# Patient Record
Sex: Female | Born: 1935
Health system: Southern US, Community
[De-identification: ages and names within clinical notes are randomized; demographics above are authoritative.]

## PROBLEM LIST (undated history)

## (undated) DIAGNOSIS — I441 Atrioventricular block, second degree: Secondary | ICD-10-CM

## (undated) DIAGNOSIS — I2510 Atherosclerotic heart disease of native coronary artery without angina pectoris: Secondary | ICD-10-CM

## (undated) DIAGNOSIS — I4891 Unspecified atrial fibrillation: Secondary | ICD-10-CM

## (undated) DIAGNOSIS — S72009A Fracture of unspecified part of neck of unspecified femur, initial encounter for closed fracture: Secondary | ICD-10-CM

## (undated) DIAGNOSIS — M1990 Unspecified osteoarthritis, unspecified site: Secondary | ICD-10-CM

## (undated) DIAGNOSIS — I10 Essential (primary) hypertension: Secondary | ICD-10-CM

## (undated) DIAGNOSIS — K529 Noninfective gastroenteritis and colitis, unspecified: Secondary | ICD-10-CM

## (undated) DIAGNOSIS — E119 Type 2 diabetes mellitus without complications: Secondary | ICD-10-CM

## (undated) DIAGNOSIS — K439 Ventral hernia without obstruction or gangrene: Secondary | ICD-10-CM

## (undated) DIAGNOSIS — K5730 Diverticulosis of large intestine without perforation or abscess without bleeding: Secondary | ICD-10-CM

## (undated) DIAGNOSIS — E785 Hyperlipidemia, unspecified: Secondary | ICD-10-CM

## (undated) DIAGNOSIS — E039 Hypothyroidism, unspecified: Secondary | ICD-10-CM

## (undated) DIAGNOSIS — C189 Malignant neoplasm of colon, unspecified: Secondary | ICD-10-CM

## (undated) HISTORY — DX: Hypothyroidism, unspecified: E03.9

## (undated) HISTORY — DX: Atrioventricular block, second degree: I44.1

## (undated) HISTORY — DX: Ventral hernia without obstruction or gangrene: K43.9

## (undated) HISTORY — DX: Atherosclerotic heart disease of native coronary artery without angina pectoris: I25.10

## (undated) HISTORY — DX: Malignant neoplasm of colon, unspecified: C18.9

## (undated) HISTORY — DX: Noninfective gastroenteritis and colitis, unspecified: K52.9

## (undated) HISTORY — DX: Type 2 diabetes mellitus without complications: E11.9

## (undated) HISTORY — DX: Unspecified atrial fibrillation: I48.91

## (undated) HISTORY — DX: Hyperlipidemia, unspecified: E78.5

## (undated) HISTORY — DX: Essential (primary) hypertension: I10

## (undated) HISTORY — PX: APPENDECTOMY: SHX54

## (undated) HISTORY — DX: Diverticulosis of large intestine without perforation or abscess without bleeding: K57.30

## (undated) HISTORY — DX: Unspecified osteoarthritis, unspecified site: M19.90

## (undated) HISTORY — PX: ROTATOR CUFF REPAIR: SHX139

---

## 1997-08-22 ENCOUNTER — Ambulatory Visit (HOSPITAL_COMMUNITY): Admission: RE | Admit: 1997-08-22 | Discharge: 1997-08-23 | Payer: Self-pay | Admitting: Cardiology

## 1997-08-22 HISTORY — PX: CARDIAC CATHETERIZATION: SHX172

## 1999-12-25 ENCOUNTER — Encounter: Payer: Self-pay | Admitting: Internal Medicine

## 1999-12-25 ENCOUNTER — Emergency Department (HOSPITAL_COMMUNITY): Admission: EM | Admit: 1999-12-25 | Discharge: 1999-12-25 | Payer: Self-pay | Admitting: Emergency Medicine

## 2000-01-10 ENCOUNTER — Encounter: Payer: Self-pay | Admitting: Orthopedic Surgery

## 2000-01-10 ENCOUNTER — Encounter: Admission: RE | Admit: 2000-01-10 | Discharge: 2000-01-10 | Payer: Self-pay | Admitting: Orthopedic Surgery

## 2000-02-13 ENCOUNTER — Encounter: Payer: Self-pay | Admitting: Orthopedic Surgery

## 2000-02-20 ENCOUNTER — Inpatient Hospital Stay (HOSPITAL_COMMUNITY): Admission: RE | Admit: 2000-02-20 | Discharge: 2000-02-22 | Payer: Self-pay | Admitting: Orthopedic Surgery

## 2002-06-23 ENCOUNTER — Ambulatory Visit (HOSPITAL_COMMUNITY): Admission: RE | Admit: 2002-06-23 | Discharge: 2002-06-23 | Payer: Self-pay | Admitting: Endocrinology

## 2005-09-10 ENCOUNTER — Encounter: Payer: Self-pay | Admitting: Vascular Surgery

## 2005-09-10 ENCOUNTER — Ambulatory Visit (HOSPITAL_COMMUNITY): Admission: RE | Admit: 2005-09-10 | Discharge: 2005-09-10 | Payer: Self-pay | Admitting: Podiatry

## 2005-11-28 ENCOUNTER — Ambulatory Visit: Payer: Self-pay | Admitting: Gastroenterology

## 2005-12-04 ENCOUNTER — Ambulatory Visit: Payer: Self-pay | Admitting: Gastroenterology

## 2005-12-22 ENCOUNTER — Ambulatory Visit: Payer: Self-pay | Admitting: Gastroenterology

## 2005-12-29 ENCOUNTER — Ambulatory Visit: Payer: Self-pay | Admitting: Gastroenterology

## 2006-01-08 ENCOUNTER — Ambulatory Visit: Payer: Self-pay | Admitting: Gastroenterology

## 2006-01-15 ENCOUNTER — Encounter (INDEPENDENT_AMBULATORY_CARE_PROVIDER_SITE_OTHER): Payer: Self-pay | Admitting: Gastroenterology

## 2006-01-15 ENCOUNTER — Ambulatory Visit: Payer: Self-pay | Admitting: Gastroenterology

## 2006-02-09 HISTORY — PX: HEMICOLECTOMY: SHX854

## 2006-02-10 ENCOUNTER — Encounter (INDEPENDENT_AMBULATORY_CARE_PROVIDER_SITE_OTHER): Payer: Self-pay | Admitting: Specialist

## 2006-02-10 ENCOUNTER — Inpatient Hospital Stay (HOSPITAL_COMMUNITY): Admission: RE | Admit: 2006-02-10 | Discharge: 2006-02-15 | Payer: Self-pay | Admitting: General Surgery

## 2007-01-08 ENCOUNTER — Ambulatory Visit: Payer: Self-pay | Admitting: Gastroenterology

## 2007-02-17 ENCOUNTER — Ambulatory Visit: Payer: Self-pay | Admitting: Gastroenterology

## 2007-02-17 DIAGNOSIS — K5730 Diverticulosis of large intestine without perforation or abscess without bleeding: Secondary | ICD-10-CM | POA: Insufficient documentation

## 2007-05-04 ENCOUNTER — Inpatient Hospital Stay (HOSPITAL_COMMUNITY): Admission: RE | Admit: 2007-05-04 | Discharge: 2007-05-10 | Payer: Self-pay | Admitting: Orthopedic Surgery

## 2007-07-30 ENCOUNTER — Encounter: Admission: RE | Admit: 2007-07-30 | Discharge: 2007-07-30 | Payer: Self-pay | Admitting: General Surgery

## 2007-08-10 DIAGNOSIS — K648 Other hemorrhoids: Secondary | ICD-10-CM | POA: Insufficient documentation

## 2007-08-10 DIAGNOSIS — E785 Hyperlipidemia, unspecified: Secondary | ICD-10-CM

## 2007-08-10 DIAGNOSIS — M1990 Unspecified osteoarthritis, unspecified site: Secondary | ICD-10-CM

## 2007-08-10 DIAGNOSIS — I10 Essential (primary) hypertension: Secondary | ICD-10-CM | POA: Insufficient documentation

## 2007-08-13 ENCOUNTER — Encounter: Admission: RE | Admit: 2007-08-13 | Discharge: 2007-08-13 | Payer: Self-pay | Admitting: Orthopedic Surgery

## 2007-09-14 ENCOUNTER — Inpatient Hospital Stay (HOSPITAL_COMMUNITY): Admission: AD | Admit: 2007-09-14 | Discharge: 2007-09-16 | Payer: Self-pay | Admitting: Orthopedic Surgery

## 2008-06-29 ENCOUNTER — Inpatient Hospital Stay (HOSPITAL_COMMUNITY): Admission: EM | Admit: 2008-06-29 | Discharge: 2008-07-07 | Payer: Self-pay | Admitting: Emergency Medicine

## 2008-06-29 ENCOUNTER — Encounter: Payer: Self-pay | Admitting: Cardiology

## 2008-06-29 ENCOUNTER — Ambulatory Visit: Payer: Self-pay | Admitting: Internal Medicine

## 2008-06-29 HISTORY — PX: TRANSTHORACIC ECHOCARDIOGRAM: SHX275

## 2008-07-03 HISTORY — PX: CORONARY ANGIOPLASTY: SHX604

## 2008-07-06 HISTORY — PX: PACEMAKER INSERTION: SHX728

## 2008-07-13 ENCOUNTER — Encounter: Payer: Self-pay | Admitting: Internal Medicine

## 2008-07-17 ENCOUNTER — Ambulatory Visit: Payer: Self-pay

## 2008-07-27 ENCOUNTER — Encounter (HOSPITAL_COMMUNITY): Admission: RE | Admit: 2008-07-27 | Discharge: 2008-10-25 | Payer: Self-pay | Admitting: Cardiology

## 2008-09-28 DIAGNOSIS — R55 Syncope and collapse: Secondary | ICD-10-CM

## 2008-09-28 DIAGNOSIS — I2510 Atherosclerotic heart disease of native coronary artery without angina pectoris: Secondary | ICD-10-CM | POA: Insufficient documentation

## 2008-09-28 DIAGNOSIS — K439 Ventral hernia without obstruction or gangrene: Secondary | ICD-10-CM | POA: Insufficient documentation

## 2008-09-28 DIAGNOSIS — Z9861 Coronary angioplasty status: Secondary | ICD-10-CM

## 2008-09-29 ENCOUNTER — Ambulatory Visit: Payer: Self-pay | Admitting: Internal Medicine

## 2008-11-20 ENCOUNTER — Telehealth: Payer: Self-pay | Admitting: Gastroenterology

## 2008-11-23 ENCOUNTER — Ambulatory Visit: Payer: Self-pay | Admitting: Gastroenterology

## 2008-11-23 DIAGNOSIS — R197 Diarrhea, unspecified: Secondary | ICD-10-CM

## 2008-11-23 LAB — CONVERTED CEMR LAB
ALT: 17 units/L (ref 0–35)
AST: 22 units/L (ref 0–37)
Basophils Absolute: 0 10*3/uL (ref 0.0–0.1)
Bilirubin, Direct: 0.1 mg/dL (ref 0.0–0.3)
Chloride: 103 meq/L (ref 96–112)
Eosinophils Absolute: 0.1 10*3/uL (ref 0.0–0.7)
Iron: 51 ug/dL (ref 42–145)
Lymphocytes Relative: 22.3 % (ref 12.0–46.0)
MCHC: 34.7 g/dL (ref 30.0–36.0)
Neutrophils Relative %: 66.6 % (ref 43.0–77.0)
Platelets: 244 10*3/uL (ref 150.0–400.0)
Potassium: 4 meq/L (ref 3.5–5.1)
RBC: 3.44 M/uL — ABNORMAL LOW (ref 3.87–5.11)
RDW: 14.5 % (ref 11.5–14.6)
Sodium: 138 meq/L (ref 135–145)
Total Bilirubin: 0.8 mg/dL (ref 0.3–1.2)
Transferrin: 286.7 mg/dL (ref 212.0–360.0)
Vitamin B-12: 319 pg/mL (ref 211–911)

## 2008-11-24 ENCOUNTER — Encounter: Payer: Self-pay | Admitting: Gastroenterology

## 2008-11-28 ENCOUNTER — Telehealth: Payer: Self-pay | Admitting: Gastroenterology

## 2008-11-30 ENCOUNTER — Telehealth: Payer: Self-pay | Admitting: Gastroenterology

## 2008-11-30 ENCOUNTER — Ambulatory Visit: Payer: Self-pay | Admitting: Internal Medicine

## 2008-12-01 ENCOUNTER — Encounter: Payer: Self-pay | Admitting: Gastroenterology

## 2008-12-01 ENCOUNTER — Ambulatory Visit: Payer: Self-pay | Admitting: Gastroenterology

## 2008-12-01 ENCOUNTER — Ambulatory Visit (HOSPITAL_COMMUNITY): Admission: RE | Admit: 2008-12-01 | Discharge: 2008-12-01 | Payer: Self-pay | Admitting: Gastroenterology

## 2008-12-05 ENCOUNTER — Telehealth: Payer: Self-pay | Admitting: Gastroenterology

## 2008-12-06 ENCOUNTER — Ambulatory Visit: Payer: Self-pay | Admitting: Gastroenterology

## 2008-12-06 ENCOUNTER — Telehealth: Payer: Self-pay | Admitting: Gastroenterology

## 2008-12-06 DIAGNOSIS — K5289 Other specified noninfective gastroenteritis and colitis: Secondary | ICD-10-CM

## 2008-12-07 ENCOUNTER — Telehealth: Payer: Self-pay | Admitting: Gastroenterology

## 2009-03-09 ENCOUNTER — Inpatient Hospital Stay (HOSPITAL_COMMUNITY): Admission: AD | Admit: 2009-03-09 | Discharge: 2009-03-15 | Payer: Self-pay | Admitting: General Surgery

## 2009-04-18 ENCOUNTER — Emergency Department (HOSPITAL_COMMUNITY): Admission: EM | Admit: 2009-04-18 | Discharge: 2009-04-18 | Payer: Self-pay | Admitting: Emergency Medicine

## 2009-04-25 ENCOUNTER — Emergency Department (HOSPITAL_COMMUNITY): Admission: EM | Admit: 2009-04-25 | Discharge: 2009-04-25 | Payer: Self-pay | Admitting: Emergency Medicine

## 2009-11-30 ENCOUNTER — Encounter (INDEPENDENT_AMBULATORY_CARE_PROVIDER_SITE_OTHER): Payer: Self-pay | Admitting: *Deleted

## 2010-01-17 ENCOUNTER — Ambulatory Visit: Payer: Self-pay | Admitting: Cardiology

## 2010-02-14 ENCOUNTER — Ambulatory Visit: Payer: Self-pay | Admitting: Internal Medicine

## 2010-06-13 NOTE — Assessment & Plan Note (Signed)
Summary: MDT PACER CHECK  Medications Added BENICAR 40 MG TABS (OLMESARTAN MEDOXOMIL) Take 1 tablet by mouth once daily      Allergies Added:   Current Medications (verified): 1)  Actos 15 Mg Tabs (Pioglitazone Hcl) .Marland Kitchen.. 1 By Mouth Once Daily 2)  Aspirin 81 Mg Tabs (Aspirin) .Marland Kitchen.. 1 Tablet By Mouth Once Daily 3)  Fish Oil   Oil (Fish Oil) .Marland Kitchen.. 1 By Mouth Once Daily 4)  Plavix 75 Mg Tabs (Clopidogrel Bisulfate) .... Take One Tablet By Mouth Daily 5)  Furosemide 40 Mg Tabs (Furosemide) .... Take 1 and 1/2  Tablet By Mouth Daily. 6)  Synthroid 112 Mcg Tabs (Levothyroxine Sodium) .Marland Kitchen.. 1 Tablet By Mouth Once Daily 7)  Nitroglycerin 0.4 Mg Subl (Nitroglycerin) .... One Tablet Under Tongue Every 5 Minutes As Needed For Chest Pain---May Repeat Times Three 8)  Simvastatin 40 Mg Tabs (Simvastatin) .... Take One Tablet By Mouth Daily At Bedtime 9)  Xifaxan 550mg   Tabs (Rifaximin) .... Take One By Mouth Two Times A Day 10)  Align   Caps (Misc Intestinal Flora Regulat) .... Take One Capsule By Mouth Daily 11)  Entocort Ec 3 Mg Xr24h-Cap (Budesonide) .... Take 3 Tabs Once A Day 12)  Benicar 40 Mg Tabs (Olmesartan Medoxomil) .... Take 1 Tablet By Mouth Once Daily  Allergies (verified): 1)  ! Motrin    PPM Specifications Following MD:  Hillis Range, MD     PPM Vendor:  Medtronic     PPM Model Number:  [id number]     PPM Serial Number:  [id number] PPM DOI:  07/06/2008     PPM Implanting MD:  Hillis Range, MD  Lead 1    Location: RA     DOI: 07/06/2008     Model #: [id number]     Serial #: [id number]     Status: active Lead 2    Location: RV     DOI: 07/06/2008     Model #: [id number]     Serial #: [id number]     Status: active  Magnet Response Rate:  BOL 85 ERI  65  Indications:  Mobitz I   PPM Follow Up Battery Voltage:  2.79 V     Battery Est. Longevity:  9 yrs       PPM Device Measurements Atrium  Amplitude: 5.60 mV, Impedance: 435 ohms, Threshold: 1.00 V at 0.40 msec Right Ventricle  Amplitude: 22.40  mV, Impedance: 576 ohms, Threshold: 1.00 V at 0.40 msec  Episodes MS Episodes:  8     Percent Mode Switch:  0.1%     Coumadin:  No Ventricular High Rate:  0     Atrial Pacing:  25.3%     Ventricular Pacing:  99.9%  Parameters Mode:  DDD     Lower Rate Limit:  60     Upper Rate Limit:  130 Paced AV Delay:  200     Sensed AV Delay:  180 Rate Response Parameters:  good heart rate historgram distribution Next Cardiology Appt Due:  08/12/2010 Tech Comments:  8 MODE SWITCHES--LONGEST WAS 5 HRS 49 MINUTES. -COUMADIN.  NORMAL DEVICE FUNCTION.  NO CHANGES MADE. ROV IN 6 MTHS W/JA. Vella Kohler  February 14, 2010 1:38 PM  Appended Document: MDT PACER CHECK given advanced age and colitis, I am reluctant to start coumadin for afib. We will discuss upon return.

## 2010-06-13 NOTE — Letter (Signed)
Summary: Device-Delinquent Check  [facility], Main Office  [address]   [place], [place] [address]   Phone: [phone]  Fax: [phone]     November 30, 2009 MRN: [id number]   [facility] [address] [place], [place]  [address]   Dear Ms. KETNER,  According to our records, you have not had your implanted device checked with Dr. Johney Frame in the recommended period of time.  We are unable to determine appropriate device function without checking your device on a regular basis.  Please call our office to schedule an appointment as soon as possible.  If you are having your device checked by another physician, please call us so that we may update our records.  Thank you,   [facility]

## 2010-06-13 NOTE — Cardiovascular Report (Signed)
Summary: Office Visit   Office Visit   Imported By: Roderic Ovens 02/21/2010 15:48:22  _____________________________________________________________________  External Attachment:    Type:   Image     Comment:   External Document

## 2010-07-31 ENCOUNTER — Encounter: Payer: Self-pay | Admitting: Internal Medicine

## 2010-08-12 ENCOUNTER — Ambulatory Visit (INDEPENDENT_AMBULATORY_CARE_PROVIDER_SITE_OTHER): Payer: Medicare Other | Admitting: Internal Medicine

## 2010-08-12 ENCOUNTER — Encounter: Payer: Self-pay | Admitting: Internal Medicine

## 2010-08-12 DIAGNOSIS — I2510 Atherosclerotic heart disease of native coronary artery without angina pectoris: Secondary | ICD-10-CM

## 2010-08-12 DIAGNOSIS — R55 Syncope and collapse: Secondary | ICD-10-CM

## 2010-08-12 DIAGNOSIS — I441 Atrioventricular block, second degree: Secondary | ICD-10-CM

## 2010-08-12 MED ORDER — NITROGLYCERIN 0.6 MG SL SUBL: 0.6000 mg | SUBLINGUAL_TABLET | SUBLINGUAL | Status: DC | PRN

## 2010-08-12 NOTE — Assessment & Plan Note (Signed)
Normal pacemaker function See [name] report No changes today  She has had afib documented by pacemaker, longest episode 8 hours. We will consider coumadin if afib burden persists

## 2010-08-12 NOTE — Assessment & Plan Note (Signed)
Stable Salt restriction No changes

## 2010-08-12 NOTE — Patient Instructions (Signed)
Your physician wants you to follow-up in: 6 months in the device clinic and 12 months with Dr Allred You will receive a reminder letter in the mail two months in advance. If you don't receive a letter, please call our office to schedule the follow-up appointment.  

## 2010-08-12 NOTE — Progress Notes (Signed)
The patient presents today for routine electrophysiology followup.  Since last being seen in our clinic, she reports doing very well.  She remains quite active.  Today, she denies symptoms of palpitations, chest pain, shortness of breath, orthopnea, PND, lower extremity edema, dizziness, presyncope, syncope, or neurologic sequela.  The patient feels that she is tolerating medications without difficulties and is otherwise without complaint today.   Past Medical History  Diagnosis Date  . Colitis   . Colon cancer   . Diarrhea   . Hypothyroidism   . Ventral hernia   . Mobitz (type) II atrioventricular block     2nd degree  . CAD (coronary artery disease)   . Syncopal episodes   . Diverticulosis of colon   . HLD (hyperlipidemia)   . DJD (degenerative joint disease)   . DM (diabetes mellitus)   . HTN (hypertension)    Past Surgical History  Procedure Date  . Appendectomy   . Hemicolectomy 10/07    right  . Acromionectomy with repair   . Pacemaker insertion     Current outpatient prescriptions:aspirin 81 MG tablet, Take 81 mg by mouth daily.  , Disp: , Rfl: ;  Calcium Carbonate (CALTRATE 600) 1500 MG TABS, Take 1 tablet by mouth daily.  , Disp: , Rfl: ;  fish oil-omega-3 fatty acids 1000 MG capsule, Take 2 g by mouth daily.  , Disp: , Rfl: ;  furosemide (LASIX) 40 MG tablet, Take 40 mg by mouth daily. , Disp: , Rfl:  levothyroxine (SYNTHROID, LEVOTHROID) 112 MCG tablet, Take 112 mcg by mouth daily.  , Disp: , Rfl: ;  olmesartan (BENICAR) 40 MG tablet, Take 40 mg by mouth daily.  , Disp: , Rfl: ;  pioglitazone (ACTOS) 15 MG tablet, Take 15 mg by mouth daily.  , Disp: , Rfl: ;  Probiotic Product (ALIGN PO), Take 1 capsule by mouth daily.  , Disp: , Rfl: ;  pyridoxine (B-6) 100 MG tablet, Take 100 mg by mouth daily.  , Disp: , Rfl:  simvastatin (ZOCOR) 40 MG tablet, Take 40 mg by mouth at bedtime.  , Disp: , Rfl: ;  DISCONTD: nitroGLYCERIN (NITROSTAT) 0.4 MG SL tablet, Place 0.4 mg under the  tongue every 5 (five) minutes as needed.  , Disp: , Rfl: ;  nitroGLYCERIN (NITROSTAT) 0.6 MG SL tablet, Place 1 tablet (0.6 mg total) under the tongue every 5 (five) minutes as needed for chest pain., Disp: 90 tablet, Rfl: 12 DISCONTD: budesonide (ENTOCORT EC) 3 MG 24 hr capsule, Take 9 mg by mouth every morning.  , Disp: , Rfl: ;  DISCONTD: clopidogrel (PLAVIX) 75 MG tablet, Take 75 mg by mouth daily.  , Disp: , Rfl: ;  DISCONTD: NON FORMULARY, Take 550 mg by mouth 2 (two) times daily. XIFAXAN , Disp: , Rfl:   Allergies  Allergen Reactions  . Hytrin (Terazosin Hcl)   . Ibuprofen   . Norvasc (Amlodipine Besylate)   . Plavix (Clopidogrel Bisulfate)     History   Social History  . Marital Status: Widowed    Spouse Name: N/A    Number of Children: N/A  . Years of Education: N/A   Occupational History  . Not on file.   Social History Main Topics  . Smoking status: Never Smoker   . Smokeless tobacco: Not on file  . Alcohol Use: No  . Drug Use: No  . Sexually Active: Not on file   Other Topics Concern  . Not on file  Social History Narrative          She is a widow.  She lives by herself.  She did have 2        children who both died at young age.  Both of the children were invalids        from birth because of birth injuries but lived to the ages of 24 and 74        respectivel    Family History  Problem Relation Age of Onset  . Heart attack Father    Physical Exam: Filed Vitals:   08/12/10 1052  BP: 142/58  Pulse: 72  Height: 5\' 2"  (1.575 m)  Weight: 164 lb (74.39 kg)    GEN- The patient is thin elderly female, alert and oriented x 3 today.   Head- normocephalic, atraumatic Eyes-  Sclera clear, conjunctiva pink Ears- hearing intact Oropharynx- clear Neck- supple, no JVP Lymph- no cervical lymphadenopathy Lungs- Clear to ausculation bilaterally, normal work of breathing Chest- pacemaker pocket is well healed Heart- Regular rate and rhythm, no murmurs, rubs or  gallops, PMI not laterally displaced GI- soft, NT, ND, + BS Extremities- no clubbing, cyanosis, or edema MS- no significant deformity or atrophy Skin- no rash or lesion Psych- euthymic mood, full affect Neuro- strength and sensation are intact

## 2010-08-12 NOTE — Assessment & Plan Note (Signed)
Stable Continue ASA No changes today Nitroglycerine given for prn use

## 2010-08-14 LAB — COMPREHENSIVE METABOLIC PANEL
AST: 20 U/L (ref 0–37)
Albumin: 2.8 g/dL — ABNORMAL LOW (ref 3.5–5.2)
Alkaline Phosphatase: 74 U/L (ref 39–117)
Chloride: 101 mEq/L (ref 96–112)
Creatinine, Ser: 0.71 mg/dL (ref 0.4–1.2)
GFR calc Af Amer: >60 mL/min (ref >60)
Potassium: 3.5 mEq/L (ref 3.5–5.1)
Total Bilirubin: 1.3 mg/dL — ABNORMAL HIGH (ref 0.3–1.2)

## 2010-08-14 LAB — GLUCOSE, CAPILLARY
Glucose-Capillary: 101 mg/dL — ABNORMAL HIGH (ref 70–99)
Glucose-Capillary: 104 mg/dL — ABNORMAL HIGH (ref 70–99)
Glucose-Capillary: 124 mg/dL — ABNORMAL HIGH (ref 70–99)
Glucose-Capillary: 139 mg/dL — ABNORMAL HIGH (ref 70–99)

## 2010-08-14 LAB — CLOSTRIDIUM DIFFICILE EIA
C difficile Toxins A+B, EIA: NEGATIVE
C difficile Toxins A+B, EIA: NEGATIVE

## 2010-08-14 LAB — CBC
Platelets: 220 10*3/uL (ref 150–400)
WBC: 3.5 10*3/uL — ABNORMAL LOW (ref 4.0–10.5)

## 2010-08-15 LAB — GLUCOSE, CAPILLARY
Glucose-Capillary: 107 mg/dL — ABNORMAL HIGH (ref 70–99)
Glucose-Capillary: 113 mg/dL — ABNORMAL HIGH (ref 70–99)
Glucose-Capillary: 119 mg/dL — ABNORMAL HIGH (ref 70–99)
Glucose-Capillary: 121 mg/dL — ABNORMAL HIGH (ref 70–99)
Glucose-Capillary: 128 mg/dL — ABNORMAL HIGH (ref 70–99)
Glucose-Capillary: 131 mg/dL — ABNORMAL HIGH (ref 70–99)
Glucose-Capillary: 144 mg/dL — ABNORMAL HIGH (ref 70–99)
Glucose-Capillary: 150 mg/dL — ABNORMAL HIGH (ref 70–99)
Glucose-Capillary: 151 mg/dL — ABNORMAL HIGH (ref 70–99)
Glucose-Capillary: 86 mg/dL (ref 70–99)
Glucose-Capillary: 99 mg/dL (ref 70–99)

## 2010-08-15 LAB — COMPREHENSIVE METABOLIC PANEL
ALT: 18 U/L (ref 0–35)
Alkaline Phosphatase: 84 U/L (ref 39–117)
BUN: 15 mg/dL (ref 6–23)
Chloride: 103 mEq/L (ref 96–112)
Glucose, Bld: 165 mg/dL — ABNORMAL HIGH (ref 70–99)
Potassium: 4.3 mEq/L (ref 3.5–5.1)
Total Bilirubin: 0.9 mg/dL (ref 0.3–1.2)

## 2010-08-15 LAB — URINALYSIS, ROUTINE W REFLEX MICROSCOPIC
Bilirubin Urine: NEGATIVE
Glucose, UA: NEGATIVE mg/dL
Hgb urine dipstick: NEGATIVE
Ketones, ur: NEGATIVE mg/dL
Nitrite: NEGATIVE
Protein, ur: NEGATIVE mg/dL
Specific Gravity, Urine: 1.008 (ref 1.005–1.030)
Urobilinogen, UA: 0.2 mg/dL (ref 0.0–1.0)
pH: 6 (ref 5.0–8.0)

## 2010-08-15 LAB — CBC
HCT: 33.9 % — ABNORMAL LOW (ref 36.0–46.0)
Hemoglobin: 11.3 g/dL — ABNORMAL LOW (ref 12.0–15.0)
MCHC: 33.4 g/dL (ref 30.0–36.0)
MCV: 90.6 fL (ref 78.0–100.0)
Platelets: 253 K/uL (ref 150–400)
RBC: 3.74 MIL/uL — ABNORMAL LOW (ref 3.87–5.11)
RDW: 13.7 % (ref 11.5–15.5)
WBC: 7.8 K/uL (ref 4.0–10.5)

## 2010-08-15 LAB — DIFFERENTIAL
Basophils Absolute: 0 K/uL (ref 0.0–0.1)
Basophils Relative: 1 % (ref 0–1)
Eosinophils Absolute: 0.2 K/uL (ref 0.0–0.7)
Eosinophils Relative: 3 % (ref 0–5)
Lymphocytes Relative: 20 % (ref 12–46)
Lymphs Abs: 1.6 K/uL (ref 0.7–4.0)
Monocytes Absolute: 0.4 K/uL (ref 0.1–1.0)
Monocytes Relative: 5 % (ref 3–12)
Neutro Abs: 5.6 K/uL (ref 1.7–7.7)
Neutrophils Relative %: 72 % (ref 43–77)

## 2010-08-18 LAB — GLUCOSE, CAPILLARY: Glucose-Capillary: 120 mg/dL — ABNORMAL HIGH (ref 70–99)

## 2010-08-22 LAB — GLUCOSE, CAPILLARY
Glucose-Capillary: 197 mg/dL — ABNORMAL HIGH (ref 70–99)
Glucose-Capillary: 98 mg/dL (ref 70–99)

## 2010-08-27 LAB — CBC
HCT: 26.1 % — ABNORMAL LOW (ref 36.0–46.0)
HCT: 27 % — ABNORMAL LOW (ref 36.0–46.0)
HCT: 29 % — ABNORMAL LOW (ref 36.0–46.0)
HCT: 30.7 % — ABNORMAL LOW (ref 36.0–46.0)
HCT: 31.3 % — ABNORMAL LOW (ref 36.0–46.0)
HCT: 33.6 % — ABNORMAL LOW (ref 36.0–46.0)
Hemoglobin: 10 g/dL — ABNORMAL LOW (ref 12.0–15.0)
Hemoglobin: 10.7 g/dL — ABNORMAL LOW (ref 12.0–15.0)
Hemoglobin: 11 g/dL — ABNORMAL LOW (ref 12.0–15.0)
Hemoglobin: 11.6 g/dL — ABNORMAL LOW (ref 12.0–15.0)
Hemoglobin: 9 g/dL — ABNORMAL LOW (ref 12.0–15.0)
Hemoglobin: 9.4 g/dL — ABNORMAL LOW (ref 12.0–15.0)
MCHC: 34.5 g/dL (ref 30.0–36.0)
MCHC: 34.7 g/dL (ref 30.0–36.0)
MCV: 89.8 fL (ref 78.0–100.0)
MCV: 90.2 fL (ref 78.0–100.0)
Platelets: 176 10*3/uL (ref 150–400)
Platelets: 193 10*3/uL (ref 150–400)
Platelets: 200 10*3/uL (ref 150–400)
Platelets: 212 10*3/uL (ref 150–400)
Platelets: 214 10*3/uL (ref 150–400)
RBC: 2.87 MIL/uL — ABNORMAL LOW (ref 3.87–5.11)
RBC: 2.98 MIL/uL — ABNORMAL LOW (ref 3.87–5.11)
RBC: 3.2 MIL/uL — ABNORMAL LOW (ref 3.87–5.11)
RBC: 3.28 MIL/uL — ABNORMAL LOW (ref 3.87–5.11)
RBC: 3.49 MIL/uL — ABNORMAL LOW (ref 3.87–5.11)
RBC: 3.73 MIL/uL — ABNORMAL LOW (ref 3.87–5.11)
RDW: 13.3 % (ref 11.5–15.5)
RDW: 13.5 % (ref 11.5–15.5)
RDW: 13.7 % (ref 11.5–15.5)
RDW: 13.8 % (ref 11.5–15.5)
RDW: 14 % (ref 11.5–15.5)
WBC: 5.4 10*3/uL (ref 4.0–10.5)
WBC: 5.6 10*3/uL (ref 4.0–10.5)
WBC: 5.7 10*3/uL (ref 4.0–10.5)
WBC: 5.9 10*3/uL (ref 4.0–10.5)
WBC: 6.2 10*3/uL (ref 4.0–10.5)
WBC: 6.3 10*3/uL (ref 4.0–10.5)
WBC: 6.4 10*3/uL (ref 4.0–10.5)
WBC: 6.6 10*3/uL (ref 4.0–10.5)
WBC: 8.6 10*3/uL (ref 4.0–10.5)

## 2010-08-27 LAB — FERRITIN: Ferritin: 19 ng/mL (ref 10–291)

## 2010-08-27 LAB — BASIC METABOLIC PANEL
BUN: 12 mg/dL (ref 6–23)
BUN: 15 mg/dL (ref 6–23)
BUN: 15 mg/dL (ref 6–23)
BUN: 21 mg/dL (ref 6–23)
BUN: 29 mg/dL — ABNORMAL HIGH (ref 6–23)
CO2: 24 mEq/L (ref 19–32)
Calcium: 8.6 mg/dL (ref 8.4–10.5)
Calcium: 8.9 mg/dL (ref 8.4–10.5)
Chloride: 104 mEq/L (ref 96–112)
Chloride: 108 mEq/L (ref 96–112)
Creatinine, Ser: 0.78 mg/dL (ref 0.4–1.2)
GFR calc Af Amer: >60 mL/min (ref >60)
GFR calc non Af Amer: 55 mL/min — ABNORMAL LOW (ref >60)
GFR calc non Af Amer: >60 mL/min (ref >60)
GFR calc non Af Amer: >60 mL/min (ref >60)
Glucose, Bld: 114 mg/dL — ABNORMAL HIGH (ref 70–99)
Glucose, Bld: 119 mg/dL — ABNORMAL HIGH (ref 70–99)
Potassium: 3.6 mEq/L (ref 3.5–5.1)
Potassium: 3.7 mEq/L (ref 3.5–5.1)
Potassium: 3.7 mEq/L (ref 3.5–5.1)
Potassium: 3.8 mEq/L (ref 3.5–5.1)
Potassium: 3.8 mEq/L (ref 3.5–5.1)
Sodium: 136 mEq/L (ref 135–145)
Sodium: 137 mEq/L (ref 135–145)

## 2010-08-27 LAB — COMPREHENSIVE METABOLIC PANEL
AST: 20 U/L (ref 0–37)
Albumin: 3.3 g/dL — ABNORMAL LOW (ref 3.5–5.2)
Alkaline Phosphatase: 80 U/L (ref 39–117)
BUN: 20 mg/dL (ref 6–23)
CO2: 27 mEq/L (ref 19–32)
Chloride: 104 mEq/L (ref 96–112)
GFR calc Af Amer: >60 mL/min (ref >60)
GFR calc non Af Amer: >60 mL/min (ref >60)
Potassium: 3.5 mEq/L (ref 3.5–5.1)
Total Bilirubin: 0.6 mg/dL (ref 0.3–1.2)

## 2010-08-27 LAB — PROTIME-INR
INR: 1.1 (ref 0.00–1.49)
Prothrombin Time: 13.7 seconds (ref 11.6–15.2)
Prothrombin Time: 14.9 seconds (ref 11.6–15.2)

## 2010-08-27 LAB — GLUCOSE, CAPILLARY
Glucose-Capillary: 103 mg/dL — ABNORMAL HIGH (ref 70–99)
Glucose-Capillary: 125 mg/dL — ABNORMAL HIGH (ref 70–99)
Glucose-Capillary: 128 mg/dL — ABNORMAL HIGH (ref 70–99)
Glucose-Capillary: 158 mg/dL — ABNORMAL HIGH (ref 70–99)
Glucose-Capillary: 159 mg/dL — ABNORMAL HIGH (ref 70–99)
Glucose-Capillary: 162 mg/dL — ABNORMAL HIGH (ref 70–99)
Glucose-Capillary: 205 mg/dL — ABNORMAL HIGH (ref 70–99)
Glucose-Capillary: 89 mg/dL (ref 70–99)
Glucose-Capillary: 97 mg/dL (ref 70–99)

## 2010-08-27 LAB — URINE CULTURE
Colony Count: 50000
Colony Count: >=100000

## 2010-08-27 LAB — LIPID PANEL
Cholesterol: 156 mg/dL (ref 0–200)
LDL Cholesterol: 94 mg/dL (ref 0–99)

## 2010-08-27 LAB — CK TOTAL AND CKMB (NOT AT ARMC)
CK, MB: 2.2 ng/mL (ref 0.3–4.0)
Relative Index: INVALID (ref 0.0–2.5)
Total CK: 80 U/L (ref 7–177)

## 2010-08-27 LAB — POCT CARDIAC MARKERS
CKMB, poc: 1.1 ng/mL (ref 1.0–8.0)
Myoglobin, poc: 191 ng/mL (ref 12–200)
Troponin i, poc: <0.05 ng/mL (ref 0.00–0.09)

## 2010-08-27 LAB — URINALYSIS, ROUTINE W REFLEX MICROSCOPIC
Bilirubin Urine: NEGATIVE
Glucose, UA: NEGATIVE mg/dL
Ketones, ur: NEGATIVE mg/dL
Nitrite: NEGATIVE
Specific Gravity, Urine: 1.012 (ref 1.005–1.030)
pH: 6.5 (ref 5.0–8.0)

## 2010-08-27 LAB — DIFFERENTIAL
Basophils Absolute: 0 10*3/uL (ref 0.0–0.1)
Eosinophils Relative: 1 % (ref 0–5)
Lymphocytes Relative: 13 % (ref 12–46)
Lymphs Abs: 1.1 10*3/uL (ref 0.7–4.0)
Monocytes Absolute: 0.4 10*3/uL (ref 0.1–1.0)
Neutro Abs: 7 10*3/uL (ref 1.7–7.7)

## 2010-08-27 LAB — TSH: TSH: 2.556 u[IU]/mL (ref 0.350–4.500)

## 2010-08-27 LAB — FOLATE: Folate: >20 ng/mL

## 2010-08-27 LAB — HEPARIN LEVEL (UNFRACTIONATED)
Heparin Unfractionated: 0.31 IU/mL (ref 0.30–0.70)
Heparin Unfractionated: 0.34 IU/mL (ref 0.30–0.70)
Heparin Unfractionated: 0.48 IU/mL (ref 0.30–0.70)

## 2010-08-27 LAB — POCT I-STAT, CHEM 8
Creatinine, Ser: 0.9 mg/dL (ref 0.4–1.2)
Glucose, Bld: 192 mg/dL — ABNORMAL HIGH (ref 70–99)
HCT: 35 % — ABNORMAL LOW (ref 36.0–46.0)
Hemoglobin: 11.9 g/dL — ABNORMAL LOW (ref 12.0–15.0)
TCO2: 27 mmol/L (ref 0–100)

## 2010-08-27 LAB — APTT: aPTT: 39 seconds — ABNORMAL HIGH (ref 24–37)

## 2010-08-27 LAB — BRAIN NATRIURETIC PEPTIDE: Pro B Natriuretic peptide (BNP): 345 pg/mL — ABNORMAL HIGH (ref 0.0–100.0)

## 2010-08-27 LAB — CARDIAC PANEL(CRET KIN+CKTOT+MB+TROPI)
Relative Index: INVALID (ref 0.0–2.5)
Relative Index: INVALID (ref 0.0–2.5)
Total CK: 84 U/L (ref 7–177)

## 2010-08-27 LAB — HEMOGLOBIN A1C
Hgb A1c MFr Bld: 6.5 % — ABNORMAL HIGH (ref 4.6–6.1)
Mean Plasma Glucose: 140 mg/dL

## 2010-08-27 LAB — TROPONIN I: Troponin I: 0.08 ng/mL — ABNORMAL HIGH (ref 0.00–0.06)

## 2010-09-05 ENCOUNTER — Ambulatory Visit (INDEPENDENT_AMBULATORY_CARE_PROVIDER_SITE_OTHER): Payer: Medicare Other

## 2010-09-05 ENCOUNTER — Inpatient Hospital Stay (INDEPENDENT_AMBULATORY_CARE_PROVIDER_SITE_OTHER)
Admission: RE | Admit: 2010-09-05 | Discharge: 2010-09-05 | Disposition: A | Discharge status: Home / Self Care | Payer: Medicare Other | Source: Ambulatory Visit | Attending: Family Medicine | Admitting: Family Medicine

## 2010-09-05 DIAGNOSIS — M1990 Unspecified osteoarthritis, unspecified site: Secondary | ICD-10-CM

## 2010-09-24 NOTE — Consult Note (Signed)
NAME:  TRAVIS, PURK NO.:  [id number]   MEDICAL RECORD NO.:  [id number]          PATIENT TYPE:  INP   LOCATION:  1444                         FACILITY:  [facility]   PHYSICIAN:  Peter M. Swaziland, M.D.  DATE OF BIRTH:  Aug 31, 1916   DATE OF CONSULTATION:  DATE OF DISCHARGE:                                 CONSULTATION   HISTORY OF PRESENT ILLNESS:  Ms. Biggers is very pleasant 77 year old  white female who underwent recurrent rotator cuff surgery on Sep 14, 2007.  She tolerated this very well.  She had had prior surgery in the  same shoulder back in December 2008.  Postoperatively she has done very  well.  She denies any significant pain, nausea or vomiting, chest pain,  shortness of breath, palpitations, dizziness or any other cardiac  complaint.  On the monitor, however, she has been noted to have  intermittent second-degree AV block Mobitz type 1 with bradycardia.  Her  longest pause is 2 seconds.  She did have transient 2:1 AV block with  heart rate down to 30.  All of these arrhythmias were asymptomatic.  Ms.  Marquina has been on no AV nodal blocking agents.  She has had her thyroid  checked recently by Dr. Dagoberto Ligas and was therapeutic.   PAST MEDICAL HISTORY:  1. History of coronary artery disease with remote stenting of the      right coronary artery in 1999.  2. Hypertension.  3. Diabetes mellitus type 2.  4. Hyperlipidemia.  5. Hypothyroidism.  6. Recurrent rotator cuff tear.  7. Other surgeries include previous sinus surgery, polypectomy,      appendectomy, previous cataract surgery.   She is intolerant of Motrin.   CURRENT MEDICATIONS:  1. Insulin on a sliding scale.  2. Colace 100 mg b.i.d.  3. Senokot 1 tablet b.i.d.  4. Avapro 150 mg per day.  5. Zocor 40 mg per day.  6. Aspirin 81 mg per day.  7. Actos 15 mg per day.  8. Lasix 40 mg daily.  9. Synthroid 88 mcg per day.  10.Pain medications p.r.n.   SOCIAL HISTORY:  The patient lives alone.  She  is independent.  She is  widowed.  She denies tobacco or alcohol use.   FAMILY HISTORY:  Noncontributory.   PHYSICAL EXAM:  The patient is a very pleasant, elderly white female in  no distress.  Her blood pressure is 126/56, her pulse is 65 and regular, saturations  are 97% on room air.  She is afebrile.  HEENT:  Unremarkable.  She has no jugular venous distention, adenopathy  or bruits.  She has no thyromegaly.  LUNGS:  Clear to auscultation and percussion.  CARDIAC:  A regular rate and rhythm with a soft systolic ejection murmur  in the upper right sternal border.  There is no S3.  ABDOMEN:  Soft, nontender without masses or bruits.  LOWER EXTREMITIES:  Without edema.  Pedal pulses are 2+ and symmetric.  NEUROLOGIC:  Nonfocal.   LABORATORY DATA:  ECG shows normal sinus rhythm with first-degree AV  block and nonspecific  ST abnormality.  Sodium is 138, potassium 3.8,  chloride 101, CO2 29, BUN 20, creatinine 0.68, glucose of 141.  Hemoglobin was 12.3.   IMPRESSION:  1. Intermittent second-degree atrioventricular block Mobitz type 1      (Wenckebach).  The patient is asymptomatic.  2. Hypothyroidism.  3. Diabetes mellitus type 2.  4. Hypertension.  5. History of coronary disease with remote stenting of the right      coronary in 1999.   PLAN:  We will continue to observe on telemetry.  We will need to avoid  any AV nodal blocking agents, but she is currently on none of these.  There is no indication for a pacemaker at this time.  We will progress  activity as tolerated.           ______________________________  Peter M. Swaziland, M.D.     PMJ/MEDQ  D:  09/15/2007  T:  09/15/2007  Job:  [id number]   cc:   Marlowe Kays, M.D.  Fax: [phone]   Alfonse Alpers. Dagoberto Ligas, M.D.  Fax: [phone]

## 2010-09-24 NOTE — Cardiovascular Report (Signed)
[name], [name] NO.:  [id number]   MEDICAL RECORD NO.:  [id number]          PATIENT TYPE:  INP   LOCATION:  2505                         FACILITY:  [facility]   PHYSICIAN:  [name], M.D.  DATE OF BIRTH:  1916/11/16   DATE OF PROCEDURE:  06/30/2008  DATE OF DISCHARGE:                            CARDIAC CATHETERIZATION   INDICATIONS FOR PROCEDURE:  This is an 77 year old white female with  history of diabetes and hyperlipidemia and hypertension.  She has had  remote stenting of the mid right coronary.  She presented with non-Q-  wave myocardial infarction, AV block and presyncope.   PROCEDURES:  1. Left heart catheterization.  2. Coronary and left ventricular angiography.   EQUIPMENTS USED:  A 6-French 4-cm right and left Judkins catheter, 6-  French pigtail catheter, 6-French arterial sheath.   ACCESS:  Via the right femoral artery using the standard Seldinger  technique.   MEDICATIONS:  Local anesthesia, 1% Xylocaine, Versed 1 mg IV.   CONTRAST:  A 110 mL of Omnipaque.   HEMODYNAMIC DATA:  1. Aortic pressure was 153/54 with mean of 89 mmHg.  2. Left ventricular pressure is 150 with EDP of 12 mmHg.  3. There is no aortic valve gradient by pullback.   ANGIOGRAPHIC DATA:  The left coronary arises and distributes normally.  The left main coronary has 30% tapering stenosis at the ostium.  There  is also tapering 30% stenosis distally.   The left anterior descending artery is moderately calcified.  It has  diffuse 70% narrowing from the proximal to mid vessel spanning the first  diagonal branch.  The first diagonal branch is a bifurcating vessel.  It  has 70-80% ostial stenosis.  The smaller superior branch has a 90%  stenosis.  The more inferior branch which is moderate in size has 70%  stenosis.   The left circumflex coronary artery has a 50-60% stenosis in the  proximal vessel.   The right coronary is a dominant vessel.  It is diffusely  calcified.  In  the midvessel, there is a 95% stenosis with thrombus.  This is located  just prior to the previous stent.  The stent is widely patent.  In the  distal vessel, there is 40% narrowing before the posterior descending  artery.  The posterior descending artery has 80-90% proximal stenosis.   Left ventricular angiography was performed in the RAO view demonstrates  normal left ventricular size and function with ejection fraction of 60%.  No regional wall motion abnormalities were seen.  There is no  significant mitral insufficiency.  It is noted that the aorta is  diffusely calcified.   FINAL INTERPRETATION:  1. A 3-vessel obstructive coronary artery disease.  Culprit lesion is      in the mid right coronary.  2. Normal left ventricular function.   PLAN:  We will need to discuss possible treatment options with the  patient for revascularization versus medical therapy.           ______________________________  [name], M.D.     PMJ/MEDQ  D:  06/30/2008  T:  07/01/2008  Job:  [id number]   cc:   [name]. [name], M.D.

## 2010-09-24 NOTE — Discharge Summary (Signed)
[name], [name]                 ACCOUNT NO.:  [id number]   MEDICAL RECORD NO.:  [id number]          PATIENT TYPE:  INP   LOCATION:  3742                         FACILITY:  [facility]   PHYSICIAN:  Peter M. Swaziland, M.D.  DATE OF BIRTH:  Jan 17, 1917   DATE OF ADMISSION:  06/28/2008  DATE OF DISCHARGE:  07/07/2008                               DISCHARGE SUMMARY   HISTORY OF PRESENT ILLNESS:  Ms. Starliper is an 77 year old white female  who presented to the emergency room on day of admission complaining of  right-sided chest pain, feeling of weakness and presyncope.  She was in  church when these symptoms occurred.  When brought to emergency room,  rhythm strip showed evidence of second-degree AV block with Wenckebach  type block.  She had previously been asymptomatic with first-degree AV  block.  The patient is status post stenting of the right coronary artery  in 1999.  She has had no recurrent angina since then.  She had been  evaluated recently for possible ventral hernia surgery.   For details of her past medical history, social history, family history  and physical exam please see admission history and physical.   LABORATORY DATA:  Chest x-ray: Showed heart upper normal.  She had some  chronic lung changes but no active disease.  She did have a CT of her  chest with contrast in the emergency room.  This showed no evidence of  pulmonary emboli.  She had some mild septal thickening, consistent with  mild interstitial edema.  She had a small hiatal hernia with a patulous  gastroesophageal junction.  There was also evidence of gallstones and  osteopenia.  Additional laboratory data:  Hemoglobin was 11.9,  hematocrit 35.  BUN 29, creatinine 0.9, glucose 192.  Initial point of  care cardiac markers were negative x2.  CBC showed a white count of  8600, hemoglobin 11.6, hematocrit 33.6, platelets 244,000.  Urinalysis  was negative.  Subsequent urine culture during her hospital stay, first  culture showed 50,000 colonies per mL of Strep viridans species.  This  was felt to be a contaminant, so her urine culture was repeated.  This  did show greater than 10 to the fifth colonies of multiple bacterial  species, also suggesting contaminant.  Subsequent D-dimer was 0.67.  Subsequent CPK was 80 with 2.2 MB.  Troponin was elevated at 0.08 and  then 0.21 before declining to 0.05.  CPK-MBs were all negative.  Hemoglobin A1c was 6.5%.  T4 was 9.9.  TSH was 2.556.  BNP level was  345.  Total cholesterol is 160 with HDL 30, LDL 94, triglycerides 160.  Anemia panel showed normal iron stores with iron 74, TIBC of 346,  percent saturation was 21%.  B12 and folate levels were normal.  Ferritin was 19.   HOSPITAL COURSE:  The patient was admitted to telemetry monitor.  She  was placed on amlodipine for further blood pressure lowering.  She had  an echocardiogram performed which demonstrated overall normal systolic  function with ejection fraction of 55-60%.  There were no regional or  global left ventricular wall motion abnormalities.  She had mild LVH.  There was evidence of mild aortic stenosis with a mean gradient of 9  mmHg, a valve area of 1.6 cm2.  She had moderate mitral annular  calcification, moderate left atrial enlargement.  She continued to have  episodes of Mobitz type 1 second-degree AV block with profound  bradycardia with heart rates down in the upper 30s and low 40s.  She  denied any dizziness or presyncope with these symptoms.  Given her  positive troponin levels, we recommended proceeding with cardiac  catheterization.  This was performed on June 30, 2008.  This  demonstrated 30% disease in the ostial and distal left main.  The LAD  had diffuse disease from the proximal to mid vessel up to 70%, involving  the first diagonal vessel with 78% ostial diagonal disease.  There was a  small superior branch of the diagonal that was up to 90% stenosed.  The  left circumflex  coronary had 56% disease in the proximal vessel.  The  right coronary had a 95% midvessel stenosis with filling defect just  prior to her previous stent.  The PDA had a 90% stenosis proximally.  Left ventricular function was normal.  She was noted have diffuse aortic  calcification.   We subsequently had a discussion about treatment of her coronary disease  with the patient considering possible bypass surgery versus partial  revascularization with stenting and medical therapy.  It was felt that,  given her advanced age and diabetes, her risk of bypass surgery would be  fairly substantial.  We therefore decided to treat her disease with  stenting of the culprit lesion in her mid right coronary and treating  the rest of her disease medically.  The patient was subsequently loaded  on aspirin and Plavix.  On July 03, 2008, she did undergo stenting  of the mid right coronary artery, using a 3.0 x 12 mm multilength Vision  stent.  This was a bare metal stent.  It was postdilated up to 3.5 mm.  While this was a technically difficult procedure, an excellent  angiographic result was obtained.   During her procedure and subsequent throughout her hospitalization, she  continued to have significant bradycardia with her second degree AV  block.  We recommended pacemaker implant to optimize her medical therapy  for her angina.  The patient was seen initially by Dr. Lady Deutscher.  On  July 05, 2008 he underwent attempted pacemaker insertion in the left  subclavian.  However, he was unable to obtain adequate venous access  during this.  We subsequently consulted Dr. Hillis Range of EP service.  The patient was hydrated and she subsequently returned and underwent a  placement of dual-chamber pacemaker on July 06, 2008.  She had a  Medtronic Versa VEDRO1 model implanted, serial number [id number].  Her  lower rate limit was set at 60.  Follow-up chest x-ray showed leads in  good position with  no significant pneumothorax or edema.  A pressure  dressing was placed on the pacer site overnight and she had no  significant hematoma, despite being on aspirin and Plavix.  Her  subsequent pacemaker check the following morning was normal.  The  patient was started on beta-blocker therapy after pacemaker was  implanted and was doing very well.   Since she had no family locally and lives alone, she elected to be  transferred to a nursing facility until she is able to function  independently.  Therefore, arrangements were made for to be discharged  to [facility] for the next couple of weeks.  The  patient was discharged on July 07, 2008, in stable condition.   DISCHARGE DIAGNOSES:  1. Non-Q-wave myocardial infarction.  2. Coronary artery disease.  The patient has moderate three-vessel      disease and had successful intracoronary stenting of the mid right      coronary artery, which was the culprit lesion.  3. Second-degree AV block, Mobitz type 1, with symptomatic bradycardia      and presyncope.  4. Diabetes mellitus, type 2.  5. Dyslipidemia.  6. History of arthritis.  7. History of colon cancer, status post surgery in October 2007.  8. Ventral hernia.   DISCHARGE MEDICATIONS:  1. Coated aspirin 325 mg daily.  2. Plavix 75 mg daily.  3. Micardis 40 mg per day.  4. Simvastatin 20 mg daily.  5. Actos 15 mg daily.  6. Lasix 60 mg daily.  7. Synthroid 88 mcg per day.  8. Fish oil capsules two a day.  9. Nitroglycerin 0.4 mg sublingual p.r.n.  10.Metoprolol 25 mg one half tablet twice a day.   The patient is instructed to keep her pacemaker incision dry for the  next 7 days and then sponge bath until March 4.  She has follow-up with  Dr. Johney Frame at the [facility] on Monday, March 8, at 2 p.m.  She will see Dr. Johney Frame back for a one-time visit on Friday, May 28 at  9:45 a.m.  She will follow up Dr. Swaziland in 2 weeks.  She is to continue  on  low-sodium, carbohydrate modified diet.  She will need to maintain on  aspirin and Plavix and would recommend postponing any elective surgery,  such as her hernia repair, for at least 6 months.  Her discharge status  is improved.           ______________________________  Peter M. Swaziland, M.D.     PMJ/MEDQ  D:  07/07/2008  T:  07/07/2008  Job:  [id number]   cc:   Alfonse Alpers. Dagoberto Ligas, M.D.  Lorne Skeens. Hoxworth, M.D.  Hillis Range, MD

## 2010-09-24 NOTE — Op Note (Signed)
NAMEJAILIN, [name]                 ACCOUNT NO.:  [id number]   MEDICAL RECORD NO.:  [id number]          PATIENT TYPE:  INP   LOCATION:  3742                         FACILITY:  [facility]   PHYSICIAN:  Elmore Guise., M.D.DATE OF BIRTH:  July 07, 1916   DATE OF PROCEDURE:  07/05/2008  DATE OF DISCHARGE:                               OPERATIVE REPORT   INDICATIONS FOR PROCEDURE:  Bradycardia with second-degree  atrioventricular block.   HISTORY OF PRESENT ILLNESS:  [name] is a very pleasant 77 year old  white female with history of coronary artery disease who continues to  have off and on second-degree heart block.  She was referred for  permanent pacemaker implant.  She does need AV nodal agents because of  her coronary disease and at this time cannot tolerate it because of her  AV block.   DESCRIPTION OF PROCEDURE:  The patient was brought to the cardiac cath  lab.  After appropriate informed consent, she was prepped and draped in  a sterile fashion.  Approximately 20 mL of 1% lidocaine was used for  local anesthesia.  A 2-inch incision was made in the left deltopectoral  groove.  A subcutaneous pocket was then made.  Hemostasis was obtained.  A venogram was then performed.  Multiple unsuccessful attempts were made  in access on the left axillary vein.  Venogram showed good flow down the  axillary/subclavian vein.  Repeat attempts were made and were  unsuccessful.  A repeat venogram was performed showing some contrast  hang-up distally with collateral flow noted.  The site was then  irrigated with kanamycin solution and the procedure was abandoned.  The  wound was closed in 3 layers.  No hematoma was noted.  Steri-Strips were  applied to the wound.  The patient remained in stable and satisfactory  condition.  She was emotional throughout the procedure requiring Valium  and fentanyl because of her anxiety.  Blood pressures were 140-160/70-  90, heart rates in the 70s-80s, and O2  saturations of 98%.  She was  transferred from the cardiac cath lab in stable condition.   IMPRESSION:  1. Unsuccessful attempt at dual-chamber permanent pacemaker implant      from the left side.  2. Likely dehydration.  (A wedge was placed under her lower      extremities and we were still unsuccessful, despite better      preload).  3. Likely vessel spasm.   PLAN:  I discussed unsuccessful attempts with Dr. Swaziland.  I also  reviewed her venogram films with Dr. Johney Frame.  I do think she needs a  repeat procedure likely for tomorrow, however, I would recommend better  anesthesia with possible propofol or other stronger anesthetic agents to  allow her to rest and sleep during the procedure.  She will go back to  her room tonight to continue IV hydration with IV placement on her right  arm to allow a venogram to be performed.  The patient was updated  regarding plans and family was updated regarding the above procedure and  plans for tomorrow.  Dr. Johney Frame  will attempt pacemaker implant tomorrow  from the right side.      Elmore Guise., M.D.  Electronically Signed    TWK/MEDQ  D:  07/05/2008  T:  07/06/2008  Job:  [id number]

## 2010-09-24 NOTE — H&P (Signed)
NAMEDENETTA, FEI                 ACCOUNT NO.:  [id number]   MEDICAL RECORD NO.:  [id number]          PATIENT TYPE:  OBV   LOCATION:  2505                         FACILITY:  [facility]   PHYSICIAN:  Cassell Clement, M.D. DATE OF BIRTH:  Dec 06, 1916   DATE OF ADMISSION:  06/28/2008  DATE OF DISCHARGE:                              HISTORY & PHYSICAL   CHIEF COMPLAINT:  Presyncope and right-sided chest pain.   HISTORY:  This is an 77 year old Caucasian female admitted through the  emergency room with right-sided chest pain and a feeling of weakness and  dizziness and presyncope.  She was at church this morning when the  symptoms occurred.  She had participated in a group exercise class of  chair exercises which are not strenuous and felt okay.  After that,  however, she noted some mild right-sided chest discomfort and some  weakness.  One of the other church members asked her to help with  cleaning up the sanctuary before she went home and so she did try to do  some work but felt progressively worse and felt presyncopal.  The pastor  called 911.  She was brought to the emergency room where her  electrocardiogram showed no acute changes, but her rhythm strips showed  first-degree heart block with periods of second-degree Wenckebach heart  block.  She does have a past history of first-degree block in the past,  but no history of dizzy spells or symptomatic bradycardia.  She was  recently seen by Dr. Peter Swaziland in the office on June 02, 2008, for  preop clearance of anticipated ventral hernia repair which is scheduled  for July 26, 2008.  The patient does have a history of known coronary  artery disease and in 1999 underwent PTCA and stent of the mid right  coronary artery.  Since then, she has had no recurrence of her previous  exertional angina.  Because of her age, it was felt that the patient did  not need a nuclear stress testing prior to the anticipated ventral  hernia surgery.   The patient has a past history of hypertension,  diabetes, hypothyroidism, and hyperlipidemia as well as a past history  of known coronary artery disease.   HOME MEDICATIONS:  1. Actos 15 mg daily.  2. Zocor 20 mg 3 times a week.  3. Aspirin 81 mg 3 times a week.  4. Furosemide 60 mg daily.  5. Fish oil capsules daily.  6. Cod liver oil daily.  7. Micardis 40 mg daily.  8. Synthroid 88 mcg daily.   SOCIAL HISTORY:  She is a widow.  She lives by herself.  She did have 2  children who both died at young age.  Both of the children were invalids  from birth because of birth injuries but lived to the ages of 48 and 40  respectively.  The patient does not use alcohol or tobacco.   PAST MEDICAL HISTORY:  She has had multiple orthopedic procedures by Dr.  Simonne Come.  She had colon cancer surgery by Dr. Johna Sheriff in October 2007  and she did not  require subsequent chemotherapy or radiotherapy.   FAMILY HISTORY:  Her father died of a heart attack at age 65.  Her  mother was killed in an accident, sometime in her mid 49s.   REVIEW OF SYSTEMS:  GASTROINTESTINAL:  No history of hematochezia.  CONSTITUTIONAL:  No fever or chills or night sweats.  All other systems  negative in detail.   PHYSICAL EXAMINATION:  VITAL SIGNS:  Blood pressure is 194/70, pulse is  60, normal sinus rhythm with PVCs.  HEAD AND NECK:  The pupils were equal and reactive.  There is no  nystagmus.  The carotids have normal upstroke.  No bruits.  Jugular  venous pressure is normal.  LUNGS:  Clear to auscultation.  HEART:  Soft systolic ejection murmur at the base.  There is no S3 or S4  gallop.  There is no rub.  ABDOMEN:  Soft and nontender.  There is a ventral hernia which is  nontender.  There are active bowel sounds.  EXTREMITIES:  Trace edema and 1+ pedal pulses.  There is no cyanosis or  clubbing.   Her chest x-ray shows borderline cardiomegaly, but otherwise  unremarkable.  EKG shows no ischemic ST changes.   She has normal sinus  rhythm with a first-degree heart block and intermittent second-degree AV  block.  The hemoglobin is down to 11.6.  The point-of-care cardiac  enzymes are negative.  Her blood sugar is elevated at 192, potassium  3.9, sodium 138, BUN 29, and creatinine 0.9.  Her white count is normal  at 8600, hemoglobin 11.6, and platelets 244,000.  Urinalysis is benign.   IMPRESSION:  1. Presyncope.  2. Atypical right-sided chest pain with known coronary artery disease      and a remote percutaneous transluminal coronary angioplasty with      stent to the right coronary artery in 1999.  3. New secondary heart block.  4. Ventral hernia, scheduled to be repaired on July 26, 2008.  5. Hypothyroidism, on Synthroid.  6. Essential hypertension with systolic hypertension in the emergency      room up to 194 systolic.  7. Hyperlipidemia.  8. Diabetes mellitus.  9. Lives alone.   DISPOSITION:  We are admitting to telemetry to Dr. Peter Swaziland on  observation status.  We will evaluate her anemia.  We will get cardiac  enzymes.  We will observe for further arrhythmias on telemetry.  I will  add amlodipine for additional blood pressure lowering.  Further measures  as per Dr. Peter Swaziland.           ______________________________  Cassell Clement, M.D.     TB/MEDQ  D:  06/28/2008  T:  06/29/2008  Job:  [id number]   cc:   Peter M. Swaziland, M.D.  Alfonse Alpers. Dagoberto Ligas, M.D.  Lorne Skeens. Hoxworth, M.D.

## 2010-09-24 NOTE — Op Note (Signed)
[name], [name]                 ACCOUNT NO.:  [id number]   MEDICAL RECORD NO.:  [id number]          PATIENT TYPE:  AMB   LOCATION:  DAY                          FACILITY:  [facility]   PHYSICIAN:  Marlowe Kays, M.D.  DATE OF BIRTH:  [birth year]   DATE OF PROCEDURE:  05/03/2007  DATE OF DISCHARGE:                               OPERATIVE REPORT   PREOPERATIVE DIAGNOSES:  1. Torn rotator cuff.  2. Degenerative arthritis, acromioclavicular joint.   POSTOPERATIVE DIAGNOSES:  1. Torn rotator cuff.  2. Degenerative arthritis, acromioclavicular joint.   OPERATION PERFORMED:  Anterior acromionectomy with repair of torn  rotator cuff and open distal clavicle resection right shoulder.   SURGEON:  Marlowe Kays, M.D.   ASSISTANTDruscilla Brownie. [name].   ANESTHESIA:  General.   PATHOLOGY AND JUSTIFICATION FOR PROCEDURE:  Because of painful right  shoulder she had an MRI performed.  This demonstrated the above  pathology with a large rotator cuff tear with retraction.  Despite her  age of 85, she felt that her quality of life was so degraded and the  pain enough of a problem that she would like to go ahead and have this  repaired.  We had surgical clearance from her medical physician ahead of  time.   DESCRIPTION OF PROCEDURE:  Prophylactic antibiotics.  Satisfactory  general anesthesia preceded by an interscalene block.  Allen frame.  Time out performed.  Right shoulder girdle was prepped with DuraPrep,  draped with sterile field.  Ioban employed.  Incision was marked out  along the distal clavicle  curving downward over the proximal rotator  cuff.  With the cutting cautery I incised the fascia __________  over  the distal clavicle identifying the [facility] joint and carrying it distally  past the tip of the acromion splitting the deltoid for a centimeter or  two.  The subperiosteal dissection, I placed two Homan retractors  beneath the distal clavicle and after measuring off roughly a  centimeter  and a half of the distal clavicle, amputated the clavicle at this point.  I removed the cut portion of the distal clavicle with towel clip and  cutting cautery technique.  Bone wax was placed over the distal  clavicle.  I then undermined a very prominent subacromial bone and  protecting it  with the Cobb elevator did my initial anterior  acromionectomy with micro saw.  She had fairly significant impingement  problem and I removed additional bone with micro saw and Baer rongeur.  When I had adequate decompression, we were able to see the rotator cuff  tear which was basically complete from front to back with retraction but  with the arm slightly abducted I was able to bring the rotator cuff back  to its original position.  I then with small rongeur I debrided down the  area of the greater tuberosity and ended up using a total of three four  pronged super Mitek anchors to stabilize the rotator cuff.  Her bone was  very osteoporotic and all of these were individually tested for  stability.  Supplemented these  with individual suture of the #1 Ethibond  attached to the leads on the anchors.  This seemed to give a good stable  repair and I tested with her arm to her side.  I then irrigated the  wound well, then placed bone wax over the distal clavicle and Gelfoam in  the resection interval.  The wound was then closed in layers with  interrupted #1 Vicryl in the deltoid  muscle and fascia over the anterior acromion, 2-0 Vicryl superficially,  Steri-Strips on the skin.  Dry sterile dressing and shoulder immobilizer  were applied with a little bolster beneath the arm to take some stress  off the repair.  The patient tolerated the procedure well was taken to  recovery in satisfactory condition with no known complications.           ______________________________  Marlowe Kays, M.D.     JA/MEDQ  D:  05/03/2007  T:  05/04/2007  Job:  [id number]

## 2010-09-24 NOTE — Op Note (Signed)
NAME:  [name], [name]                 ACCOUNT NO.:  [id number]   MEDICAL RECORD NO.:  [id number]          PATIENT TYPE:  AMB   LOCATION:  DAY                          [facility]ACILITY:  [facility]   PHYSICIAN:  Marlowe Kays, M.D.  DATE OF BIRTH:  1916/06/05   DATE OF PROCEDURE:  09/14/2007  DATE OF DISCHARGE:                               OPERATIVE REPORT   PREOPERATIVE DIAGNOSIS:  Recurrent rotator cuff tear, right shoulder.   POSTOPERATIVE DIAGNOSIS:  Recurrent rotator cuff tear, right shoulder.   OPERATION:  Anterior acromionectomy and repair of recurrent rotator cuff  tear utilizing tissue mend graft, right shoulder.   SURGEON:  Marlowe Kays, M.D.   ASSISTANTDruscilla Brownie. Idolina Primer, P.A.C.   ANESTHESIA:  General.   INDICATIONS FOR PROCEDURE:  She had an extensor rotator cuff tear which  I repaired on May 03, 2007.  She did well until about 3 months  after surgery, when she started developing recurrent pain in the right  shoulder.  She is not sure if this was due to being active with physical  therapy.  Because of the persistent pain, I went ahead and had an  arthrogram performed on August 13, 2007, which did show an extensive full-  thickness rotator cuff tear.  This appeared to emanate from a single  source which was back underneath the acromion and was actually quite  small, with the previous repair which required suture anchors, extensive  reattachment, did seemed to be intact.  She is here today for the above-  mentioned surgery.  See operative description for additional details.   DESCRIPTION OF PROCEDURE:  Prophylactic antibiotics, satisfactory  general anesthesia, beach-chair position on the sliding frame, right  shoulder girdle was prepped with DuraPrep, draped in a sterile field.  IV employed.  Time-out performed.  I went to the previous surgical  incision and carefully split the fascia over the and residual anterior  acromion and placed a small Therapist, nutritional, followed  by a very small  Cobb elevator beneath the anterior acromion to try and minimize any  damage to the underlying rotator cuff.  Posteriorly she had what  appeared to be residual bony impingement and this was the main focus of  my anterior acromionectomy, although I did remove additional bone at the  prior site which gave [name] better exposure.  On immediate evaluation of  the rotator cuff, there was no definite rotator cuff tear seen.  Medial  to the insertion, it did seem to be quite thin and I started out by  oversewing the rotator cuff from front to back with multiple 2-0  synthetic sutures.  During this process she did develop a definite area  of leakage due to the needle in the anterior rotator cuff near where  there were adhesions present.  This rotator cuff was quite thin there.  Accordingly, I repaired this defect and then elected to use a tissue  mend graft which I placed down over the entire rotator cuff and sutured  it down around the perimeter with interrupted 2-0 Vicryl.  I then used  methylene blue into  the joint and the leakage was at the posterior  portion of where we had attached the graft.  Accordingly, I took some  more tissue mend and placed it over this posterior area of leakage.  This was not amenable to suture, so in order to try and stabilize the  graft I put Surgicel on top and allowed it to adhere to the surrounding  tissue and this seemed to stabilize the graft nicely.  There was  irrigated with sterile saline.  Soft tissue was infiltrated with 0.5%  Marcaine with adrenaline.  Wound closed in interrupted #1  Vicryl over the fascia of the anterior acromion, 2-0 Vicryl  superficially, and Steri-Strips on the skin.  Dry sterile dressing and  shoulder immobilizer were applied.  She tolerated the procedure well was  taken to the recovery in satisfactory condition with no known  complications.           ______________________________  Marlowe Kays, M.D.      JA/MEDQ  D:  09/14/2007  T:  09/14/2007  Job:  [id number]

## 2010-09-24 NOTE — Consult Note (Signed)
[name], [name]                 ACCOUNT NO.:  [id number]   MEDICAL RECORD NO.:  [id number]          PATIENT TYPE:  INP   LOCATION:  3742                         FACILITY:  [facility]   PHYSICIAN:  Hillis Range, MD       DATE OF BIRTH:  [birth year]   DATE OF CONSULTATION:  DATE OF DISCHARGE:                                 CONSULTATION   CONSULTING PHYSICIAN:  Rosine Abe, MD   REASON FOR CONSULTATION:  Second-degree AV block.   HISTORY OF PRESENT ILLNESS:  [name] is a pleasant 77 year old female  with a history of coronary artery disease, hypertension, diabetes,  hyperlipidemia, hypothyroidism, and multiple prior bilateral shoulder  surgeries who was admitted on June 28, 2008, with chest pain and  presyncope.  The patient notes that she had been at church helping out  with cleaning of the sanctuary when she developed acute presyncope.  She  notes significant lightheadedness with associated weakness followed by  right-sided chest discomfort.  Upon arrival to the emergency department,  she was noted to have second-degree AV block.  She was therefore  admitted for further evaluation.  Serial cardiac markers were performed,  which revealed a mild elevation in her troponin.  She therefore  underwent left heart catheterization by Dr. Peter Swaziland, which revealed  three-vessel obstructive coronary artery disease.  The mid right  coronary artery had a 95% stenosis with thrombus, which was felt to be a  culprit lesion.  She underwent successful stenting of this lesion on  July 03, 2008, but continued to have frequent episodes of second-  degree AV block with 2-1 AV conductions and prolonged RR intervals.  The  patient reports occasional dizziness and fatigue.  She denies any  further chest pain or shortness of breath.  She is evaluated by Dr.  Reyes Ivan and went today for a scheduled pacemaker to be implanted.  During  the initial venous access, she had vasospasm of the left  axillary vein  as documented by venogram and the procedure could therefore not be  performed.  The incision was closed and the patient was returned to her  room without early apparent complications.  Electrophysiology is now  consulted for assistance with pacemaker implantation.  Presently, the  patient is resting comfortably without complaint.   PAST MEDICAL HISTORY:  1. Coronary artery disease.  2. Hypertension.  3. Hyperlipidemia.  4. Hypothyroidism.  5. Diabetes.  6. Recurrent tear of the rotator cuff of the right shoulder, status      post surgeries x2, status post left shoulder surgery x1.  7. History of colon cancer, status post surgical resection.  8. Symptomatic second-degree AV block (as above).   ALLERGIES:  NORVASC, HYTRIN, and MOTRIN.   HOME MEDICATIONS:  1. Micardis 40 mg daily.  2. Lasix 40 mg daily.  3. Actos 15 mg daily.  4. Simvastatin 40 mg nightly Monday, Wednesday, and Friday.  5. Aspirin 81 mg daily.   SOCIAL HISTORY:  The patient lives in [place] alone.  She is widowed.  She has no living children, but has several nieces  and nephews who are  actively involved in her care.  She denies tobacco, alcohol, or drug  use.   FAMILY HISTORY:  Notable for coronary artery disease.   REVIEW OF SYSTEMS:  All systems are reviewed and negative except as  outlined in the HPI above.   PHYSICAL EXAMINATION:  VITAL SIGNS:  Blood pressure 146/57, heart rate  59, respirations 22, sats 97% on room air, and afebrile.  Telemetry  reveals predominantly sinus rhythm with first-degree AV block though  intermittent episodes of Mobitz I second-degree AV block with prolonged  RR intervals is noted.  GENERAL:  The patient is an elderly female in no acute distress.  She is  alert and oriented x3.  HEENT:  Normocephalic and atraumatic.  Sclerae clear.  Conjunctivae  pink.  Oropharynx clear.  NECK:  Supple.  No thyromegaly, JVD, or bruits.  LUNGS:  Clear to auscultation  bilaterally.  HEART:  Regular rate and rhythm, 3/6 systolic ejection murmur at the  left sternal border.  GI:  Soft, nontender, and nondistended.  Positive bowel sounds.  EXTREMITIES:  No clubbing or cyanosis, 1+ lower extremity edema is  noted.  NEUROLOGIC:  Strength and sensation are intact.  SKIN:  No ecchymosis or lacerations.  The patient has a bandage over the  left deltopectoral region, which appears to be without evidence of  active hematoma or bleeding.  MUSCULOSKELETAL:  The patient is status post bilateral shoulder surgery.  PSYCHIATRIC:  The patient is intermittently tearful.   EKG from July 04, 2008, reveals sinus rhythm at 50 beats per minute  with Mobitz I second-degree AV block and at times an effective RR  interval 20 beats per minute, a first-degree AV block is also noted  nonspecific ST/T-wave changes are observed.   LABORATORY DATA:  TSH 2.556.  White blood cell count 6.2, hematocrit 27,  platelets 176.  Creatinine 0.6.   IMPRESSION:  [name] is a pleasant 77 year old female with a history  of coronary artery disease who was admitted with symptomatic Mobitz I  second-degree atrioventricular block with prolonged RR intervals and  presyncope.  She is noted to have a non-ST elevation myocardial  infarction and has undergone stenting of the right coronary artery,  which was felt to be the culprit lesion.  She does, however, have three-  vessel coronary artery disease.  A transthoracic echocardiogram from  June 29, 2008, reveals an ejection fraction of 55-60%.  I believe  that the patient would benefit from a dual-chamber pacemaker implanted  due to symptomatic bradycardia with prolonged RR intervals with Mobitz I  second-degree atrioventricular block and presyncope.  She underwent an  attempted pacemaker implantation today, which was limited due to venous  spasm of the left axillary vein.  As she has had an incision placed over  the left deltopectoral  region as well as spasm of the left axillary  vein, I think that we should proceed with pacemaker implantation from  the right chest.  Risks, benefits, and alternatives to dual-chamber  pacemaker implantation were discussed in detail with the patient.  These  risks include but are not limited to infection, bleeding, vascular  damage, pneumothorax, pericardial effusion with perforation and  tamponade, lead  dislodgement, and renal failure.  The patient understands these risks  and wishes to proceed.  We will therefore plan for dual-chamber  pacemaker in the morning.  I will hold her beta-blocker, which I feel  essential for coronary artery disease, so her pacemaker has been  implanted due to concerns for progressive atrioventricular block.      Hillis Range, MD  Electronically Signed     JA/MEDQ  D:  07/05/2008  T:  07/06/2008  Job:  [id number]   cc:   Elmore Guise., M.D.

## 2010-09-24 NOTE — Cardiovascular Report (Signed)
[name], [name] NO.:  [id number]   MEDICAL RECORD NO.:  [name]          PATIENT TYPE:  INP   LOCATION:  2912                         FACILITY:  [facility]   PHYSICIAN:  Peter M. Swaziland, M.D.  DATE OF BIRTH:  10/28/1916   DATE OF PROCEDURE:  07/03/2008  DATE OF DISCHARGE:                            CARDIAC CATHETERIZATION   INDICATIONS FOR PROCEDURE:  An 77 year old white female who presented  with non-Q-wave myocardial infarction and presyncope.  She had had  remote stenting of the mid to distal right coronary artery 10 years ago.  Diagnostic coronary angiogram demonstrated three-vessel disease with  diffuse LAD diagonal disease, moderate to severe PDA disease and a  critical mid-right coronary stenosis of 95% just prior to the stented  segment.  After further review, we opted to stent the mid-right coronary  stenosis and treat the remainder of her disease medically.  She was  brought to the cardiac catheterization after having been loaded with  Plavix and aspirin.   ACCESS:  Via the right femoral artery using the standard Seldinger  technique.   EQUIPMENT USED:  6-French arterial sheath, 6-French 4 cm right Judkins  guide with side holes, a 0.014 high-torque floppy wire, a 0.014 high-  torque floppy extra support wire, a 1.5 x 12-mm Sprinter balloon, a 2.0  x 12-mm Voyager balloon, a 2.5 x 12-mm Voyager balloon, a 3.0 x 12-mm  Multi-Link Vision stent, a 3.5 x 12-mm Voyager [place] balloon, and a 3.5 x 8-  mm Voyager [place] balloon.   CONTRAST:  85 mL of Omnipaque.   HEMODYNAMIC DATA:  Blood pressure is 134/38.  The patient was noted to  have multiple episodes of 2:1 AV block with heart rates down to 30.  These were relatively transient and asymptomatic.  She did have a  moderate-to-severe back pain during the procedure and was given an  additional 5 mg of IV Versed and 0.75 mcg of IV fentanyl.   ADDITIONAL MEDICATIONS:  Angiomax 0.75 mg/kg bolus followed by  continuous infusion of 1.75 mg/kg/hour.  ACT was 328 seconds.   PROCEDURE NOTE:  After initial guide shots were obtained, we were able  to cross the lesion with moderate difficulty with a high-torque floppy  wire.  However, with this wire across we were unable to pass a balloon  across the lesion beginning with a 2.5 and then a 2.0 and finally a  1.5  mm balloon.  We then passed a 0.014 high-torque floppy extra support  wire across the lesion distally.  With the original wire still across,  we were able to buddy the 1.5 mm balloon across the lesion.  We  performed a balloon inflation up to 10 atmospheres which resulted  approximately 45 seconds with rupture of the balloon.  We then exchanged  out for a 2.0 x 12-mm Voyager RX balloon inflating this up to 12  atmospheres and once again at 35 seconds the balloon burst.  We removed  our buddy wire at this point and using the extra support wire, we were  able to place a 2.5-mm Voyager  balloon which we inflated up to 8  atmospheres before and it also burst.  We were able at this point to  stent the lesion using a 3.0 x 12-mm Multi-Link Vision stent.  This was  deployed at 12 atmospheres with the stent balloon.  There was still  significant wasting of the mid stent.  We postdilated the entire stent  using a 3.25 x 12-mm Voyager [place] balloon, able to inflate up to 16  atmospheres and then an additional inflation up to 18 atmospheres.  There was still mild narrowing at the mid stent site.  We next upgraded  to a 3.5 x 8-mm Voyager [place] balloon and performed balloon inflation up to  16 atmospheres.  This yielded an excellent angiographic result with 0%  residual stenosis and TIMI grade 3 flow.   FINAL INTERPRETATION:  Successful intracoronary stenting of the mid  right coronary artery.  Would recommend continued aspirin and Plavix for  6 months.           ______________________________  Peter M. [place]aziland, M.D.     PMJ/MEDQ  D:  07/03/2008  T:   07/04/2008  Job:  [id number]   cc:   Alfonse Alpers. Dagoberto Ligas, M.D.  Lorne Skeens. Hoxworth, M.D.

## 2010-09-24 NOTE — Op Note (Signed)
NAMEAQUILLA, [name]                 ACCOUNT NO.:  [id number]   MEDICAL RECORD NO.:  [id number]          PATIENT TYPE:  INP   LOCATION:  3742                         FACILITY:  [facility]   PHYSICIAN:  Hillis Range, MD       DATE OF BIRTH:  12/03/1916   DATE OF PROCEDURE:  DATE OF DISCHARGE:                               OPERATIVE REPORT   SURGEON:  Hillis Range, MD   PREPROCEDURE DIAGNOSES:  1. Symptomatic Mobitz I second-degree atrioventricular block.  2. Symptomatic bradycardia.   PROCEDURES:  1. Right upper extremity venography.  2. Dual-chamber pacemaker implantation.   DESCRIPTION OF PROCEDURE:  Informed written consent was obtained and the  patient was brought to the Electrophysiology lab in a fasting state.  Her right chest was prepped and draped in the usual sterile fashion by  the EP lab staff.  The skin overlying the right deltopectoral region was  infiltrated with lidocaine for local analgesia.  Intravenous sedation  was not given as the patient has had difficulty with agitation during  her recent left heart catheterization and her prior pacemaker attempt.  A 5-cm incision was made over the left deltopectoral region and a  subcutaneous right-sided pacemaker pocket was fashioned using a  combination of sharp and blunt dissection.  Electrocautery was used to  assure hemostasis.  A venogram of the right upper extremity was  performed which revealed a patent right axillary and subclavian veins.  A cephalic vein was not visualized.  The right axillary vein was  cannulated and through this vein, a Medtronic model [id number] (serial  number [id number]) right atrial lead and a Medtronic model [id number]  (serial number [id number]) right ventricular lead were advanced with  fluoroscopic visualization into the right atrial appendage and low right  ventricular septal positions respectively.  The leads were actively  fixed to the myocardial walls in these locations.  Initial lead  measurements revealed an atrial lead P-wave of 3.3 mV with an impedance  of 617 ohms and a threshold of 1 volt at 0.5 milliseconds.  The right  ventricular lead R-wave measured 26 mV with an impedance of 878 ohms and  a threshold of 0.5 volts at 0.5 milliseconds.  The leads were then  secured to the pectoralis fascia using #2 silk suture over the suture  sleeves.  The leads were then connected to a Medtronic Versa model  [id number] (serial number [id number]) dual-chamber pacemaker.  The pacemaker  was then returned to the right subcutaneous pacemaker pocket.  The  pocket was irrigated with copious gentamicin solution.  Electrocautery  was again used to assure hemostasis.  The pocket was then closed in two  layers with 2.0 Vicryl suture for the subcutaneous and subcuticular  layers.  Steri-Strips and a sterile dressing were then applied.  There  were no early apparent complications.   CONCLUSIONS:  1. Symptomatic second-degree atrioventricular block.  2. Successful dual-chamber pacemaker implantation as described above.  3. No early apparent complications.      Hillis Range, MD  Electronically Signed     JA/MEDQ  D:  07/06/2008  T:  07/07/2008  Job:  [id number]   cc:   Peter M. Swaziland, M.D.  Elmore Guise., M.D.

## 2010-09-24 NOTE — Assessment & Plan Note (Signed)
[facility]                         GASTROENTEROLOGY OFFICE NOTE   NAME:COOPERKimmi, Acocella                      MRN:          [id number]  DATE:01/08/2007                            DOB:          02/03/1917    HISTORY OF PRESENT ILLNESS:  Ms. Shands is an 77 year old white female  who was found to have colon carcinoma requiring right hemicolectomy in  October 2007.  This was done laparoscopically by Dr. Johna Sheriff.  She has  a long history of colon polyps going back some 20 years with multiple  polypectomies by Dr. Corinda Gubler.  It was recommended to the patient before  she had surgery that she have follow up colonoscopy in one years time.  She currently denies any GI problems whatsoever.   PAST MEDICAL HISTORY:  1. Hypertension.  2. Cardiovascular disease.  3. Diabetes mellitus.  4. Degenerative arthritis.   PRIMARY CARE PHYSICIAN:  Alfonse Alpers. Dagoberto Ligas, M.D.   CARDIOLOGIST:  Peter M. Swaziland, M.D.   REVIEW OF SYSTEMS:  Her recent surgery showed no evidence of lymph node  involvement, and her tumor was rated at T3N0 adenocarcinoma.   The patient denies any cardiovascular or pulmonary complaints at this  time.  She says her appetite is good, weight is stable, and she is  having 3-4 soft bowel movements a day without melena or hematochezia.   MEDICATIONS:  1. Lasix 40 mg daily.  2. Micardis 40 mg daily.  3. Actos 15 mg daily.  4. Synthroid 75 mcg daily for any thyroid dysfunction.  5. Simvastatin 40 mg currently once a week.  6. Aspirin 81 mg daily.  7. Fish oil and cod liver oil daily.   ALLERGIES:  She denies significant drug reactions except to NSAIDS.   PHYSICAL EXAMINATION:  GENERAL:  Elderly, but healthy appearing white  female in no distress.  Appears her stated age.  VITAL SIGNS:  She weighs 160 pounds, blood pressure 120/62, pulse 60 and  regular.  I could not appreciate stigmata of chronic liver disease.  CHEST:  Clear.  She appeared to be  in regular rhythm without murmurs,  gallops, rubs.  ABDOMEN:  I could not appreciate hepatosplenomegaly, abdominal masses or  tenderness.  Bowel sounds were normal.  Mental status was clear.   ASSESSMENT:  Mrs. Gary has had multiple colon polyps over the last 20  years and recently developed colon carcinoma, certainly suggesting  possible underlying genetic colorectal syndrome.   RECOMMENDATIONS:  We will proceed with colonoscopy and adjustment of the  patient's diabetic medications and will hold her aspirin for five days  before procedure.  At her age and her history, there is certainly a good  change that she has recurrent polyposis.  Should this exam be  unremarkable, we will take a look at her on a year by year basis and  decide whether or not colonoscopic evaluation is needed depending on her  clinical course and comorbid medical problems.     Vania Rea. Jarold Motto, MD, Caleen Essex, FAGA  Electronically Signed    DRP/MedQ  DD: 01/08/2007  DT: 01/09/2007  Job #: [id number]  cc:   Alfonse Alpers. Dagoberto Ligas, M.D.  Lorne Skeens. Hoxworth, M.D.  Peter M. Swaziland, M.D.

## 2010-09-24 NOTE — Discharge Summary (Signed)
NAMEEMMAJEAN, [name]                 ACCOUNT NO.:  [id number]   MEDICAL RECORD NO.:  [id number]          PATIENT TYPE:  INP   LOCATION:  1444                         FACILITY:  [facility]   PHYSICIAN:  Marlowe Kays, M.D.  DATE OF BIRTH:  1917/02/26   DATE OF ADMISSION:  09/14/2007  DATE OF DISCHARGE:  09/16/2007                               DISCHARGE SUMMARY   ADMITTING DIAGNOSES:  1. Recurrent tear of the rotator cuff of the right shoulder.  2. History of coronary disease, with remote stenting of right coronary      artery, 1999.  3. Hypertension.  4. Diabetes mellitus type 2.  5. Hyperlipidemia.  6. Hypothyroidism.  7. Previous surgical history, including sinus surgery, polypectomy,      appendectomy, and previous cataract surgery.   DISCHARGE DIAGNOSES:  1. Recurrent tear of the rotator cuff of the right shoulder.  2. History of coronary disease, with remote stenting of right coronary      artery, 1999.  3. Hypertension.  4. Diabetes mellitus type 2.  5. Hyperlipidemia.  6. Hypothyroidism.  7. Previous surgical history, including sinus surgery, polypectomy,      appendectomy, and previous cataract surgery.  8. Postoperative bradycardia, with intermittent second-degree      atrioventricular block, Mobitz type I, with bradycardia.   CONSULTS:  Peter M. Swaziland, M.D., cardiology.   OPERATION:  On Sep 14, 2007, the patient underwent anterior  acromionectomy, with repair of recurrent rotator cuff tear, with  TissueMend graft of the right shoulder.  [name], PA-C, assisted.   BRIEF HISTORY:  This 77 year old white female was seen by [name] for  continued progressive problems concerning pain in the right shoulder.  She underwent a repair of the rotator cuff with subacromial  decompression in the not too distant past, but during physical therapy  she began having pain and discomfort in the right shoulder.  We found  out by studies with an arthrogram that she had a leakage into  the  subacromial space from the joint itself, signifying that there was a  retear of the rotator cuff of.  After the risks and benefits of surgery  were described to the patient, we scheduled her for the above procedure.   COURSE IN THE HOSPITAL:  The patient tolerated the surgical procedure  quite well, had bradycardia postoperatively, both in the PACU and on the  floor.  Anesthesia recommended she be on telemetry.  She continued with  this bradycardia, with asystole pauses of 2 seconds.  I asked for and  received a consultation from Dr. Swaziland, who was her cardiologist for  this situation.  He saw the patient, diagnosed her with intermittent  second-degree AV block, Mobitz type I.  She was absolutely totally  asymptomatic with this situation.  Dr. Swaziland saw the patient today and  felt that the rhythm (Wenckebach) was improved and no sustained  bradycardia with seen.  I discussed this with him, and he said it was  okay for the patient to be transferred/discharged to skilled nursing,  preferably [facility].   The patient's desired is to go  to [facility], where she had her  rehabilitation last surgery.  She is independent.  She lives totally  alone, and with essentially immobilization of the right upper extremity  she would need to have care for own safety.   As far as her right shoulder was concerned, the wound remained clean and  dry.  Neurovascularly remained intact to the right upper extremity.  She  will have no physical therapy or occupational therapy to the right  shoulder.  She is very much aware of how to wear the sling and adjusting  the sling for her own comfort and protection.  As soon as a bed is  available at [facility], we will transfer her there for her rehabilitation.   LABORATORY VALUES IN THE HOSPITAL:  Hematologically showed a  preoperative hemoglobin of 12.3, hematocrit was 37.  Preoperative  chemistries were also within normal limits, with a glucose at 141.   Electrocardiogram showed sinus rhythm, with first-degree AV block (when  read by Dr. Swaziland, she had the second-degree block, Mobitz type).   CONDITION ON DISCHARGE:  Improved, stable.   PLAN:  The patient can be transferred to nursing home facility,  preferably [facility].   She is to have no movement of the right shoulder, either abduction,  extension, or external rotation.  Wear the sling for protection in  comfort.  She may move her elbow, wrist and hand to assist herself.   As of this dictation the patient did not have a postoperative bowel  movement.  We will work on this, and of course enema of choice and  laxative of choice will be indicated at the skilled nursing facility.   MEDICATIONS AT DISCHARGE:  1. Colace 100 mg b.i.d.  2. Senokot 1 tablet b.i.d.  3. Avapro 150 mg daily.  4. Zocor 40 mg daily.  5. Aspirin 81 mg daily.  6. Actos 50 mg daily.  7. Lasix 40 mg daily.  8. Synthroid 88 mcg daily.  9. Darvocet-N 100 or Vicodin 1-2 q.4-6 h. p.r.n. pain.  10.She will also have Micardis 40 mg q.a.m.Marland Kitchen   Dressing may be change to the right shoulder on as-needed basis, with  dry dressing, no ointments needed.  She will return to see Dr. Simonne Come  in his office 2 weeks after the date of surgery, and call for  appointment [phone].   We will reiterate that there be NO range of motion of the right shoulder  and no physical therapy.  If you have any questions concerning her  cardiac status, please call Dr. Peter Swaziland.  If you have any questions  concerning her general medical status, please call Dr. Corrin Parker.  If you have any questions concerning her orthopedic status, please call  Dr. Marlowe Kays, [phone].      Dooley L. [name].    ______________________________  Marlowe Kays, M.D.    DLU/MEDQ  D:  09/16/2007  T:  09/16/2007  Job:  [id number]

## 2010-09-24 NOTE — Discharge Summary (Signed)
[name], [name]                 ACCOUNT NO.:  [id number]   MEDICAL RECORD NO.:  [id number]          PATIENT TYPE:  INP   LOCATION:  1618                         FACILITY:  [facility]   PHYSICIAN:  Marlowe Kays, M.D.  DATE OF BIRTH:  10-21-1916   DATE OF ADMISSION:  05/03/2007  DATE OF DISCHARGE:                               DISCHARGE SUMMARY   DATE OF POSSIBLE DISCHARGE:  May 04, 2007   ADMITTING DIAGNOSES:  1. Tear rotator cuff of the right shoulder.  2. Hypertension.  3. Hypothyroidism.   DISCHARGE DIAGNOSES:  1. Tear rotator cuff of the right shoulder.  2. Hypertension.  3. Hypothyroidism.   OPERATION:  On May 03, 2007, the patient underwent anterior  acromionectomy and repair of rotator cuff of the right shoulder.   BRIEF HISTORY:  This 77 year old lady seen by [name] for continued  progressive problems concerning her shoulder.  She has had increasing  problems with moving the shoulder about, with using the arm above her  shoulder height.  Arthrogram showed that she had a tear rotator cuff as  well as impingement and after much discussion including the risks and  benefits of surgery decided to admit her for the above procedure.   COURSE IN THE HOSPITAL:  The patient tolerated the surgical procedure  quite well.  Neurovascular remained intact to the right upper extremity.  As previously known, this patient has no one at all to take care of her  at home.  She lives alone, maintains her own environment.  She, as well  as a niece, feels that she is unable to stay at home by herself during  her recuperation.  We therefore have asked discharge planning to assist  [name] in placement for rehabilitation.  This will be primarily until she is  more independent after several office visits.  She has no range of  motion of the right shoulder and we will not allow that postoperatively  so as not to tear the repair.  Once a facility is available and will  accept her due to her,  again, lack of care at home, we will transfer  her.  Laboratory values in the hospital hematologically showed a  hemoglobin 11.5, hematocrit 32.9.  Blood chemistries shows 178 on her  glucose.  Urinalysis negative for urinary tract infection.  Chest x-ray  from Dr. Jerelene Redden office showed COPD, osteopenia.  Electrocardiogram  also read by Dr. Dagoberto Ligas sinus rhythm.   CONDITION ON DISCHARGE:  Improved, stable.   PLAN:  The patient is discharged to a facility that can care for her  until she can maintain a safe, independent home life.  Any medical  problems need to be addressed to Dr. Dagoberto Ligas.   HOME MEDICATIONS:  1. Micardis 40 mg daily.  2. Synthroid 75 mcg daily.  3. Actos 50 mg daily.  4. Furosemide 40 mg daily.  5. Simvastatin 40 mg three times a week.  6. Aspirin 81 mg Monday, Wednesday, Friday.  7. Omega-3 fish oil 1000 mg daily.  8. Cod liver oil daily.   She is to return to see  [name] 2 weeks after the date of surgery.  Do dry  dressing to the right shoulder on an as-needed basis.  Again, there will  be no range of motion of the shoulder at all.  She may move her elbow in  the sling.  No internal or external rotation as well.  Any questions  orthopedically as far as shoulder is concerned are to be directed  towards Dr. Fayrene Fearing [place] whom she will see 2 weeks after the date of  surgery and he is at [phone].  Any other medical questions to Dr.  Corrin Parker.      Dooley L. [name].    ______________________________  Marlowe Kays, M.D.    DLU/MEDQ  D:  05/04/2007  T:  05/04/2007  Job:  [id number]   cc:   Alfonse Alpers. Dagoberto Ligas, M.D.  Fax: [phone]

## 2010-09-27 NOTE — Assessment & Plan Note (Signed)
[facility]                           GASTROENTEROLOGY OFFICE NOTE   NAME:[name], [name]                      MRN:          [id number]  DATE:11/28/2005                            DOB:          23-Apr-1917    This very nice patient comes in on November 28, 2005.  She is 77 years of age.  She is a patient of Dr. Dagoberto Ligas.  Says she has been having trouble with her  bowels.  Last week, she went to the bathroom on three separate days.  She  had abdominal pain in the center of her abdomen which was worse.  She did  not have to strain to go, but this caused the discomfort.  She had blood on  her toilet paper, as she has for quite some time.  Said she uses baby wipes,  but her hemorrhoids still have been bothering her, but this is intermittent.  She does note three to four bowel movements a day.  Has been anemic.  She  says Dr. Dagoberto Ligas continues to check her blood studies, and her MCV has been  elevated.  The reason she came today was because I had seen her in the past.  Colonoscopic examination in 2001 was essentially normal and only revealed  some mild to moderate diverticular disease.  In 1996, she had one small  polyp, which was destroyed.   PAST MEDICAL HISTORY:  1.  Hypertension.  2.  Hyperlipidemia.  3.  Diabetes.  4.  Appendectomy.   SOCIAL HISTORY:  Noncontributory.   REVIEW OF SYSTEMS:  Amazingly was noncontributory.  She has done amazingly  well for someone 77 years of age.   PHYSICAL EXAMINATION:  VITAL SIGNS:  She is 5 feet 3 inches, weight 166  pounds, blood pressure 142/58, pulse 78 and regular.  GENERAL:  Her physical examination was totally within normal limits.  HEENT:  Her oropharynx was negative.  NECK:  Negative.  CHEST:  Clear.  HEART:  Revealed a regular rhythm, without significant murmur.  ABDOMEN:  Soft.  No masses or organomegaly.  Nontender.  There were no  bruits or rubs.  EXTREMITIES:  Essentially unremarkable.   IMPRESSION:  1.  Recent change in bowel habits, with abdominal pain.  2.  Diabetes.  3.  Hypertension.  4.  Hyperlipidemia.  5.  Status post appendectomy.   RECOMMENDATIONS:  To get hemoccult studies on her.  I suggest that this was  all probably related to diverticular disease, and told her we could use some  stool softeners and laxatives if need be, and that I did not feel that a  repeat colonoscopic examination was in order unless there were noted to be  some positive hemoccult studies of significance.                                   Ulyess Mort, MD   SML/MedQ  DD:  12/01/2005  DT:  12/02/2005  Job #:  [id number]   cc:   Alfonse Alpers. Dagoberto Ligas, MD

## 2010-09-27 NOTE — Discharge Summary (Signed)
[name], LEVELS                 ACCOUNT NO.:  [id number]   MEDICAL RECORD NO.:  [id number]          PATIENT TYPE:  INP   LOCATION:  1618                         FACILITY:  [facility]   PHYSICIAN:  Marlowe Kays, M.D.  DATE OF BIRTH:  May 26, 1916   DATE OF ADMISSION:  05/03/2007  DATE OF DISCHARGE:  05/10/2007                               DISCHARGE SUMMARY   ADDENDUM:  Ms. Burkel had to stay extra in the hospital due to the fact  that there was really no care for her at home, and she needed to have  some sort of skilled facility to provide her safety.  The discharge  planning and clinical social workers worked diligently to find her a  place for her post-hospital stay.  Eventually they were able to secure a  bed at [facility] in [place].  After those arrangements were made,  the patient was able to be discharged to there.  There is no change in  the plan nor her medications nor the diagnoses from the discharge  summary that was dictated on May 04, 2007.      Dooley L. [name].    ______________________________  Marlowe Kays, M.D.    DLU/MEDQ  D:  05/25/2007  T:  05/25/2007  Job:  [id number]   cc:   Alfonse Alpers. Dagoberto Ligas, M.D.  Fax: [phone]

## 2010-09-27 NOTE — Op Note (Signed)
[name], [name]                 ACCOUNT NO.:  [id number]   MEDICAL RECORD NO.:  [id number]          PATIENT TYPE:  INP   LOCATION:  1430                         FACILITY:  [facility]   PHYSICIAN:  Sharlet Salina T. Hoxworth, M.D.DATE OF BIRTH:  [birth year]   DATE OF PROCEDURE:  02/10/2006  DATE OF DISCHARGE:                                 OPERATIVE REPORT   PREOPERATIVE DIAGNOSIS:  Adenocarcinoma of the right colon.   POSTOPERATIVE DIAGNOSIS:  Adenocarcinoma of the right colon.   SURGICAL PROCEDURES:  Laparoscopic-assisted right hemicolectomy.   SURGEON:  Lorne Skeens. Hoxworth, M.D.   ASSISTANT:  Lebron Conners, M.D.   ANESTHESIA:  General.   BRIEF HISTORY:  [name] is an 77 year old female who recently underwent  colonoscopy due to change in bowel habits.  This was performed by Dr.  Corinda Gubler with findings of a 5 cm sessile mass in the ascending colon grossly  consistent with carcinoma.  Biopsy confirmed colonic adenocarcinoma.  After  discussion in the office, we have elected to proceed with laparoscopic-  assisted right hemicolectomy.  The nature of the procedure, indications,  risks of bleeding, infection, anastomotic leak, possible need for open  procedure and cardiorespiratory complications were discussed and understood.  She is now brought to the operating room for this procedure.   DESCRIPTION OF OPERATION:  The patient was brought to the operating room and  placed in supine position on the operating table and general orotracheal  anesthesia was induced.  The patient had undergone mechanical antibiotic  bowel prep at home.  Preoperative IV antibiotics were administered.  Foley  catheter was placed.  The abdomen was widely sterilely prepped and draped.  Correct patient and procedure were verified.  A 1 cm incision was made  vertically just above the umbilicus and dissection carried down to the  midline fascia which was sharply incised for 1 cm. The peritoneum entered  under  direct vision.  Through mattress sutures of 0 Vicryl, the Hasson  trocar was placed and pneumoperitoneum established.  Under direct vision, 5  mm trocars were placed in the suprapubic area and in the epigastrium.  Peritoneoscopy  was generally unremarkable. The patient was status post  appendectomy but there were minimal adhesions.  The terminal ileum was free.  The right colon was then mobilized extensively, dividing lateral peritoneal  attachments and sweeping the mesentery of the right colon medially.  Dissection was carried up around the hepatic flexure and the hepatocolic  ligament taken down with Harmonic scalpel and hepatic flexure mobilized.  The lesser omentum was entered at the midtransverse colon in an avascular  area and then this was divided back over toward the hepatic flexure using  the Harmonic scalpel and the proximal transverse colon and hepatic flexure  mobilize further medial inferiorly.  The duodenum was identified and  protected.  The right colon was eventually completely immobilized over  toward the midline.  At this point the [facility] trocar was removed and the  supraumbilical incision was lengthened to 6 cm and the lap disk placed.  The  cecum and right colon were brought up  through the lap disk and the omentum,  entire transverse colon and right colon and terminal ileum were able to be  brought out on the abdominal wall without difficulty.  There was a small  palpable, firm mass in the mid ascending colon.  Points of proximal and  distal resection at the terminal ileum and proximal transverse colon were  chosen.  The mesentery of the involved segment was then sequentially divided  between clamps and tied with 2-0 silk ties.  The ileocolic and right colic  vessels were doubly clamped and ligated.  After complete division of the  mesentery a functional end-to-end anastomosis was created between the  terminal ileum and transverse colon with single firing of the GIA 75  mm  stapler.  Staple line was inspected and was intact without bleeding.  The  common enterotomy was closed with a single firing of TA-60 stapler and the  specimen removed.  The mesentery was closed with several interrupted silk  stitches.  A reinforcing stitch was placed at the apex of the staple line.  The viscera then returned to the peritoneal cavity.  The abdomen was  irrigated and suctioned.  Hemostasis appeared complete.  Trocars and lap  disk were removed.  The midline fascia was closed with running #1 PDS begun  at either end of the incision and tied centrally.  The subcu was irrigated  and the skin closed with staples.   Sponge and instrument counts correct.  Pressure dressings were applied and  the patient taken to recovery in satisfactory condition.      Lorne Skeens. Hoxworth, M.D.  Electronically Signed     BTH/MEDQ  D:  02/10/2006  T:  02/11/2006  Job:  [id number]

## 2010-09-27 NOTE — H&P (Signed)
[name], [name]                 ACCOUNT NO.:  [id number]   MEDICAL RECORD NO.:  [id number]          PATIENT TYPE:  INP   LOCATION:  1618                         FACILITY:  [facility]   PHYSICIAN:  Marlowe Kays, M.D.  DATE OF BIRTH:  1917-01-01   DATE OF ADMISSION:  05/03/2007  DATE OF DISCHARGE:  05/10/2007                              HISTORY & PHYSICAL   This is dictated from the hospital record.   CHIEF COMPLAINT:  Pain in my right shoulder.   PRESENT ILLNESS:  This 77 year old white female is seen by [name] for  continuing problems concerning pain into her right shoulder.  She has  had more and more discomfort with range of motion to the shoulder.  She  is particularly uncomfortable with abduction at 90 degrees.  Internal  and external rotation are markedly uncomfortable.  MRI was performed  which showed tear of the rotator cuff and degenerative arthritis of the  [facility] joint of the right shoulder.  She is very active lady who is quite  independent.  She has found this pain is interfering with her day-to-day  activities and after much discussion including the risks and benefits of  surgery decided to ahead with distal clavicle resection and repair of  the rotator cuff.   PAST MEDICAL HISTORY:  This lady has been in relatively good health  throughout her lifetime.  She has developed hypertension and  hypothyroidism.   ALLERGIES:  MOTRIN AND HYTRIN.   CURRENT MEDICATIONS:  1. __________  40 mg daily.  2. Synthroid 75 mcg daily.  3. Actos 15 mg daily.  4. Furosemide 40 mg daily.  5. Simvastatin 40 mg 3 times a week Monday, Wednesday and Friday.  6. Aspirin Monday, Wednesday, Friday.  7. Omega fish oil.  8. Cod liver oil.   SOCIAL HISTORY:  She is retired and maintains independent living with no  intake of alcohol or tobacco products.   FAMILY HISTORY:  Noncontributory.   REVIEW OF SYSTEMS:  CNS: No seizures, paralysis, numbness, double  vision.  RESPIRATORY: No productive  cough, no hemoptysis, no shortness  of breath.  CARDIOVASCULAR: No chest pain, no angina, no orthopnea.  GASTROINTESTINAL: No nausea, vomiting, or melanotic stools.  GENITOURINARY:  No discharge or dysuria.  MUSCULOSKELETAL:  Primarily in  present illness with her right shoulder.   PHYSICAL EXAMINATION:  GENERAL APPEARANCE:  Alert, cooperative, friendly  78 year old white female.  VITAL SIGNS:  Please see intake form.  HEENT: Normocephalic.  PERRLA.  EOM intact.  Oropharynx is clear.  CHEST:  Clear to auscultation.  No rhonchi or rales.  HEART:  Regular rate and rhythm.  No murmurs are heard.  ABDOMEN:  Soft, nontender.  Liver and spleen not felt.  GENITALIA/RECTAL/PELVIC/BREASTS:  Not done, not pertinent to present  illness.  EXTREMITIES:  Pain with range of motion of the right shoulder as  mentioned above, particularly on internal-external rotation.   ADMISSION DIAGNOSES:  1. Tear of the rotator cuff of the right shoulder with      acromioclavicular arthrosis.  2. Hypertension.  3. Hypothyroidism.   PLAN:  The patient will undergo repair of the rotator cuff of the right  shoulder.  She lives alone and may need skilled nursing after regular  hospitalization.      [name].    ______________________________  Marlowe Kays, M.D.    DLU/MEDQ  D:  05/31/2007  T:  05/31/2007  Job:  [id number]

## 2010-09-27 NOTE — Op Note (Signed)
[facility]  Patient:    [name], [name]                      MRN: [id number] Proc. Date: 02/20/00 Adm. Date:  [id number] Attending:  Marlowe Kays Page                           Operative Report  PREOPERATIVE DIAGNOSIS:  Torn rotator cuff of left shoulder.  POSTOPERATIVE DIAGNOSIS:  Torn rotator cuff of left shoulder.  OPERATION:  Anterior acromionectomy and repair of torn rotator cuff, left shoulder.  SURGEON:  Illene Labrador. Aplington, M.D.  ASSISTANT:  Della Goo, P.A.  ANESTHESIA:  General.  INDICATIONS FOR PROCEDURE:  She fell injuring her left shoulder on December 25, 1999.  Plain x-rays were normal, but an arthrogram demonstrated a full thickness rotator cuff tear on ________ films with the major portion of the dye being extravasated underneath the AC joint.  She had no tenderness of the [facility] joint on exam however.  These findings were confirmed at surgery as discussed below.  DESCRIPTION OF PROCEDURE:  Prophylactic antibiotics and satisfactory general anesthesia, beach chair position on the Schlein frame.  The left shoulder girdle was prepped with DuraPrep, and draped in the sterile field.  Ioban employed.  Vertical incision from _______ the [facility] joint down to the greater tuberosity.  The [facility] joint was identified with the [facility] needle.  I undermined the anterior acromion which was prominent with small Cobb elevator followed by a larger Cobb elevator to protect, identifying the rotator cuff, and marked out the initial area of anterior acromionectomy with the Bovie, performed the anterior acromionectomy, removed the anterior acromion, and passed coracoacromial ligament.  I then performed additional anterior subacromial decompression with a combination of saw and rongeur until the subacromial space was widely decompressed.  She turned out to have two separate tears. The first was about the size of a pencil in diameter at the greater  tuberosity with an abrasion just above it, and then skipping about a 1.5 cm tear up vertically beneath the acromion and AC joint.  I managed this first by placing a suture at the lateral portion of the tear with #1 Tycron, and then using this as a tag to pull the rotator cuff lateralward while Ms. Vernon elevated the remaining anterior acromion up vertically.  I was able to place multiple side-to-side sutures until the longitudinal tear had been pulled.  I then went distally and opened up the tear slightly anteriorly and posteriorly to be sure there was no retracted fibers.  I then created a bony trough with a 1/4 inch curved osteotome and rongeur, and placed two lateral drill holes through the greater tuberosity.  I then used the two drill holes to place three horizontal mattress sutures with the center one being through the rotator cuff and bone entirely, and the ones anteriorly and posteriorly passed through the adjacent hole and passed through the rotator cuff.  I then supplemented this with additional terminal sutures of #1 Vicryl.  The rotator cuff was doubly sutured and smoothed down.  At the conclusion of the case, she has a nice stable repair with no impingement.  The wound was then irrigated with sterile saline and then infiltrated with 0.5% Marcaine with adrenalin.  I reattached a portion of the deltoid and fascia through the three drill holes through the anterior acromion, and the remainder of the deltoid and fascial  complex was closed with interrupted #1 Vicryl as well.  This gave a nice secure closure.  The subcutaneous tissue was closed with 4-0 Vicryl and the skin with Steri-Strips. A dry sterile dressing and short immobilizer applied.  She tolerated the procedure well and was taken to the recovery room in satisfactory condition with no known complications. DD:  02/20/00 TD:  02/21/00 Job: [id number]

## 2010-09-27 NOTE — H&P (Signed)
[facility]  Patient:    [name], [name]                       MRN: [id number] Adm. Date:  02/20/00 Attending:  Fayrene Fearing P. Aplington, M.D. Dictator:   Druscilla Brownie. Shela Nevin, P.A. CC:         Alfonse Alpers. Dagoberto Ligas, M.D., [facility], [address]             [place]  [address]   History and Physical  DOB:  1917/04/12.  DATE OF H&P:  February 06, 2000  CHIEF COMPLAINT:  Pain in my left shoulder.  HISTORY OF PRESENT ILLNESS:  This 77 year old white female has been seen by Dr. Simonne Come for continuing problems.  She injured her left shoulder on 12/25/99 when she stubbed her toe on the sidewalk and fell on her face extending her left arm.  Her face and hands were treated in the emergency room, but she has followed up here for continuing pain in her left shoulder. Examination reveals pain with passive adduction, tenderness over the rotator cuff with palpation and the AC joint.  Arthrogram has confirmed a tear of the rotator cuff.  Due to the fact that this is a very independent widowed lady and enjoys yard work and is extremely active despite her age, it is felt that she should benefit from surgical intervention with repair of the rotator cuff and anterior acromionectomy.  The patient has been seen and cleared by Dr. Alfonse Alpers. Gegick preoperatively for this surgery.  PAST MEDICAL HISTORY:  Surgeries in the past include an appendectomy in 1947, a polypectomy on the nose in 1972.  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  She has had laser surgery on the left eye for acute glaucoma as well as the right eye in 1985.  She has had endoscopic polypectomies in 1996.  Heart stents were replaced in 1999.  She currently has the controlled glaucoma by laser surgery, has hypertension and a hiatal hernia.  Dr. Alfonse Alpers. Dagoberto Ligas is her  family physician.  ALLERGIES:  MOTRIN which causes nausea.  CURRENT MEDICATIONS:  Triamterene/HCT 37.5, one q.d., Norvasc 2.5 mg one q.d., Actos 15 mg 1 q.d., Zocor 20 mg one Monday, Wednesday and Friday and 81 mg of aspirin on Monday, Wednesday and Friday.  SOCIAL HISTORY:  The patient is widowed.  He neither smokes or drinks and is very independent.  FAMILY HISTORY:  Positive for heart problems, asthma and diabetes.  REVIEW OF SYSTEMS:  CNS:  No seizures, strokes or paralysis, numbness, or double vision.  Respiratory:  No productive cough.  No hemoptysis, no shortness of breath.  The patient does state that she does have some posterior sinus drainage mainly when she is trying to sleep.  No orthopnea.  GI:  No nausea, vomiting, melanotic stool.  GU:  No discharge or hematuria. Musculoskeletal:  Primarily in present illness.  PHYSICAL EXAMINATION:  An alert, cooperative and pleasant 77 year old white female who looks slightly younger than her stated age.  VITAL SIGNS:  Blood pressure 160/86, pulse 72, respirations 12.  HEENT:  Normocephalic.  Pupils are small, equal and reactive to light and to accommodation.  Oropharynx is clear.  CHEST:  Clear to auscultation.  No rhonchi, no rales, no rubs.  HEART:  Regular rate and rhythm.  No murmurs were heard.  ABDOMEN:  Soft and nontender.  Liver and spleen not felt.  GENITALIA, RECTAL, PELVIC, BREAST:  Not done, not pertinent to present illness.  EXTREMITIES:  Left shoulder as in present illness above.  ADMITTING DIAGNOSIS: 1. Tear of the rotator cuff, of the left shoulder with impingement. 2. Hypertension. 3. Glaucoma (treated). 4. Hiatal hernia.  PLAN:  The patient will be admitted for repair of the rotator cuff of the left shoulder and anterior acromionectomy.  Should she have any medical problems we will certainly ask Dr. Corrin Parker to follow along with [name] during this patients hospitalization. DD:  02/06/00 TD:   02/06/00 Job: [id number]

## 2010-09-27 NOTE — Discharge Summary (Signed)
[facility]  Patient:    [name], [name]                      MRN: [id number] Adm. Date:  [id number] Disc. Date: [id number] Attending:  Marlowe Kays Page Dictator:   Ralene Bathe, P.A.-C                           Discharge Summary  ADMITTING DIAGNOSES: 1. Torn rotator cuff, left shoulder. 2. Hypertension. 3. Glaucoma. 4. Hiatal hernia.  DISCHARGE DIAGNOSES: 1. Torn rotator cuff, left shoulder, status post repair of left rotator    cuff tear. 2. Hypertension. 3. Glaucoma. 4. Hiatal hernia. 5. Mild postoperative nausea.  OPERATIONS:  Rotator cuff repair, anterior ______ on the left.  Surgeon: Dr. Simonne Come.  Assistant:  Maud Deed, P.A.-C.  General anesthesia.  BRIEF HISTORY:  [name] is an 77 year old female, has a documented torn rotator cuff, was having significant pain and disability, therefore warranting surgical fixation.  She also had anterior spurring on the acromion.  Risks and benefits were discussed with the patient for surgery and wished to proceed.  HOSPITAL COURSE:  The patient was admitted for the above-mentioned procedure and tolerated this well.  All appropriate IV antibiotics and analgesics provided.  Postoperatively, the patient had extremely painful first postoperative night and significant nausea requiring an additional stay.  On the postoperative day #2, she did extremely well, was cleared on p.o. analgesics and had no further nausea.  She is afebrile.  Vital signs are stable.  Neurovascularly she was intact and her dressing was dry.  She was stable for discharge to home.  LABORATORY DATA:  Shows admission hemogram within normal limits.  Coagulation times normal.  Routine chemistries preoperatively showed a potassium of 3.0 on the date of surgery, a repeat showed potassium 3.3.  Urinalysis showed urobilinogen positive, otherwise normal.  X-ray showed questionable mild changes of COPD.  No active disease on  chest x-ray.  CONDITION ON DISCHARGE:  Stable and improved.  DISCHARGE MEDICATIONS AND PLANS:  The patient will be discharged to home.  No range of motion to the shoulder.  Followup two weeks postoperatively, call for time.  ADLs only.  Resume home meds.  Prescription was given for Percocet, #40, one or two q.4-6h. p.r.n. pain.  Robaxin 500 mg, #31, q.8h. p.r.n. spasm and Phenergan 25 mg tabs one q.8h. p.r.n. DD:  04/02/00 TD:  04/04/00 Job: [id number]

## 2010-09-27 NOTE — Discharge Summary (Signed)
[name], Bell                 ACCOUNT NO.:  [id number]   MEDICAL RECORD NO.:  [id number]          PATIENT TYPE:  INP   LOCATION:  1430                         FACILITY:  [facility]   PHYSICIAN:  Sharlet Salina T. Bell, M.D.DATE OF BIRTH:  09-13-1916   DATE OF ADMISSION:  02/10/2006  DATE OF DISCHARGE:  02/15/2006                               DISCHARGE SUMMARY   DISCHARGE DIAGNOSIS:  Carcinoma of the right colon.   OPERATIONS AND PROCEDURES:  Laparoscopic-assisted right hemicolectomy  February 10, 2006.   HISTORY OF PRESENT ILLNESS:  [name] is an 77 year old female  who recently experienced some intermittent crampy lower abdominal pain  related to bowel movements.  She also noted a small amount of bright red  blood on the toilet tissue which is a chronic problem for her secondary  to hemorrhoids.  She has had a colonoscopy by Dr. Corinda Gubler within the  last five years with just some small polyps found.  However, with these  symptoms, she was referred back to Dr. Corinda Gubler and stool Hemoccults were  obtained which were positive and he therefore recommended proceeding  with colonoscopy.  This revealed a 5 cm sessile mass in the ascending  colon grossly consistent with carcinoma.  With these findings, the  patient is admitted after mechanical antibiotic bowel prep at home for  elective laparoscopic-assisted right colectomy.   PAST MEDICAL HISTORY:  1. Surgery significant for appendectomy for perforated appendicitis in      1940s.  2. She had colonoscopic polypectomy.  3. Laser eye surgery.  4. Medically, she is followed for coronary artery disease status post      stent placement in 1999 by Dr. [place] and no further problems on      yearly checkup.  5. She is followed by Dr. Dagoberto Ligas for mild diabetes, mild hypertension,      hypothyroidism and elevated cholesterol.   MEDICATIONS:  1. Micardis 40 mg daily.  2. Lasix daily.  3. Actos 50 mg daily.  4. Synthroid 75 mcg daily.  5.  Simvastatin 40 mg three times a week.  6. Aspirin 81 mg three times a week.   ALLERGIES:  MOTRIN.   SOCIAL HISTORY/FAMILY HISTORY/REVIEW OF SYSTEMS:  See detailed H&P.   PERTINENT PHYSICAL EXAM:  She is 5 feet 3 inches, 162 pounds.  Vital  signs within normal limits.  Generally, alert, well-developed elderly  female appearing somewhat younger than her stated age.  Pertinent  findings were limited to the abdomen which showed no masses, no  tenderness, no organomegaly.  Well-healed incision without hernias.   HOSPITAL COURSE:  The patient was admitted on the morning of her  procedure and underwent an uneventful laparoscopic-assisted right  hemicolectomy.  Her postoperative course was smooth.  Her blood sugars  were controlled with sliding scale insulin with good control.  She had  no complications.  She was started on clear liquid diet on the first  postoperative day which she tolerated well.  Her diet was able to be  gradually advanced to a regular diet and she began having bowel  movements.  She was able to get up and mobilize without difficulty and  was ready for discharge on the fifth postoperative day.  This morning  her wound was healing primarily.  Tolerating regular diet.  Abdomen was  soft and nontender.  Final pathology revealed a colonic adenocarcinoma  with negative nodes, TNM (T3 N0 with 18 benign lymph nodes).  Discharge  medications are the same as admission plus Vicodin or Tylenol as needed  for pain.  Follow up was to be in my office within one week.      [name], M.D.  Electronically Signed     BTH/MEDQ  D:  04/13/2006  T:  04/13/2006  Job:  [id number]   cc:   Ulyess Mort, MD  [address]  [place]  [place] [address]   Alfonse Alpers. Dagoberto Ligas, M.D.  Fax: [phone]

## 2010-09-27 NOTE — Op Note (Signed)
[facility]  Patient:    [name], [name]                      MRN: [id number] Proc. Date: 02/20/00 Adm. Date:  [id number] Attending:  Marlowe Kays Page                           Operative Report  NO DICTATION. DD:  02/20/00 TD:  02/21/00 Job: [id number]

## 2011-02-04 LAB — BASIC METABOLIC PANEL
CO2: 29
Chloride: 101
GFR calc Af Amer: >60
Glucose, Bld: 141 — ABNORMAL HIGH
Sodium: 138

## 2011-02-04 LAB — HEMOGLOBIN AND HEMATOCRIT, BLOOD: HCT: 37

## 2011-02-06 ENCOUNTER — Encounter: Payer: Self-pay | Admitting: Cardiology

## 2011-02-14 LAB — HEMOGLOBIN AND HEMATOCRIT, BLOOD
HCT: 32.9 — ABNORMAL LOW
Hemoglobin: 11.5 — ABNORMAL LOW

## 2011-02-14 LAB — URINALYSIS, ROUTINE W REFLEX MICROSCOPIC
Bilirubin Urine: NEGATIVE
Nitrite: NEGATIVE
Specific Gravity, Urine: 1.002 — ABNORMAL LOW
Urobilinogen, UA: 0.2
pH: 6.5

## 2011-02-14 LAB — BASIC METABOLIC PANEL
CO2: 29
Chloride: 103
GFR calc Af Amer: >60
Potassium: 3.5
Sodium: 141

## 2011-02-19 ENCOUNTER — Encounter: Payer: Self-pay | Admitting: Cardiology

## 2011-02-19 ENCOUNTER — Ambulatory Visit (INDEPENDENT_AMBULATORY_CARE_PROVIDER_SITE_OTHER): Payer: Medicare Other | Admitting: Cardiology

## 2011-02-19 VITALS — BP 134/70 | HR 87 | Ht 61.0 in | Wt 164.1 lb

## 2011-02-19 DIAGNOSIS — I2510 Atherosclerotic heart disease of native coronary artery without angina pectoris: Secondary | ICD-10-CM

## 2011-02-19 DIAGNOSIS — E119 Type 2 diabetes mellitus without complications: Secondary | ICD-10-CM

## 2011-02-19 DIAGNOSIS — I441 Atrioventricular block, second degree: Secondary | ICD-10-CM

## 2011-02-19 DIAGNOSIS — I10 Essential (primary) hypertension: Secondary | ICD-10-CM

## 2011-02-19 DIAGNOSIS — I442 Atrioventricular block, complete: Secondary | ICD-10-CM

## 2011-02-19 DIAGNOSIS — R609 Edema, unspecified: Secondary | ICD-10-CM

## 2011-02-19 MED ORDER — OLMESARTAN MEDOXOMIL 40 MG PO TABS: 40.0000 mg | ORAL_TABLET | Freq: Every day | ORAL | Status: DC

## 2011-02-19 NOTE — Assessment & Plan Note (Addendum)
She is status post pacemaker implant. She will continue followup in our pacemaker clinic. We discussed the fact that she did have an episode of a true fibrillation on her last pacemaker check which she is asymptomatic. She is at increased risk of stroke given her age, diabetes, hypertension. She states she does not want to go on Coumadin. We will reassess her atrial fibrillation burden on her next pacemaker check.

## 2011-02-19 NOTE — Assessment & Plan Note (Signed)
She is already on 40 mg of Lasix daily. Of recommended she reduce her sodium intake. If her swelling continues to be a problem we will need to discontinue her Actos.

## 2011-02-19 NOTE — Progress Notes (Signed)
[name] Date of Birth: 07/21/1916   History of Present Illness: [name] is seen for yearly followup today. She has a history of coronary disease and is status post stenting of the mid right coronary in February 2010 with a bare-metal stent. She had moderate three-vessel disease at that time. She also has a history of AV block with presyncope and has had previous pacemaker implant. On her last pacemaker check in April it was noted that she had some atrial fibrillation. She denies any palpitations. She denies any chest pain or shortness of breath. She does complain of some swelling in her feet and legs.  Current Outpatient Prescriptions on File Prior to Visit  Medication Sig Dispense Refill  . aspirin 81 MG tablet Take 81 mg by mouth daily.        . fish oil-omega-3 fatty acids 1000 MG capsule Take 2 g by mouth daily.        . furosemide (LASIX) 40 MG tablet Take 40 mg by mouth daily.       Marland Kitchen levothyroxine (SYNTHROID, LEVOTHROID) 112 MCG tablet Take 75 mcg by mouth daily.       . nitroGLYCERIN (NITROSTAT) 0.6 MG SL tablet Place 1 tablet (0.6 mg total) under the tongue every 5 (five) minutes as needed for chest pain.  90 tablet  12  . pioglitazone (ACTOS) 15 MG tablet Take 15 mg by mouth daily.        Marland Kitchen pyridoxine (B-6) 100 MG tablet Take 100 mg by mouth daily.        . simvastatin (ZOCOR) 40 MG tablet Take 40 mg by mouth at bedtime.        Marland Kitchen DISCONTD: olmesartan (BENICAR) 40 MG tablet Take 40 mg by mouth daily.        Marland Kitchen DISCONTD: Probiotic Product (ALIGN PO) Take 1 capsule by mouth daily.          Allergies  Allergen Reactions  . Hytrin (Terazosin Hcl)   . Ibuprofen   . Norvasc (Amlodipine Besylate)   . Plavix (Clopidogrel Bisulfate)   . Voltaren     Past Medical History  Diagnosis Date  . Colitis   . Colon cancer   . Diarrhea   . Hypothyroidism   . Ventral hernia   . Mobitz (type) II atrioventricular block     2nd degree  . CAD (coronary artery disease)   .  Syncopal episodes   . Diverticulosis of colon   . HLD (hyperlipidemia)   . DJD (degenerative joint disease)   . DM (diabetes mellitus)   . HTN (hypertension)     Past Surgical History  Procedure Date  . Appendectomy   . Hemicolectomy 10/07    right  . Acromionectomy with repair   . Pacemaker insertion   . Coronary angioplasty 07/03/2008  . Cardiac catheterization 08/22/1997  . Rotator cuff repair   . Transthoracic echocardiogram 06/29/2008    EF 55-60%    History  Smoking status  . Never Smoker   Smokeless tobacco  . Not on file    History  Alcohol Use No    Family History  Problem Relation Age of Onset  . Heart attack Father     Review of Systems: The review of systems is positive for increased difficulty getting up and down. She has no TIA or CVA symptoms. She has no bleeding history. All other systems were reviewed and are negative.  Physical Exam: BP 134/70  Pulse 87  Ht 5'  1" (1.549 m)  Wt 164 lb 1.9 oz (74.444 kg)  BMI 31.01 kg/m2 She is an elderly white female in no acute distress. She uses a cane to walk. She is normocephalic, atraumatic. Pupils are equal round and reactive. Sclera clear. Oropharynx is clear. Neck is supple no JVD, adenopathy, thyromegaly, or bruits. Lungs are clear. Cardiac exam reveals a grade 1/6 systolic murmur in the right upper sternal border. Abdomen is obese, soft, nontender. She has a ventral hernia. There is no organomegaly. She has 1-2+ pretibial edema. Pedal pulses are good. Skin is warm and dry. She is alert and oriented x3. Cranial nerves II through XII are intact. LABORATORY DATA:   Assessment / Plan:

## 2011-02-19 NOTE — Assessment & Plan Note (Signed)
She is status post stenting of the mid right coronary and 2010. She had prior stenting of the same vessel in 2000. She is asymptomatic at this point. We will continue aspirin and statin therapy. Continue to focus on good blood pressure control.

## 2011-02-19 NOTE — Patient Instructions (Signed)
Reduce your intake of salty food.  Continue your current medication.  If you continue to have problems with swelling we may need to stop Actos.  I will see you again in1 year

## 2011-02-20 ENCOUNTER — Ambulatory Visit: Payer: Medicare Other | Admitting: Cardiology

## 2011-03-10 ENCOUNTER — Encounter (INDEPENDENT_AMBULATORY_CARE_PROVIDER_SITE_OTHER): Payer: Self-pay | Admitting: General Surgery

## 2011-03-25 ENCOUNTER — Ambulatory Visit (INDEPENDENT_AMBULATORY_CARE_PROVIDER_SITE_OTHER): Payer: Medicare Other | Admitting: General Surgery

## 2011-03-25 ENCOUNTER — Encounter (INDEPENDENT_AMBULATORY_CARE_PROVIDER_SITE_OTHER): Payer: Self-pay | Admitting: General Surgery

## 2011-03-25 VITALS — BP 138/70 | HR 76 | Temp 96.5°F | Resp 14 | Ht 62.0 in | Wt 166.0 lb

## 2011-03-25 DIAGNOSIS — Z85038 Personal history of other malignant neoplasm of large intestine: Secondary | ICD-10-CM

## 2011-03-25 NOTE — Patient Instructions (Signed)
Call us as needed for any concerns such as abdominal pain, change in bowel habits, bleeding, or other concerns

## 2011-03-25 NOTE — Progress Notes (Signed)
Chief complaint: Follow up colon cancer  History: Patient returns for followup now 5 years status post laparoscopic assisted right hemicolectomy for T3 N0 carcinoma of the right colon. She generally has been getting along well. Bowels move regularly. She has a tiny amount of blood on the toilet tissue occasionally which is had for many years secondary to hemorrhoids. No nausea or vomiting. She gets an occasional brief stinging discomfort near her incision when reaching or twisting.  Exam: Gen.: Alert well-appearing elderly female HEENT no palpable masses. Sclera nonicteric Lymph nodes no palpable cervical or supraclavicular or inguinal nodes. Lungs: Breath sounds clear and equal Abdomen: Soft and nontender. Well-healed midline incision. No discernible masses or organomegaly. With the patient standing and doing Valsalva maneuver there is some very mild diffuse weakness of her incision but not really what I feel as a discrete hernia.  Assessment and plan: Doing well with no clinical evidence of recurrent colon cancer 5 years after treatment. I think she has a mild incisional hernia which causes some occasional minimal discomfort but not to the degree where we would recommend repair. This was all discussed with her. As she is over 5 years post diagnosis I told her that I would see her any time as needed for symptoms or concerns

## 2011-05-14 ENCOUNTER — Telehealth: Payer: Self-pay | Admitting: Cardiology

## 2011-05-14 MED ORDER — OLMESARTAN MEDOXOMIL 40 MG PO TABS: 40.0000 mg | ORAL_TABLET | Freq: Every day | ORAL | Status: DC

## 2011-05-14 NOTE — Telephone Encounter (Signed)
New refill msg Pt wants refill benicar 40 mg called to [facility] [place] [phone] Please let her know when completed

## 2011-05-14 NOTE — Telephone Encounter (Signed)
Refilled medication at [facility]

## 2011-06-24 ENCOUNTER — Telehealth: Payer: Self-pay | Admitting: Internal Medicine

## 2011-06-24 NOTE — Telephone Encounter (Signed)
05-30-11 sent past due letter/mt 06-24-11 called pt and set up pacer with allred/mt

## 2011-06-26 DIAGNOSIS — I10 Essential (primary) hypertension: Secondary | ICD-10-CM | POA: Diagnosis not present

## 2011-06-26 DIAGNOSIS — E119 Type 2 diabetes mellitus without complications: Secondary | ICD-10-CM | POA: Diagnosis not present

## 2011-06-26 DIAGNOSIS — E782 Mixed hyperlipidemia: Secondary | ICD-10-CM | POA: Diagnosis not present

## 2011-06-26 DIAGNOSIS — E039 Hypothyroidism, unspecified: Secondary | ICD-10-CM | POA: Diagnosis not present

## 2011-08-21 ENCOUNTER — Encounter: Payer: Self-pay | Admitting: Internal Medicine

## 2011-08-21 ENCOUNTER — Ambulatory Visit (INDEPENDENT_AMBULATORY_CARE_PROVIDER_SITE_OTHER): Payer: Medicare Other | Admitting: Internal Medicine

## 2011-08-21 VITALS — BP 119/76 | HR 79 | Resp 18 | Wt 164.0 lb

## 2011-08-21 DIAGNOSIS — I442 Atrioventricular block, complete: Secondary | ICD-10-CM

## 2011-08-21 DIAGNOSIS — I4891 Unspecified atrial fibrillation: Secondary | ICD-10-CM

## 2011-08-21 LAB — PACEMAKER DEVICE OBSERVATION
AL AMPLITUDE: 4 mv
RV LEAD AMPLITUDE: 15.68 mv
RV LEAD IMPEDENCE PM: 582 Ohm
RV LEAD THRESHOLD: 1.25 V
VENTRICULAR PACING PM: 100

## 2011-08-21 NOTE — Progress Notes (Signed)
Primary Cardiologist:  Dr Swaziland  The patient presents today for routine electrophysiology followup.  Since last being seen in our clinic, she reports doing very well.  She remains quite active and continues to drive.  Today, she denies symptoms of palpitations, chest pain, shortness of breath, orthopnea, PND, lower extremity edema, dizziness, presyncope, syncope, or neurologic sequela.  The patient feels that she is tolerating medications without difficulties and is otherwise without complaint today.   Past Medical History  Diagnosis Date  . Colitis   . Colon cancer   . Diarrhea   . Hypothyroidism   . Ventral hernia   . Mobitz (type) II atrioventricular block     2nd degree  . CAD (coronary artery disease)   . Syncopal episodes   . Diverticulosis of colon   . HLD (hyperlipidemia)   . DJD (degenerative joint disease)   . DM (diabetes mellitus)   . HTN (hypertension)    Past Surgical History  Procedure Date  . Appendectomy   . Hemicolectomy 10/07    right  . Acromionectomy with repair   . Pacemaker insertion   . Coronary angioplasty 07/03/2008  . Cardiac catheterization 08/22/1997  . Rotator cuff repair   . Transthoracic echocardiogram 06/29/2008    EF 55-60%    Current outpatient prescriptions:aspirin 81 MG tablet, Take 81 mg by mouth daily.  , Disp: , Rfl: ;  COD LIVER OIL PO, Take by mouth.  , Disp: , Rfl: ;  fish oil-omega-3 fatty acids 1000 MG capsule, Take 2 g by mouth daily.  , Disp: , Rfl: ;  furosemide (LASIX) 40 MG tablet, Take 40 mg by mouth daily. , Disp: , Rfl: ;  levothyroxine (SYNTHROID, LEVOTHROID) 112 MCG tablet, Take 75 mcg by mouth daily. , Disp: , Rfl:  olmesartan (BENICAR) 40 MG tablet, Take 1 tablet (40 mg total) by mouth daily., Disp: 90 tablet, Rfl: 3;  pioglitazone (ACTOS) 15 MG tablet, Take 15 mg by mouth daily.  , Disp: , Rfl: ;  pyridoxine (B-6) 100 MG tablet, Take 100 mg by mouth daily.  , Disp: , Rfl: ;  simvastatin (ZOCOR) 40 MG tablet, Take 40 mg by  mouth at bedtime.  , Disp: , Rfl:  nitroGLYCERIN (NITROSTAT) 0.6 MG SL tablet, Place 1 tablet (0.6 mg total) under the tongue every 5 (five) minutes as needed for chest pain., Disp: 90 tablet, Rfl: 12;  DISCONTD: Probiotic Product (ALIGN PO), Take 1 capsule by mouth daily.  , Disp: , Rfl:   Allergies  Allergen Reactions  . Hytrin (Terazosin Hcl)   . Ibuprofen   . Norvasc (Amlodipine Besylate)   . Plavix (Clopidogrel Bisulfate)   . Voltaren     History   Social History  . Marital Status: Widowed    Spouse Name: N/A    Number of Children: N/A  . Years of Education: N/A   Occupational History  . Not on file.   Social History Main Topics  . Smoking status: Never Smoker   . Smokeless tobacco: Never Used  . Alcohol Use: No  . Drug Use: No  . Sexually Active: Not on file   Other Topics Concern  . Not on file   Social History Narrative          She is a widow.  She lives by herself.  She did have 2        children who both died at young age.  Both of the children were invalids  from birth because of birth injuries but lived to the ages of 54 and 64        respectivel    Family History  Problem Relation Age of Onset  . Heart attack Father    Physical Exam: Filed Vitals:   08/21/11 1435  BP: 119/76  Pulse: 79  Resp: 18  Weight: 164 lb (74.39 kg)    GEN- The patient is thin elderly female, alert and oriented x 3 today.   Head- normocephalic, atraumatic Eyes-  Sclera clear, conjunctiva pink Ears- hearing intact Oropharynx- clear Neck- supple, no JVP Lymph- no cervical lymphadenopathy Lungs- Clear to ausculation bilaterally, normal work of breathing Chest- pacemaker pocket is well healed Heart- Regular rate and rhythm, no murmurs, rubs or gallops, PMI not laterally displaced GI- soft, NT, ND, + BS Extremities- no clubbing, cyanosis, or edema  Pacemaker- device interrogation reviewed, see paceart

## 2011-08-21 NOTE — Assessment & Plan Note (Signed)
Device interrogation today reveals 2 episodes of afib since last interrogation, longest episode was 5h .  Her CHADS2 score is at least 3.  She is very clear in her decision to decline anticoagulation.  She states "I know a lady who bled to death on coumadin.  I dont want those kind of medicines".  She will follow-up with Dr Swaziland.

## 2011-08-21 NOTE — Assessment & Plan Note (Signed)
Normal pacemaker function See Pace Art report No changes today  

## 2011-08-21 NOTE — Patient Instructions (Signed)
Your physician wants you to follow-up in: 6 months with device clinic and 12 months with Dr Allred You will receive a reminder letter in the mail two months in advance. If you don't receive a letter, please call our office to schedule the follow-up appointment.  

## 2011-08-25 DIAGNOSIS — M999 Biomechanical lesion, unspecified: Secondary | ICD-10-CM | POA: Diagnosis not present

## 2011-08-25 DIAGNOSIS — M5430 Sciatica, unspecified side: Secondary | ICD-10-CM | POA: Diagnosis not present

## 2011-09-02 DIAGNOSIS — M5430 Sciatica, unspecified side: Secondary | ICD-10-CM | POA: Diagnosis not present

## 2011-09-02 DIAGNOSIS — M999 Biomechanical lesion, unspecified: Secondary | ICD-10-CM | POA: Diagnosis not present

## 2011-09-09 DIAGNOSIS — M5430 Sciatica, unspecified side: Secondary | ICD-10-CM | POA: Diagnosis not present

## 2011-09-09 DIAGNOSIS — M999 Biomechanical lesion, unspecified: Secondary | ICD-10-CM | POA: Diagnosis not present

## 2011-09-16 DIAGNOSIS — M5430 Sciatica, unspecified side: Secondary | ICD-10-CM | POA: Diagnosis not present

## 2011-09-16 DIAGNOSIS — M999 Biomechanical lesion, unspecified: Secondary | ICD-10-CM | POA: Diagnosis not present

## 2011-09-25 DIAGNOSIS — Z85038 Personal history of other malignant neoplasm of large intestine: Secondary | ICD-10-CM | POA: Diagnosis not present

## 2011-09-25 DIAGNOSIS — E119 Type 2 diabetes mellitus without complications: Secondary | ICD-10-CM | POA: Diagnosis not present

## 2011-09-25 DIAGNOSIS — I10 Essential (primary) hypertension: Secondary | ICD-10-CM | POA: Diagnosis not present

## 2011-09-25 DIAGNOSIS — E782 Mixed hyperlipidemia: Secondary | ICD-10-CM | POA: Diagnosis not present

## 2011-09-25 DIAGNOSIS — E039 Hypothyroidism, unspecified: Secondary | ICD-10-CM | POA: Diagnosis not present

## 2011-09-27 ENCOUNTER — Encounter (HOSPITAL_COMMUNITY): Payer: Self-pay | Admitting: *Deleted

## 2011-09-27 ENCOUNTER — Emergency Department (INDEPENDENT_AMBULATORY_CARE_PROVIDER_SITE_OTHER)
Admission: EM | Admit: 2011-09-27 | Discharge: 2011-09-27 | Disposition: A | Discharge status: Home / Self Care | Payer: Medicare Other | Source: Home / Self Care | Attending: Emergency Medicine | Admitting: Emergency Medicine

## 2011-09-27 ENCOUNTER — Emergency Department (INDEPENDENT_AMBULATORY_CARE_PROVIDER_SITE_OTHER): Payer: Medicare Other

## 2011-09-27 DIAGNOSIS — M79609 Pain in unspecified limb: Secondary | ICD-10-CM | POA: Diagnosis not present

## 2011-09-27 DIAGNOSIS — S61209A Unspecified open wound of unspecified finger without damage to nail, initial encounter: Secondary | ICD-10-CM

## 2011-09-27 DIAGNOSIS — S61219A Laceration without foreign body of unspecified finger without damage to nail, initial encounter: Secondary | ICD-10-CM

## 2011-09-27 DIAGNOSIS — Z23 Encounter for immunization: Secondary | ICD-10-CM

## 2011-09-27 MED ORDER — TETANUS-DIPHTH-ACELL PERTUSSIS 5-2.5-18.5 LF-MCG/0.5 IM SUSP
0.5000 mL | Freq: Once | INTRAMUSCULAR | Status: AC
  Administered 2011-09-27: 0.5 mL via INTRAMUSCULAR

## 2011-09-27 MED ORDER — TETANUS-DIPHTH-ACELL PERTUSSIS 5-2.5-18.5 LF-MCG/0.5 IM SUSP
INTRAMUSCULAR | Status: AC
  Filled 2011-09-27: qty 0.5

## 2011-09-27 NOTE — ED Provider Notes (Signed)
Chief Complaint  Patient presents with  . Extremity Laceration    History of Present Illness:  [name] is a 78 year old female who closed her left little finger in her front door about 30 minutes prior to coming here. She was able to control the bleeding with pressure dressing. It's painful to touch right now. She denies any numbness or tingling.  Review of Systems:  Other than noted above, the patient denies any of the following symptoms: Systemic:  No fever or chills. Musculoskeletal:  No joint pain or decreased range of motion. Neuro:  No numbness, tingling, or weakness.  PMFSH:  Past medical history, family history, social history, meds, and allergies were reviewed.  Physical Exam:   Vital signs:  BP 139/60  Pulse 71  Temp(Src) 98 F (36.7 C) (Oral)  Resp 18  SpO2 98% Ext:  She has a flap evulsion of the tip of the left little finger. Bone was not exposed. The base of the flap is against the nail. It measures 1 cm in size in total.  All joints had a full ROM without pain.  Pulses were full.  Good capillary refill in all digits.  No edema. Neurological:  Alert and oriented.  No muscle weakness.  Sensation was intact to light touch.   Procedure: Verbal informed consent was obtained.  The patient was informed of the risks and benefits of the procedure and understands and accepts.  Identity of the patient was verified verbally and by wristband.   The laceration area described above was prepped with Betadine and anesthetized with a digital block with 5 mL of 2% Xylocaine without epinephrine.  The wound was then closed as follows:  The flap was sewed down with 6 6-0 nylon sutures.  There were no immediate complications, and the patient tolerated the procedure well. The laceration was then cleansed, Bacitracin ointment was applied and a clean, dry pressure dressing was put on.   Medications given in [facility]:  She was given a Tdap vaccine and tolerated this well without any immediate side  effects.  Assessment:  The encounter diagnosis was Finger laceration.  Plan:   1.  The following meds were prescribed:   New Prescriptions   No medications on file   2.  The patient was instructed in wound care and pain control, and handouts were given. 3.  The patient was told to return in 14 days for suture removal or wound recheck or sooner if any sign of infection.    Reuben Likes, MD 09/27/11 [phone]

## 2011-09-27 NOTE — Discharge Instructions (Signed)

## 2011-09-27 NOTE — ED Notes (Signed)
Left 5th finger closed in front door anterior tip of finger deep laceration - bleeding controlled with dressing - onset approx 30 minutes ago

## 2011-10-11 ENCOUNTER — Encounter (HOSPITAL_COMMUNITY): Payer: Self-pay | Admitting: *Deleted

## 2011-10-11 ENCOUNTER — Emergency Department (INDEPENDENT_AMBULATORY_CARE_PROVIDER_SITE_OTHER)
Admission: EM | Admit: 2011-10-11 | Discharge: 2011-10-11 | Disposition: A | Discharge status: Home / Self Care | Payer: Medicare Other | Source: Home / Self Care | Attending: Emergency Medicine | Admitting: Emergency Medicine

## 2011-10-11 DIAGNOSIS — Z4802 Encounter for removal of sutures: Secondary | ICD-10-CM | POA: Diagnosis not present

## 2011-10-11 NOTE — ED Notes (Signed)
Suture removal right 5th finger anterior tip of finger remains with swelling

## 2011-10-11 NOTE — ED Provider Notes (Addendum)
History     CSN: [id number]  Arrival date & time 10/11/11  1114   First MD Initiated Contact with Patient 10/11/11 1116      Chief Complaint  Patient presents with  . Suture / Staple Removal    (Consider location/radiation/quality/duration/timing/severity/associated sxs/prior treatment) HPI Comments: Here today to have my sutures removed from the palmar surface of her right fifth finger. He still tender and mildly swollen. No discharge, and feels better but is still tender"  The history is provided by the patient.    Past Medical History  Diagnosis Date  . Colitis   . Colon cancer   . Diarrhea   . Hypothyroidism   . Ventral hernia   . Mobitz (type) II atrioventricular block     2nd degree  . CAD (coronary artery disease)   . Syncopal episodes   . Diverticulosis of colon   . HLD (hyperlipidemia)   . DJD (degenerative joint disease)   . DM (diabetes mellitus)   . HTN (hypertension)     Past Surgical History  Procedure Date  . Appendectomy   . Hemicolectomy 10/07    right  . Acromionectomy with repair   . Pacemaker insertion   . Coronary angioplasty 07/03/2008  . Cardiac catheterization 08/22/1997  . Rotator cuff repair   . Transthoracic echocardiogram 06/29/2008    EF 55-60%    Family History  Problem Relation Age of Onset  . Heart attack Father     History  Substance Use Topics  . Smoking status: Never Smoker   . Smokeless tobacco: Never Used  . Alcohol Use: No    OB History    Grav Para Term Preterm Abortions TAB SAB Ect Mult Living                  Review of Systems  Constitutional: Positive for activity change. Negative for fever.  Skin: Positive for wound.    Allergies  Diclofenac sodium; Hytrin; Ibuprofen; Norvasc; and Plavix  Home Medications   Current Outpatient Rx  Name Route Sig Dispense Refill  . ASPIRIN 81 MG PO TABS Oral Take 81 mg by mouth daily.      . COD LIVER OIL PO Oral Take by mouth.      . OMEGA-3 FATTY ACIDS 1000 MG  PO CAPS Oral Take 2 g by mouth daily.      . FUROSEMIDE 40 MG PO TABS Oral Take 40 mg by mouth daily.     Marland Kitchen LEVOTHYROXINE SODIUM 112 MCG PO TABS Oral Take 75 mcg by mouth daily.     Marland Kitchen NITROGLYCERIN 0.6 MG SL SUBL Sublingual Place 1 tablet (0.6 mg total) under the tongue every 5 (five) minutes as needed for chest pain. 90 tablet 12  . OLMESARTAN MEDOXOMIL 40 MG PO TABS Oral Take 1 tablet (40 mg total) by mouth daily. 90 tablet 3  . PIOGLITAZONE HCL 15 MG PO TABS Oral Take 15 mg by mouth daily.      Marland Kitchen PYRIDOXINE HCL 100 MG PO TABS Oral Take 100 mg by mouth daily.      Marland Kitchen SIMVASTATIN 40 MG PO TABS Oral Take 40 mg by mouth at bedtime.        BP 171/75  Pulse 104  Temp(Src) 97.9 F (36.6 C) (Oral)  Resp 18  SpO2 95%  Physical Exam  Nursing note and vitals reviewed. Constitutional: She appears well-developed and well-nourished. No distress.  Musculoskeletal: She exhibits tenderness.       Hands:  ED Course  Procedures (including critical care time)  Labs Reviewed - No data to display No results found.   1. Visit for suture removal       MDM  Uncomplicated suture removal. No signs of secondary infection and will confronted edges. Remove about 8 stitches patient has significant degenerative arthritis of her hands with multiple deformities. Patient was advised and explained about symptoms that weren't her return has to be attentive for signs of infection. She seemed to be taking good care and following our laceration.  I don't think the wound is infected but advise patient to return to 3 days if the tenderness does not subside. She agree with this period of monitoring observation and will return sooner if any signs of infection as we discussed her        Jimmie Molly, MD 10/11/11 1442  Jimmie Molly, MD 10/11/11 [phone]

## 2011-10-11 NOTE — Discharge Instructions (Signed)
Your laceration seem to be healing well with no signs of infection. If any increased tenderness, swelling, redness or drainage return immediately. Use antibacterial soaps as dial or Lever keep area dry and keep using a topical antibiotic mainly at night. Return if any concerns     Suture Removal You have had your sutures (stitches) removed today. This means your wound has healed well enough to take out your stitches. Be careful to protect the wound area over the next several weeks. An injury this area could cause the cut to split open again. It usually takes 1-2 years for a scar to get its full strength and loose its redness. For wounds that heal slowly, tapes may be applied to reinforce the skin for several days after the stitches are removed. You may allow the sutured area to get wet. Topical antibiotics (antibiotics you put on your skin) are not usually needed at this point. Applying vitamin E oil and aloe vera ointments may help the wound heal faster and stronger. Some scars form extra pigment with exposure to sunlight during the first 6-12 months after repair. This can be prevented by using a sun block (SPF 15-30) on the affected area. Call your doctor if you have any concerns about your injury. Call right away if you have any evidence of wound infection such as increased pain, drainage, redness, or swelling. Document Released: 06/05/2004 Document Revised: 04/17/2011 Document Reviewed: 02/18/2008 [facility] Patient Information 2012 [place], [place].

## 2011-11-24 DIAGNOSIS — H3530 Unspecified macular degeneration: Secondary | ICD-10-CM | POA: Diagnosis not present

## 2011-11-29 ENCOUNTER — Emergency Department (INDEPENDENT_AMBULATORY_CARE_PROVIDER_SITE_OTHER)
Admission: EM | Admit: 2011-11-29 | Discharge: 2011-11-29 | Disposition: A | Discharge status: Home / Self Care | Payer: Medicare Other | Source: Home / Self Care

## 2011-11-29 ENCOUNTER — Encounter (HOSPITAL_COMMUNITY): Payer: Self-pay | Admitting: Emergency Medicine

## 2011-11-29 DIAGNOSIS — T148XXA Other injury of unspecified body region, initial encounter: Secondary | ICD-10-CM

## 2011-11-29 DIAGNOSIS — IMO0002 Reserved for concepts with insufficient information to code with codable children: Secondary | ICD-10-CM

## 2011-11-29 NOTE — ED Notes (Signed)
Pt stated that she had her tetanus shot 2 months ago.

## 2011-11-29 NOTE — ED Notes (Signed)
Pt stated that she was cleaning out her lawn mower yesterday afternoon and fell on a cement floor. Pt has a bruise on her forehead, left knee, left elbow and a laceration to her left anterior hand below the second finger. Pt applied polysporin and "aplinoil" on her laceration. Pt stated that her knee is the only thing that is sore. Denies loosing consciousness when falling and hitting her head.

## 2011-11-29 NOTE — ED Provider Notes (Signed)
History     CSN: [id number]  Arrival date & time 11/29/11  1106   First MD Initiated Contact with Patient 11/29/11 1150      Chief Complaint  Patient presents with  . Laceration   78 yr old lady was working int he yard  yeaterday am and cannot bedn her finger without am and fell in her [place].  She tripped and didn't  Lose conciousness and bruised herself in jultiple areas She called herneighbour and walked into the house and didn't get dizzy.  SHe has had falls recently, about 4 months ago and was treated for this.  SHe did not break anythign at that point in time. She has an appointment to see her regular doctors in about one to 2 months.  HPI  Past Medical History  Diagnosis Date  . Colitis   . Colon cancer   . Diarrhea   . Hypothyroidism   . Ventral hernia   . Mobitz (type) II atrioventricular block     2nd degree  . CAD (coronary artery disease)   . Syncopal episodes   . Diverticulosis of colon   . HLD (hyperlipidemia)   . DJD (degenerative joint disease)   . DM (diabetes mellitus)   . HTN (hypertension)     Past Surgical History  Procedure Date  . Appendectomy   . Hemicolectomy 10/07    right  . Acromionectomy with repair   . Pacemaker insertion   . Coronary angioplasty 07/03/2008  . Cardiac catheterization 08/22/1997  . Rotator cuff repair   . Transthoracic echocardiogram 06/29/2008    EF 55-60%    Family History  Problem Relation Age of Onset  . Heart attack Father     History  Substance Use Topics  . Smoking status: Never Smoker   . Smokeless tobacco: Never Used  . Alcohol Use: No    OB History    Grav Para Term Preterm Abortions TAB SAB Ect Mult Living                  Review of Systems No dizzyness and  Or CP No blurred or double vision, NO LOc No seizures-was not post-ictal Hand was bleeding quite a bit NO couggh no cold No KLE swelling Placed ice over areas Is able to flex the hand as well as extend the hand well, and bleeding  has stopped from the area. She's not had any fever or chills. She's not taken any medications for this.  Allergies  Diclofenac sodium; Hytrin; Ibuprofen; Norvasc; and Plavix  Home Medications   Current Outpatient Rx  Name Route Sig Dispense Refill  . ASPIRIN 81 MG PO TABS Oral Take 81 mg by mouth daily.      . COD LIVER OIL PO Oral Take by mouth.      . OMEGA-3 FATTY ACIDS 1000 MG PO CAPS Oral Take 2 g by mouth daily.      . FUROSEMIDE 40 MG PO TABS Oral Take 40 mg by mouth daily.     Marland Kitchen LEVOTHYROXINE SODIUM 112 MCG PO TABS Oral Take 75 mcg by mouth daily.     Marland Kitchen NITROGLYCERIN 0.6 MG SL SUBL Sublingual Place 1 tablet (0.6 mg total) under the tongue every 5 (five) minutes as needed for chest pain. 90 tablet 12  . OLMESARTAN MEDOXOMIL 40 MG PO TABS Oral Take 1 tablet (40 mg total) by mouth daily. 90 tablet 3  . PIOGLITAZONE HCL 15 MG PO TABS Oral Take 15 mg by  mouth daily.      Marland Kitchen PYRIDOXINE HCL 100 MG PO TABS Oral Take 100 mg by mouth daily.      Marland Kitchen SIMVASTATIN 40 MG PO TABS Oral Take 40 mg by mouth at bedtime.        BP 160/74  Pulse 76  Temp 98.2 F (36.8 C) (Oral)  Resp 20  SpO2 98%  Physical Exam Pleasant alert Caucasian female in no apparent distress. Looks appropriate age No icterus or pallor neck soft. Head exam she is a 2 cm linear area over the middle of the 4 head which is black and blue, she has a 2 x 3 cm area of ecchymosis over the elbow but has full range of motion of the left elbow. She has a 5 cm x 3 cm area over the right forearm volar surface She has a 6 x 8 cm area over the knee area which is swollen but she has full range of motion of that area. Mental status is intact. Patient is able to cooperate with neurological exam which is grossly normal. Over the left middle finger patient has 2 point discrimination over the affected area. There is a horizontal 2 cm wound about 2-3 mm deep. Patient is able to flex the distal interphalangeal joint and metacarpophalangeal joint  and is also able to touch finger to come.  ED Course  Procedures (including critical care time)  Labs Reviewed - No data to display No results found.   No diagnosis found.    MDM  78 year old Caucasian female presenting with simple laceration of the left middle finger without specific tendon involvement based on physical exam. Her tetanus up-to-date as of 2 months ago Patient agreed to suture of this area. Risk benefits at the same are discussed with patient especially since patient's outside 24-hour window. Patient was prepped and procedure note was done separately as though. Have recommended patient followup with outpatient hand physician for review of the same  Preprocedure  Patient underwent suture of the left middle finger with 4-0 Vicryl with 2 interrupted sutures after extensive cleansing with dilute Betadine-soaked iodine. The area was thoroughly irrigated with dilute iodine solution and cleansed with a swab.  Patient tolerated procedure well without adverse issues and was instructed to followup for further care.           Rhetta Mura, MD 11/29/11 1239

## 2011-11-30 ENCOUNTER — Emergency Department (HOSPITAL_COMMUNITY)
Admission: EM | Admit: 2011-11-30 | Discharge: 2011-11-30 | Disposition: A | Discharge status: Home / Self Care | Payer: Medicare Other | Source: Home / Self Care

## 2011-11-30 DIAGNOSIS — T8133XA Disruption of traumatic injury wound repair, initial encounter: Secondary | ICD-10-CM

## 2011-12-01 NOTE — ED Notes (Signed)
Unable to treat pt on second visit 7/21 due to loss of power at facility pt and family members decided to return home and see own physician in am or return to [facility] on 7/22 for treatment

## 2011-12-06 ENCOUNTER — Encounter (HOSPITAL_COMMUNITY): Payer: Self-pay | Admitting: Cardiology

## 2011-12-06 ENCOUNTER — Emergency Department (INDEPENDENT_AMBULATORY_CARE_PROVIDER_SITE_OTHER)
Admission: EM | Admit: 2011-12-06 | Discharge: 2011-12-06 | Disposition: A | Discharge status: Home / Self Care | Payer: Medicare Other | Source: Home / Self Care | Attending: Emergency Medicine | Admitting: Emergency Medicine

## 2011-12-06 DIAGNOSIS — IMO0002 Reserved for concepts with insufficient information to code with codable children: Secondary | ICD-10-CM

## 2011-12-06 DIAGNOSIS — L0291 Cutaneous abscess, unspecified: Secondary | ICD-10-CM

## 2011-12-06 DIAGNOSIS — Z4802 Encounter for removal of sutures: Secondary | ICD-10-CM

## 2011-12-06 DIAGNOSIS — L0390 Cellulitis, unspecified: Secondary | ICD-10-CM

## 2011-12-06 MED ORDER — CEPHALEXIN 500 MG PO CAPS: 500.0000 mg | ORAL_CAPSULE | Freq: Four times a day (QID) | ORAL | Status: AC

## 2011-12-06 NOTE — ED Provider Notes (Signed)
History     CSN: [id number]  Arrival date & time 12/06/11  1334   First MD Initiated Contact with Patient 12/06/11 1559      Chief Complaint  Patient presents with  . Leg Swelling  . Leg Pain  . Suture / Staple Removal    (Consider location/radiation/quality/duration/timing/severity/associated sxs/prior treatment) HPI Comments: Pt fell on concrete 9 days ago, causing laceration to palm of L hand, abrasions to LUE and L knee.  2 sutures placed 8 days ago, one came out 7 days ago.  Pt here to have other suture removed- took gauze off of wound today and noticed suture was coming out on its own.  Also c/o L lower anterior leg pain, swelling, redness and warmth x 6 days.  Progressively worsening.  C/o burning pain in this area.   Patient is a 78 y.o. female presenting with leg pain and suture removal. The history is provided by the patient.  Leg Pain  Incident onset: 6 days ago. The incident occurred at home. The pain is present in the left leg. The quality of the pain is described as burning. The pain is at a severity of 9/10. The pain has been worsening since onset. Pertinent negatives include no numbness, no inability to bear weight, no loss of motion and no loss of sensation. Nothing aggravates the symptoms. She has tried elevation for the symptoms. The treatment provided mild (transient mild improvement) relief.  Suture / Staple Removal  The sutures were placed 7 to 10 days ago. There has been no treatment since the wound repair. There has been no drainage from the wound. There is no redness present. There is no swelling present. The pain has no pain. She has no difficulty moving the affected extremity or digit.    Past Medical History  Diagnosis Date  . Colitis   . Colon cancer   . Diarrhea   . Hypothyroidism   . Ventral hernia   . Mobitz (type) II atrioventricular block     2nd degree  . CAD (coronary artery disease)   . Syncopal episodes   . Diverticulosis of colon   . HLD  (hyperlipidemia)   . DJD (degenerative joint disease)   . DM (diabetes mellitus)   . HTN (hypertension)     Past Surgical History  Procedure Date  . Appendectomy   . Hemicolectomy 10/07    right  . Acromionectomy with repair   . Pacemaker insertion   . Coronary angioplasty 07/03/2008  . Cardiac catheterization 08/22/1997  . Rotator cuff repair   . Transthoracic echocardiogram 06/29/2008    EF 55-60%    Family History  Problem Relation Age of Onset  . Heart attack Father     History  Substance Use Topics  . Smoking status: Never Smoker   . Smokeless tobacco: Never Used  . Alcohol Use: No    OB History    Grav Para Term Preterm Abortions TAB SAB Ect Mult Living                  Review of Systems  Constitutional: Negative for fever and chills.  Musculoskeletal:       L leg swelling  Skin: Positive for wound.       Palm laceration healing well, bruising and abrasions to LUE and LLE  Neurological: Negative for numbness.    Allergies  Diclofenac sodium; Hytrin; Ibuprofen; Norvasc; and Plavix  Home Medications   Current Outpatient Rx  Name Route Sig Dispense Refill  .  ASPIRIN 81 MG PO TABS Oral Take 81 mg by mouth daily.      . COD LIVER OIL PO Oral Take by mouth.      . FUROSEMIDE 40 MG PO TABS Oral Take 40 mg by mouth daily.     Marland Kitchen OLMESARTAN MEDOXOMIL 40 MG PO TABS Oral Take 1 tablet (40 mg total) by mouth daily. 90 tablet 3  . SIMVASTATIN 40 MG PO TABS Oral Take 40 mg by mouth at bedtime.      . CEPHALEXIN 500 MG PO CAPS Oral Take 1 capsule (500 mg total) by mouth 4 (four) times daily. 40 capsule 0  . OMEGA-3 FATTY ACIDS 1000 MG PO CAPS Oral Take 2 g by mouth daily.      Marland Kitchen LEVOTHYROXINE SODIUM 112 MCG PO TABS Oral Take 75 mcg by mouth daily.     Marland Kitchen NITROGLYCERIN 0.6 MG SL SUBL Sublingual Place 1 tablet (0.6 mg total) under the tongue every 5 (five) minutes as needed for chest pain. 90 tablet 12  . PIOGLITAZONE HCL 15 MG PO TABS Oral Take 15 mg by mouth daily.       Marland Kitchen PYRIDOXINE HCL 100 MG PO TABS Oral Take 100 mg by mouth daily.        BP 174/83  Pulse 66  Temp 97.5 F (36.4 C) (Oral)  Resp 20  SpO2 97%  Physical Exam  Constitutional: She appears well-developed and well-nourished. No distress.  Cardiovascular:  Pulses:      Dorsalis pedis pulses are 2+ on the left side.       Posterior tibial pulses are 2+ on the left side.  Pulmonary/Chest: Effort normal.  Musculoskeletal:       Left lower leg: She exhibits swelling and edema.  Skin: Skin is warm and dry. There is erythema.       Healing 1.5cm laceration palmar L hand.  No erythema, warm, tenderness, drainage.  Old healing bruising to face, L lower anterior leg.  L anterior leg with 2+ pitting edema, erythema, warmth, tenderness to palp.  Healing abrasions to L knee and L elbow.  One suture remaining in hand is untied.    ED Course  Procedures (including critical care time)  Labs Reviewed - No data to display No results found.   1. Cellulitis   2. Dressing change/suture removal       MDM  Pulled the untied suture out of hand.  Applied 3 steri strips at pt's request.  Outlined erythematous area of L lower leg with marker. Pt to monitor and f/u with pcp, or go to ER if it becomes more severe.        Cathlyn Parsons, NP 12/06/11 [phone]

## 2011-12-06 NOTE — ED Notes (Signed)
Pt here for suture removal from left hand. No s/s of infection noted to left hand. Bruising noted to palm of hand and left ring finger. Pt also here to have left leg rechecked. She has abrasion to the knee that occurred last Saturday after fall. Now she has swelling to left LE with bruising and redness to the anterior portion of the left LE. Pt reports discomfort to the ankle/foot crease. Pt states swelling is better in the morning upon rising. Denies fever but left leg feels warm to touch. Pt describes discomfort to be burning.

## 2011-12-07 NOTE — ED Provider Notes (Signed)
Medical screening examination/treatment/procedure(s) were performed by non-physician practitioner and as supervising physician I was immediately available for consultation/collaboration.  Leslee Home, M.D.   Reuben Likes, MD 12/07/11 1100

## 2011-12-08 DIAGNOSIS — L0291 Cutaneous abscess, unspecified: Secondary | ICD-10-CM | POA: Diagnosis not present

## 2011-12-08 DIAGNOSIS — L0390 Cellulitis, unspecified: Secondary | ICD-10-CM | POA: Diagnosis not present

## 2011-12-20 ENCOUNTER — Emergency Department (HOSPITAL_BASED_OUTPATIENT_CLINIC_OR_DEPARTMENT_OTHER): Payer: Medicare Other

## 2011-12-20 ENCOUNTER — Emergency Department (HOSPITAL_BASED_OUTPATIENT_CLINIC_OR_DEPARTMENT_OTHER)
Admission: EM | Admit: 2011-12-20 | Discharge: 2011-12-20 | Disposition: A | Discharge status: Home / Self Care | Payer: Medicare Other | Attending: Emergency Medicine | Admitting: Emergency Medicine

## 2011-12-20 ENCOUNTER — Encounter (HOSPITAL_BASED_OUTPATIENT_CLINIC_OR_DEPARTMENT_OTHER): Payer: Self-pay | Admitting: Emergency Medicine

## 2011-12-20 DIAGNOSIS — Z950 Presence of cardiac pacemaker: Secondary | ICD-10-CM | POA: Insufficient documentation

## 2011-12-20 DIAGNOSIS — I10 Essential (primary) hypertension: Secondary | ICD-10-CM | POA: Insufficient documentation

## 2011-12-20 DIAGNOSIS — E785 Hyperlipidemia, unspecified: Secondary | ICD-10-CM | POA: Insufficient documentation

## 2011-12-20 DIAGNOSIS — Z7902 Long term (current) use of antithrombotics/antiplatelets: Secondary | ICD-10-CM | POA: Insufficient documentation

## 2011-12-20 DIAGNOSIS — M25559 Pain in unspecified hip: Secondary | ICD-10-CM | POA: Diagnosis not present

## 2011-12-20 DIAGNOSIS — Z79899 Other long term (current) drug therapy: Secondary | ICD-10-CM | POA: Insufficient documentation

## 2011-12-20 DIAGNOSIS — Z85038 Personal history of other malignant neoplasm of large intestine: Secondary | ICD-10-CM | POA: Diagnosis not present

## 2011-12-20 DIAGNOSIS — E119 Type 2 diabetes mellitus without complications: Secondary | ICD-10-CM | POA: Insufficient documentation

## 2011-12-20 DIAGNOSIS — M25551 Pain in right hip: Secondary | ICD-10-CM

## 2011-12-20 DIAGNOSIS — E039 Hypothyroidism, unspecified: Secondary | ICD-10-CM | POA: Diagnosis not present

## 2011-12-20 DIAGNOSIS — I2510 Atherosclerotic heart disease of native coronary artery without angina pectoris: Secondary | ICD-10-CM | POA: Insufficient documentation

## 2011-12-20 MED ORDER — TRAMADOL HCL 50 MG PO TABS: 50.0000 mg | ORAL_TABLET | Freq: Four times a day (QID) | ORAL | Status: AC | PRN

## 2011-12-20 MED ORDER — TRAMADOL HCL 50 MG PO TABS
ORAL_TABLET | ORAL | Status: AC
  Administered 2011-12-20: 50 mg
  Filled 2011-12-20: qty 1

## 2011-12-20 NOTE — ED Provider Notes (Signed)
History     CSN: [id number]  Arrival date & time 12/20/11  1003   First MD Initiated Contact with Patient 12/20/11 1021      Chief Complaint  Patient presents with  . Hip Pain    (Consider location/radiation/quality/duration/timing/severity/associated sxs/prior treatment) HPI Patient is a 78 year old female who presents today complaining of right hip pain that began after trying to kick off a skirt last night while standing. Patient lives independently and ambulate with a cane at baseline. Patient became tangled in her close last night and kicked off her scar with her right leg. She did not fall and did not strike her right hip. This morning when she awoke she had severe right hip pain with walking or moving. This is fine and she is at rest. She has not taken anything for pain. Patient reports that she has only 2/10 pain with sitting still but as an ache. There are no other associated or modifying factors. Past Medical History  Diagnosis Date  . Colitis   . Colon cancer   . Diarrhea   . Hypothyroidism   . Ventral hernia   . Mobitz (type) II atrioventricular block     2nd degree  . CAD (coronary artery disease)   . Syncopal episodes   . Diverticulosis of colon   . HLD (hyperlipidemia)   . DJD (degenerative joint disease)   . DM (diabetes mellitus)   . HTN (hypertension)     Past Surgical History  Procedure Date  . Appendectomy   . Hemicolectomy 10/07    right  . Acromionectomy with repair   . Pacemaker insertion   . Coronary angioplasty 07/03/2008  . Cardiac catheterization 08/22/1997  . Rotator cuff repair   . Transthoracic echocardiogram 06/29/2008    EF 55-60%    Family History  Problem Relation Age of Onset  . Heart attack Father     History  Substance Use Topics  . Smoking status: Never Smoker   . Smokeless tobacco: Never Used  . Alcohol Use: No    OB History    Grav Para Term Preterm Abortions TAB SAB Ect Mult Living                  Review of  Systems  Constitutional: Negative.   HENT: Negative.   Eyes: Negative.   Respiratory: Negative.   Cardiovascular: Negative.   Gastrointestinal: Negative.   Genitourinary: Negative.   Musculoskeletal:       Right hip pain  Skin: Negative.   Neurological: Negative.   Hematological: Negative.   Psychiatric/Behavioral: Negative.   All other systems reviewed and are negative.    Allergies  Diclofenac sodium; Hytrin; Ibuprofen; Norvasc; and Plavix  Home Medications   Current Outpatient Rx  Name Route Sig Dispense Refill  . ASPIRIN 81 MG PO TABS Oral Take 81 mg by mouth daily.      . COD LIVER OIL PO Oral Take by mouth.      . OMEGA-3 FATTY ACIDS 1000 MG PO CAPS Oral Take 2 g by mouth daily.      . FUROSEMIDE 40 MG PO TABS Oral Take 40 mg by mouth daily.     Marland Kitchen LEVOTHYROXINE SODIUM 112 MCG PO TABS Oral Take 75 mcg by mouth daily.     Marland Kitchen NITROGLYCERIN 0.6 MG SL SUBL Sublingual Place 1 tablet (0.6 mg total) under the tongue every 5 (five) minutes as needed for chest pain. 90 tablet 12  . OLMESARTAN MEDOXOMIL 40 MG PO TABS  Oral Take 1 tablet (40 mg total) by mouth daily. 90 tablet 3  . PIOGLITAZONE HCL 15 MG PO TABS Oral Take 15 mg by mouth daily.      Marland Kitchen PYRIDOXINE HCL 100 MG PO TABS Oral Take 100 mg by mouth daily.      Marland Kitchen SIMVASTATIN 40 MG PO TABS Oral Take 40 mg by mouth at bedtime.      . TRAMADOL HCL 50 MG PO TABS Oral Take 1 tablet (50 mg total) by mouth every 6 (six) hours as needed for pain. 30 tablet 0    BP 136/52  Pulse 70  Temp 97.8 F (36.6 C) (Oral)  Resp 18  SpO2 97%  Physical Exam  Nursing note and vitals reviewed. GEN: Well-developed, well-nourished female in no distress HEENT: Atraumatic, normocephalic.  EYES: PERRLA BL, no scleral icterus. Neuro: cranial nerves grossly 2-12 intact, no abnormalities of strength or sensation, A and O x 3 MSK: Patient with tenderness to palpation over the right sacroiliac joint. She has no pain on logroll on the right. Otherwise  patient is off or extremity symmetrically. No tenderness to palpation over the lumbar spine. Skin: No rashes petechiae, purpura, or jaundice Psych: no abnormality of mood   ED Course  Procedures (including critical care time)  Labs Reviewed - No data to display Dg Hip Complete Right  12/20/2011  *RADIOLOGY REPORT*  Clinical Data: Posterior right hip pain  RIGHT HIP - COMPLETE 2+ VIEW  Comparison: None.  Findings: No fracture or dislocation is seen.  Mild symmetric joint space narrowing.  Degenerative changes of the lower lumbar spine.  Ventral hernia mesh repair.  IMPRESSION: No fracture or dislocation is seen.  Mild symmetric joint space narrowing.  Original Report Authenticated By: Charline Bills, M.D.     1. Right hip pain       MDM  Patient was evaluated by myself. I have low suspicion for bony injury however given the patient's age and did feel that plain film was appropriate. There were no evidence of fracture. Patient was treated with tramadol here and was able to ambulate following this returned negative film. She was discharged with prescription for this. I no concern for urinary pathology or intra-abdominal pathology based on patient's presentation. She also had no findings to suggest pathology in the lumbar spine. Patient was discharged in good condition.        Cyndra Numbers, MD 12/20/11 1150

## 2011-12-20 NOTE — ED Notes (Signed)
Patient transported to X-ray via stretcher 

## 2011-12-20 NOTE — ED Notes (Signed)
Pt reports right hip/back pain that started after twisting extremity last pm.  Pain is worse with weight bearing.

## 2011-12-20 NOTE — ED Notes (Signed)
MD at bedside. 

## 2011-12-20 NOTE — ED Notes (Signed)
Family at bedside. 

## 2011-12-24 ENCOUNTER — Other Ambulatory Visit (HOSPITAL_BASED_OUTPATIENT_CLINIC_OR_DEPARTMENT_OTHER): Payer: Self-pay | Admitting: Internal Medicine

## 2011-12-24 DIAGNOSIS — M25559 Pain in unspecified hip: Secondary | ICD-10-CM | POA: Diagnosis not present

## 2011-12-24 DIAGNOSIS — M79605 Pain in left leg: Secondary | ICD-10-CM

## 2011-12-24 DIAGNOSIS — M79609 Pain in unspecified limb: Secondary | ICD-10-CM | POA: Diagnosis not present

## 2011-12-24 DIAGNOSIS — E119 Type 2 diabetes mellitus without complications: Secondary | ICD-10-CM | POA: Diagnosis not present

## 2011-12-25 ENCOUNTER — Ambulatory Visit (HOSPITAL_BASED_OUTPATIENT_CLINIC_OR_DEPARTMENT_OTHER)
Admission: RE | Admit: 2011-12-25 | Discharge: 2011-12-25 | Disposition: A | Discharge status: Home / Self Care | Payer: Medicare Other | Source: Ambulatory Visit | Attending: Internal Medicine | Admitting: Internal Medicine

## 2011-12-25 ENCOUNTER — Other Ambulatory Visit (HOSPITAL_BASED_OUTPATIENT_CLINIC_OR_DEPARTMENT_OTHER): Payer: Medicare Other

## 2011-12-25 DIAGNOSIS — M79605 Pain in left leg: Secondary | ICD-10-CM

## 2011-12-25 DIAGNOSIS — M79609 Pain in unspecified limb: Secondary | ICD-10-CM | POA: Diagnosis not present

## 2012-01-07 DIAGNOSIS — L538 Other specified erythematous conditions: Secondary | ICD-10-CM | POA: Diagnosis not present

## 2012-01-07 DIAGNOSIS — M7989 Other specified soft tissue disorders: Secondary | ICD-10-CM | POA: Diagnosis not present

## 2012-01-21 DIAGNOSIS — Z23 Encounter for immunization: Secondary | ICD-10-CM | POA: Diagnosis not present

## 2012-02-12 ENCOUNTER — Encounter: Payer: Self-pay | Admitting: *Deleted

## 2012-02-12 DIAGNOSIS — Z950 Presence of cardiac pacemaker: Secondary | ICD-10-CM | POA: Insufficient documentation

## 2012-02-18 ENCOUNTER — Ambulatory Visit (INDEPENDENT_AMBULATORY_CARE_PROVIDER_SITE_OTHER): Payer: Medicare Other | Admitting: *Deleted

## 2012-02-18 DIAGNOSIS — I442 Atrioventricular block, complete: Secondary | ICD-10-CM | POA: Diagnosis not present

## 2012-02-18 LAB — PACEMAKER DEVICE OBSERVATION
AL AMPLITUDE: 4 mv
AL IMPEDENCE PM: 418 Ohm
ATRIAL PACING PM: 30
RV LEAD IMPEDENCE PM: 605 Ohm
RV LEAD THRESHOLD: 1 V
VENTRICULAR PACING PM: 99

## 2012-02-18 NOTE — Patient Instructions (Addendum)
Follow up with Dr. Johney Frame in 6 months.  We will send you a reminder letter to make an appointment in April 2014.

## 2012-02-18 NOTE — Progress Notes (Signed)
PPM check 

## 2012-02-20 ENCOUNTER — Ambulatory Visit (INDEPENDENT_AMBULATORY_CARE_PROVIDER_SITE_OTHER): Payer: Medicare Other | Admitting: Cardiology

## 2012-02-20 ENCOUNTER — Encounter: Payer: Self-pay | Admitting: Cardiology

## 2012-02-20 VITALS — BP 122/64 | HR 74 | Ht 62.0 in | Wt 175.8 lb

## 2012-02-20 DIAGNOSIS — I442 Atrioventricular block, complete: Secondary | ICD-10-CM

## 2012-02-20 DIAGNOSIS — E785 Hyperlipidemia, unspecified: Secondary | ICD-10-CM

## 2012-02-20 DIAGNOSIS — I2510 Atherosclerotic heart disease of native coronary artery without angina pectoris: Secondary | ICD-10-CM

## 2012-02-20 DIAGNOSIS — I10 Essential (primary) hypertension: Secondary | ICD-10-CM

## 2012-02-20 MED ORDER — NITROGLYCERIN 0.6 MG SL SUBL: 0.6000 mg | SUBLINGUAL_TABLET | SUBLINGUAL | Status: DC | PRN

## 2012-02-20 NOTE — Progress Notes (Signed)
[name] Date of Birth: 28-Jun-1916   History of Present Illness: [name] is seen for yearly followup today. [name] has a history of coronary disease and is status post stenting of the mid right coronary in February 2010 with a bare-metal stent. [name] had moderate three-vessel disease at that time. [name] also has a history of AV block with presyncope and has had previous pacemaker implant. [name] complains that [name] gives out much easier than before. [name] really can't do much anymore. [name] still walks with the assistance of a cane. [name] had a fall a couple of months ago when [name] was trying to mow her grass and had a laceration and hematoma on her left knee. [name] realizes that [name] can no longer do this. [name] denies any significant chest pain or shortness of breath. [name]'s had no edema.  Current Outpatient Prescriptions on File Prior to Visit  Medication Sig Dispense Refill  . aspirin 81 MG tablet Take 81 mg by mouth daily.        . COD LIVER OIL PO Take by mouth.        . furosemide (LASIX) 40 MG tablet Take 40 mg by mouth daily.       Marland Kitchen levothyroxine (SYNTHROID, LEVOTHROID) 112 MCG tablet Take 75 mcg by mouth daily.       Marland Kitchen olmesartan (BENICAR) 40 MG tablet Take 1 tablet (40 mg total) by mouth daily.  90 tablet  3  . simvastatin (ZOCOR) 40 MG tablet Take 40 mg by mouth at bedtime.        Marland Kitchen DISCONTD: nitroGLYCERIN (NITROSTAT) 0.6 MG SL tablet Place 1 tablet (0.6 mg total) under the tongue every 5 (five) minutes as needed for chest pain.  90 tablet  12  . DISCONTD: Probiotic Product (ALIGN PO) Take 1 capsule by mouth daily.          Allergies  Allergen Reactions  . Diclofenac Sodium   . Hytrin (Terazosin Hcl)   . Ibuprofen   . Norvasc (Amlodipine Besylate)   . Plavix (Clopidogrel Bisulfate)     Past Medical History  Diagnosis Date  . Colitis   . Colon cancer   . Diarrhea   . Hypothyroidism   . Ventral hernia   . Mobitz (type) II atrioventricular block     2nd degree  . CAD (coronary  artery disease)   . Syncopal episodes   . Diverticulosis of colon   . HLD (hyperlipidemia)   . DJD (degenerative joint disease)   . DM (diabetes mellitus)   . HTN (hypertension)     Past Surgical History  Procedure Date  . Appendectomy   . Hemicolectomy 10/07    right  . Acromionectomy with repair   . Pacemaker insertion   . Coronary angioplasty 07/03/2008  . Cardiac catheterization 08/22/1997  . Rotator cuff repair   . Transthoracic echocardiogram 06/29/2008    EF 55-60%    History  Smoking status  . Never Smoker   Smokeless tobacco  . Never Used    History  Alcohol Use No    Family History  Problem Relation Age of Onset  . Heart attack Father     Review of Systems: The review of systems is positive for increased difficulty getting up and down. [name] has no TIA or CVA symptoms. [name] has no bleeding history. All other systems were reviewed and are negative.  Physical Exam: BP 122/64  Pulse 74  Ht 5\' 2"  (1.575 m)  Wt 79.742 kg (  175 lb 12.8 oz)  BMI 32.15 kg/m2 [name] is an elderly white female in no acute distress. [name] uses a cane to walk. [name] is normocephalic, atraumatic. Pupils are equal round and reactive. Sclera clear. Oropharynx is clear. Neck is supple no JVD, adenopathy, thyromegaly, or bruits. Lungs are clear. Cardiac exam reveals a grade 1/6 systolic murmur in the right upper sternal border. Abdomen is obese, soft, nontender. [name] has a ventral hernia. There is no organomegaly. [name] has 1-2+ pretibial edema. Pedal pulses are good. Skin is warm and dry. [name] is alert and oriented x3. Cranial nerves II through XII are intact. LABORATORY DATA: ECG today demonstrates normal sinus rhythm with atrial A. sensing and ventricular pacing.  Assessment / Plan: 1. Coronary disease status post stenting of the mid RCA in February 2010 with a bare-metal stent. His moderate three-vessel disease. [name] is asymptomatic. We will continue with aspirin only. Continue statin therapy. I'll  followup again in one year.  2. Complete heart block status post pacemaker implant. Followed regularly in our pacemaker clinic.  3. Hypertension, controlled.

## 2012-02-20 NOTE — Patient Instructions (Signed)
Continue your current therapy  I will see you again in one year.   

## 2012-03-08 ENCOUNTER — Encounter: Payer: Self-pay | Admitting: Internal Medicine

## 2012-03-11 DIAGNOSIS — M19049 Primary osteoarthritis, unspecified hand: Secondary | ICD-10-CM | POA: Diagnosis not present

## 2012-04-02 DIAGNOSIS — I10 Essential (primary) hypertension: Secondary | ICD-10-CM | POA: Diagnosis not present

## 2012-04-02 DIAGNOSIS — M1710 Unilateral primary osteoarthritis, unspecified knee: Secondary | ICD-10-CM | POA: Diagnosis not present

## 2012-04-02 DIAGNOSIS — L03119 Cellulitis of unspecified part of limb: Secondary | ICD-10-CM | POA: Diagnosis not present

## 2012-04-02 DIAGNOSIS — Z780 Asymptomatic menopausal state: Secondary | ICD-10-CM | POA: Diagnosis not present

## 2012-04-02 DIAGNOSIS — E119 Type 2 diabetes mellitus without complications: Secondary | ICD-10-CM | POA: Diagnosis not present

## 2012-04-02 DIAGNOSIS — E039 Hypothyroidism, unspecified: Secondary | ICD-10-CM | POA: Diagnosis not present

## 2012-04-02 DIAGNOSIS — E785 Hyperlipidemia, unspecified: Secondary | ICD-10-CM | POA: Diagnosis not present

## 2012-04-13 DIAGNOSIS — M1710 Unilateral primary osteoarthritis, unspecified knee: Secondary | ICD-10-CM | POA: Diagnosis not present

## 2012-06-01 DIAGNOSIS — Z780 Asymptomatic menopausal state: Secondary | ICD-10-CM | POA: Diagnosis not present

## 2012-07-15 DIAGNOSIS — Z23 Encounter for immunization: Secondary | ICD-10-CM | POA: Diagnosis not present

## 2012-08-13 ENCOUNTER — Encounter: Payer: Self-pay | Admitting: Internal Medicine

## 2012-08-13 ENCOUNTER — Encounter: Payer: Self-pay | Admitting: Cardiology

## 2012-08-13 ENCOUNTER — Other Ambulatory Visit: Payer: Self-pay | Admitting: *Deleted

## 2012-08-13 ENCOUNTER — Ambulatory Visit (INDEPENDENT_AMBULATORY_CARE_PROVIDER_SITE_OTHER): Payer: Medicare Other | Admitting: Cardiology

## 2012-08-13 VITALS — BP 137/72 | HR 96 | Ht 62.0 in | Wt 156.0 lb

## 2012-08-13 DIAGNOSIS — I4891 Unspecified atrial fibrillation: Secondary | ICD-10-CM

## 2012-08-13 DIAGNOSIS — I442 Atrioventricular block, complete: Secondary | ICD-10-CM | POA: Diagnosis not present

## 2012-08-13 DIAGNOSIS — Z950 Presence of cardiac pacemaker: Secondary | ICD-10-CM

## 2012-08-13 LAB — PACEMAKER DEVICE OBSERVATION
ATRIAL PACING PM: 40.9
BRDY-0002RV: 60 {beats}/min
BRDY-0003RV: 130 {beats}/min
BRDY-0004RV: 130 {beats}/min
RV LEAD THRESHOLD: 1 V
VENTRICULAR PACING PM: 98.6

## 2012-08-13 LAB — BASIC METABOLIC PANEL
BUN: 17 mg/dL (ref 6–23)
Chloride: 101 mEq/L (ref 96–112)
GFR: 80.29 mL/min (ref >60.00)
Glucose, Bld: 187 mg/dL — ABNORMAL HIGH (ref 70–99)
Potassium: 3.8 mEq/L (ref 3.5–5.1)
Sodium: 136 mEq/L (ref 135–145)

## 2012-08-13 MED ORDER — APIXABAN 5 MG PO TABS: 5.0000 mg | ORAL_TABLET | Freq: Two times a day (BID) | ORAL | Status: DC

## 2012-08-13 NOTE — Progress Notes (Signed)
ELECTROPHYSIOLOGY OFFICE NOTE  Patient ID: [name] MRN: [id number], DOB/AGE: 1916-09-13   Date of Visit: 08/13/2012  Primary Physician: Elby Showers, MD Primary Cardiologist: [place], MD Primary EP: Johney Frame, MD Reason for Visit: EP/device follow-up  History of Present Illness  [name] is a pleasant 79 year old woman with Mobitz II AV block s/p PPM implant, PAF and CAD who presents today for routine electrophysiology followup. Since last being seen in our clinic, she reports she is doing well. She has no complaints. Today, she specifically denies chest pain or shortness of breath. She denies palpitations, dizziness, near syncope or syncope. She denies LE swelling, orthopnea, PND or recent weight gain. [name] reports she is compliant and tolerating medications without difficulty.  Past Medical History Past Medical History  Diagnosis Date  . Colitis   . Colon cancer   . Diarrhea   . Hypothyroidism   . Ventral hernia   . Mobitz (type) II atrioventricular block     2nd degree  . CAD (coronary artery disease)   . Syncopal episodes   . Diverticulosis of colon   . HLD (hyperlipidemia)   . DJD (degenerative joint disease)   . DM (diabetes mellitus)   . HTN (hypertension)     Past Surgical History Past Surgical History  Procedure Laterality Date  . Appendectomy    . Hemicolectomy  10/07    right  . Acromionectomy with repair    . Pacemaker insertion    . Coronary angioplasty  07/03/2008  . Cardiac catheterization  08/22/1997  . Rotator cuff repair    . Transthoracic echocardiogram  06/29/2008    EF 55-60%     Allergies/Intolerances Allergies  Allergen Reactions  . Diclofenac Sodium   . Hytrin (Terazosin Hcl)   . Ibuprofen   . Norvasc (Amlodipine Besylate)   . Plavix (Clopidogrel Bisulfate)     Current Home Medications Current Outpatient Prescriptions  Medication Sig Dispense Refill  . aspirin 81 MG tablet Take 81 mg by mouth daily.        . COD  LIVER OIL PO Take by mouth.        . furosemide (LASIX) 40 MG tablet Take 40 mg by mouth daily.       Marland Kitchen levothyroxine (SYNTHROID, LEVOTHROID) 112 MCG tablet Take 75 mcg by mouth daily.       . nitroGLYCERIN (NITROSTAT) 0.6 MG SL tablet Place 1 tablet (0.6 mg total) under the tongue every 5 (five) minutes as needed for chest pain.  90 tablet  12  . olmesartan (BENICAR) 40 MG tablet Take 1 tablet (40 mg total) by mouth daily.  90 tablet  3  . simvastatin (ZOCOR) 40 MG tablet Take 40 mg by mouth at bedtime.        . [DISCONTINUED] Probiotic Product (ALIGN PO) Take 1 capsule by mouth daily.         No current facility-administered medications for this visit.    Social History Social History  . Marital Status: Widowed   Social History Main Topics  . Smoking status: Never Smoker   . Smokeless tobacco: Never Used  . Alcohol Use: No  . Drug Use: No   Social History Narrative          She is a widow.  She lives by herself.  She did have 2           children who both died at young age.  Both of the children were invalids  from birth because of birth injuries but lived to the ages of 69 and 55           respectivel    Review of Systems General: No chills, fever, night sweats or weight changes Cardiovascular: No chest pain, dyspnea on exertion, edema, orthopnea, palpitations, paroxysmal nocturnal dyspnea Dermatological: No rash, lesions or masses Respiratory: No cough, dyspnea Urologic: No hematuria, dysuria Abdominal: No nausea, vomiting, diarrhea, bright red blood per rectum, melena, or hematemesis Neurologic: No visual changes, weakness, changes in mental status All other systems reviewed and are otherwise negative except as noted above.  Physical Exam Blood pressure 137/72, pulse 96, height 5\' 2"  (1.575 m), weight 156 lb (70.761 kg), SpO2 98.00%.  General: Well developed, well appearing, elderly 79 year old female in no acute distress. HEENT: Normocephalic, atraumatic. EOMs  intact. Sclera nonicteric. Oropharynx clear.  Neck: Supple. No JVD. Lungs: Respirations regular and unlabored, CTA bilaterally. No wheezes, rales or rhonchi. Heart: S1, S2 present. No murmurs, rub, S3 or S4. Abdomen: Soft, non-distended.  Extremities: No clubbing, cyanosis or edema. DP/PT/Radials 2+ and equal bilaterally. Psych: Normal affect. Neuro: Alert and oriented X 3. Moves all extremities spontaneously.   Diagnostics Device interrogation today - Normal device function. Thresholds, sensing, impedances consistent with previous measurements. Device programmed to maximize longevity. 11 mode switches, 86% of the time. No high ventricular rates noted. Device programmed at appropriate safety margins. Histogram distribution appropriate for patient activity level. Device programmed to optimize intrinsic conduction. Estimated longevity 6 years  Assessment and Plan 1. Atrial fibrillation Asymptomatic, overall rate controlled Discussed need for anticoagulation as her CHADS score is at least 3 (age, HTN, vascular disease) Will check BMET today and start Eliquis pending results of serum creatinine Stop aspirin Follow-up with Weston Brass, PharmD in 4 weeks 2. Mobitz II AV block s/p PPM Normal device function Continue routine device follow-up in device clinic every 6 months Return to clinic for follow-up with Dr. Johney Frame in one year  This plan of care was formulated with Dr. Johney Frame who was in to see the patient with me. Signed, Rick Duff, PA-C 08/13/2012, 10:28 AM

## 2012-08-13 NOTE — Patient Instructions (Addendum)
Your physician recommends that you schedule a follow-up appointment in: 6 months with device clinic  Your physician wants you to follow-up in: 1 year with Dr Johney Frame. You will receive a reminder letter in the mail two months in advance. If you don't receive a letter, please call our office to schedule the follow-up appointment.  Your physician recommends that you return for lab work drawn today (BMP)  Your physician has recommended you make the following change in your medication: START Eloquis daily -- we will call you and let you know how much Your physician recommends that you schedule a follow-up appointment in: 4 weeks with Kennon Rounds our Pharmacist regarding the Eloquis

## 2012-08-17 ENCOUNTER — Telehealth: Payer: Self-pay | Admitting: Cardiology

## 2012-08-17 NOTE — Telephone Encounter (Signed)
Spoke to patient was told I called [employer] [facility] and eliquis was approved for 1 year and will cost $40.00 every month.Patient stated she cannot afford eliquis.Patient was told will fill out a drug assistant form from [employer].Advised to bring to office 08/18/12 proof of her income and she will have to sign this form.I will fax form to see if she qualifies.Also was told will give her samples of eliquis tomorrow.

## 2012-08-17 NOTE — Telephone Encounter (Signed)
New Prob    Pt states she is not taking her blood thinner (ELIQUIS). She said is unable to afford all the pills she has to take. Would like to speak to nurse.

## 2012-08-18 NOTE — Telephone Encounter (Signed)
[name] patient assistance form for eliquis completed and faxed to [phone].

## 2012-09-09 ENCOUNTER — Ambulatory Visit: Payer: Medicare Other | Admitting: Pharmacist

## 2012-09-20 ENCOUNTER — Ambulatory Visit: Payer: Medicare Other | Admitting: Pharmacist

## 2012-09-20 ENCOUNTER — Ambulatory Visit (INDEPENDENT_AMBULATORY_CARE_PROVIDER_SITE_OTHER): Payer: Medicare Other | Admitting: *Deleted

## 2012-09-20 DIAGNOSIS — I4891 Unspecified atrial fibrillation: Secondary | ICD-10-CM

## 2012-09-20 LAB — CBC WITH DIFFERENTIAL/PLATELET
Basophils Absolute: 0 10*3/uL (ref 0.0–0.1)
Eosinophils Relative: 3.2 % (ref 0.0–5.0)
HCT: 35.9 % — ABNORMAL LOW (ref 36.0–46.0)
Hemoglobin: 12.2 g/dL (ref 12.0–15.0)
Lymphs Abs: 1.9 10*3/uL (ref 0.7–4.0)
MCV: 87.7 fl (ref 78.0–100.0)
Monocytes Absolute: 0.5 10*3/uL (ref 0.1–1.0)
Monocytes Relative: 6.8 % (ref 3.0–12.0)
Neutro Abs: 4.6 10*3/uL (ref 1.4–7.7)
RDW: 15.5 % — ABNORMAL HIGH (ref 11.5–14.6)

## 2012-09-20 LAB — BASIC METABOLIC PANEL
CO2: 27 mEq/L (ref 19–32)
Calcium: 9 mg/dL (ref 8.4–10.5)
Creatinine, Ser: 0.8 mg/dL (ref 0.4–1.2)
GFR: 73.19 mL/min (ref >60.00)
Glucose, Bld: 141 mg/dL — ABNORMAL HIGH (ref 70–99)

## 2012-09-20 MED ORDER — APIXABAN 5 MG PO TABS: 5.0000 mg | ORAL_TABLET | Freq: Two times a day (BID) | ORAL | Status: DC

## 2012-09-20 NOTE — Patient Instructions (Addendum)
Patient talked extensively regarding cost of medication, I informed her $40.00 co-pay and she states she can afford that, script is at [facility], she has 3 tablets left, we discussed signs and symptoms to watch for, sent to lab for cbc with diff and bmet.

## 2012-09-20 NOTE — Progress Notes (Signed)
Pt was started on Eliquis 5 mg bid for AF on 08/13/2012  4/4 [place] 0.7  Reviewed patients medication list.  Pt currently not on any combined P-gp and strong CYP3A4 inhibitors/inducers (ketoconazole, traconazole, ritonavir, carbamazepine, phenytoin, rifampin, St. John's wort).  Reviewed labs.  SCr 0.8 (09/20/2012)  Hgb and HCT 12.2 and 35.9 (09/20/2012)  A full discussion of the nature of anticoagulants has been carried out.  A benefit/risk analysis has been presented to the patient, so that they understand the justification for choosing anticoagulation with Eliquis at this time.  The need for compliance is stressed.  Pt is aware to take the medication twice daily.  Side effects of potential bleeding are discussed, including unusual colored urine or stools, coughing up blood or coffee ground emesis, nose bleeds or serious fall or head trauma.  Discussed signs and symptoms of stroke. The patient should avoid any OTC items containing aspirin or ibuprofen.  Avoid alcohol consumption.   Call if any signs of abnormal bleeding.  Discussed financial obligations and resolved any difficulty in obtaining medication.  Next lab test test in 1 month.

## 2012-09-21 NOTE — Progress Notes (Signed)
Called and spoke with pt giving her lab results from yesterday, set her up f/u appt in June 2014

## 2012-10-18 ENCOUNTER — Ambulatory Visit (INDEPENDENT_AMBULATORY_CARE_PROVIDER_SITE_OTHER): Payer: Medicare Other | Admitting: *Deleted

## 2012-10-18 DIAGNOSIS — I4891 Unspecified atrial fibrillation: Secondary | ICD-10-CM | POA: Diagnosis not present

## 2012-10-18 LAB — CBC
MCHC: 33.7 g/dL (ref 30.0–36.0)
MCV: 89.3 fl (ref 78.0–100.0)

## 2012-10-18 LAB — BASIC METABOLIC PANEL
GFR: 68.12 mL/min (ref >60.00)
Potassium: 3.6 mEq/L (ref 3.5–5.1)
Sodium: 140 mEq/L (ref 135–145)

## 2012-10-19 NOTE — Progress Notes (Signed)
Pt was started on Eliquis for Afib on 08/2012.    Reviewed patients medication list.  Pt is not  currently on any combined P-gp and strong CYP3A4 inhibitors/inducers (ketoconazole, traconazole, ritonavir, carbamazepine, phenytoin, rifampin, St. John's wort).  Reviewed labs.  SCr 0.8, Weight 70 Kg, CrCl- 49.  Dose  Appropriate based on Scr and weight.   Hgb and HCT 11.8/35.1.  A full discussion of the nature of anticoagulants has been carried out.  A benefit/risk analysis has been presented to the patient, so that they understand the justification for choosing anticoagulation with Eliquis at this time.  The need for compliance is stressed.  Pt is aware to take the medication twice daily.  Side effects of potential bleeding are discussed, including unusual colored urine or stools, coughing up blood or coffee ground emesis, nose bleeds or serious fall or head trauma.  Discussed signs and symptoms of stroke. The patient should avoid any OTC items containing aspirin or ibuprofen.  Avoid alcohol consumption.   Call if any signs of abnormal bleeding.  Discussed financial obligations and resolved any difficulty in obtaining medication.  Next lab test test in 6 months.

## 2012-11-03 DIAGNOSIS — F329 Major depressive disorder, single episode, unspecified: Secondary | ICD-10-CM | POA: Diagnosis not present

## 2012-11-03 DIAGNOSIS — I10 Essential (primary) hypertension: Secondary | ICD-10-CM | POA: Diagnosis not present

## 2012-11-03 DIAGNOSIS — E782 Mixed hyperlipidemia: Secondary | ICD-10-CM | POA: Diagnosis not present

## 2012-11-03 DIAGNOSIS — E119 Type 2 diabetes mellitus without complications: Secondary | ICD-10-CM | POA: Diagnosis not present

## 2012-11-03 DIAGNOSIS — E039 Hypothyroidism, unspecified: Secondary | ICD-10-CM | POA: Diagnosis not present

## 2012-11-10 ENCOUNTER — Other Ambulatory Visit: Payer: Self-pay

## 2012-11-10 DIAGNOSIS — I2510 Atherosclerotic heart disease of native coronary artery without angina pectoris: Secondary | ICD-10-CM

## 2012-11-10 DIAGNOSIS — I4891 Unspecified atrial fibrillation: Secondary | ICD-10-CM

## 2012-11-10 MED ORDER — APIXABAN 5 MG PO TABS: 5.0000 mg | ORAL_TABLET | Freq: Two times a day (BID) | ORAL | Status: DC

## 2012-11-10 MED ORDER — OLMESARTAN MEDOXOMIL 40 MG PO TABS: 40.0000 mg | ORAL_TABLET | Freq: Every day | ORAL | Status: DC

## 2012-11-10 MED ORDER — FUROSEMIDE 40 MG PO TABS: 40.0000 mg | ORAL_TABLET | Freq: Every day | ORAL | Status: AC

## 2012-11-10 MED ORDER — SIMVASTATIN 40 MG PO TABS: 40.0000 mg | ORAL_TABLET | Freq: Every day | ORAL | Status: AC

## 2012-11-10 MED ORDER — NITROGLYCERIN 0.6 MG SL SUBL: 0.6000 mg | SUBLINGUAL_TABLET | SUBLINGUAL | Status: DC | PRN

## 2012-11-10 MED ORDER — LEVOTHYROXINE SODIUM 112 MCG PO TABS: 75.0000 ug | ORAL_TABLET | Freq: Every day | ORAL | Status: DC

## 2012-11-11 ENCOUNTER — Telehealth: Payer: Self-pay

## 2012-11-11 NOTE — Telephone Encounter (Signed)
Received fax request from [facility] Yvonna Alanis requesting written prescription for eliquis.Prescription faxed to her at fax # [phone].

## 2012-11-24 DIAGNOSIS — T1510XA Foreign body in conjunctival sac, unspecified eye, initial encounter: Secondary | ICD-10-CM | POA: Diagnosis not present

## 2012-11-24 DIAGNOSIS — E119 Type 2 diabetes mellitus without complications: Secondary | ICD-10-CM | POA: Diagnosis not present

## 2012-11-26 ENCOUNTER — Encounter: Payer: Self-pay | Admitting: Cardiology

## 2013-01-26 DIAGNOSIS — E039 Hypothyroidism, unspecified: Secondary | ICD-10-CM | POA: Diagnosis not present

## 2013-01-26 DIAGNOSIS — I10 Essential (primary) hypertension: Secondary | ICD-10-CM | POA: Diagnosis not present

## 2013-01-26 DIAGNOSIS — E785 Hyperlipidemia, unspecified: Secondary | ICD-10-CM | POA: Diagnosis not present

## 2013-01-26 DIAGNOSIS — E119 Type 2 diabetes mellitus without complications: Secondary | ICD-10-CM | POA: Diagnosis not present

## 2013-02-11 DIAGNOSIS — Z23 Encounter for immunization: Secondary | ICD-10-CM | POA: Diagnosis not present

## 2013-02-16 ENCOUNTER — Encounter: Payer: Self-pay | Admitting: Internal Medicine

## 2013-02-16 ENCOUNTER — Ambulatory Visit (INDEPENDENT_AMBULATORY_CARE_PROVIDER_SITE_OTHER): Payer: Medicare Other | Admitting: *Deleted

## 2013-02-16 DIAGNOSIS — I4891 Unspecified atrial fibrillation: Secondary | ICD-10-CM | POA: Diagnosis not present

## 2013-02-16 DIAGNOSIS — I442 Atrioventricular block, complete: Secondary | ICD-10-CM | POA: Diagnosis not present

## 2013-02-16 LAB — PACEMAKER DEVICE OBSERVATION
BRDY-0002RV: 60 {beats}/min
BRDY-0003RV: 130 {beats}/min
RV LEAD AMPLITUDE: 15.67 mv
RV LEAD THRESHOLD: 1 V

## 2013-02-16 NOTE — Progress Notes (Signed)
Pacemaker check in clinic. Normal device function. Thresholds, sensing, impedances consistent with previous measurements. Device programmed to maximize longevity.  A-fib,  + eliquis.   No high ventricular rates noted. Device programmed at appropriate safety margins. Histogram distribution appropriate for patient activity level. Device programmed to optimize intrinsic conduction. Estimated longevity 5 years. Patient education completed.  ROV 6 months with Dr. Johney Frame.

## 2013-02-25 ENCOUNTER — Ambulatory Visit (INDEPENDENT_AMBULATORY_CARE_PROVIDER_SITE_OTHER): Payer: Medicare Other | Admitting: Cardiology

## 2013-02-25 ENCOUNTER — Encounter: Payer: Self-pay | Admitting: Cardiology

## 2013-02-25 ENCOUNTER — Telehealth: Payer: Self-pay | Admitting: Cardiology

## 2013-02-25 VITALS — BP 150/70 | HR 68 | Ht 62.0 in | Wt 151.0 lb

## 2013-02-25 DIAGNOSIS — R609 Edema, unspecified: Secondary | ICD-10-CM | POA: Diagnosis not present

## 2013-02-25 DIAGNOSIS — I442 Atrioventricular block, complete: Secondary | ICD-10-CM

## 2013-02-25 DIAGNOSIS — I2510 Atherosclerotic heart disease of native coronary artery without angina pectoris: Secondary | ICD-10-CM

## 2013-02-25 DIAGNOSIS — I10 Essential (primary) hypertension: Secondary | ICD-10-CM

## 2013-02-25 DIAGNOSIS — I4891 Unspecified atrial fibrillation: Secondary | ICD-10-CM

## 2013-02-25 MED ORDER — OLMESARTAN MEDOXOMIL 40 MG PO TABS: 40.0000 mg | ORAL_TABLET | Freq: Every day | ORAL | Status: DC

## 2013-02-25 MED ORDER — VALSARTAN 160 MG PO TABS: 160.0000 mg | ORAL_TABLET | Freq: Every day | ORAL | Status: DC

## 2013-02-25 NOTE — Patient Instructions (Signed)
Continue your current medication except we will switch Benicar to Valsartan 160 mg daily for cost.  I will see you in 6 months.

## 2013-02-25 NOTE — Telephone Encounter (Signed)
Returned call to patient's daughter Lucendia Herrlich no answer, left message on personal voice mail will cancel new prescription for valsartan due to costing more than benicar.Benicar refill sent to pharmacy.

## 2013-02-25 NOTE — Progress Notes (Signed)
[name] Date of Birth: [birth year]   History of Present Illness: [name] is seen for yearly followup today. She has a history of coronary disease and is status post stenting of the mid right coronary in February 2010 with a bare-metal stent. She had moderate three-vessel disease at that time. She also has a history of AV block with presyncope and has had previous pacemaker implant. She has a history of chronic atrial fibrillation managed with rate control and anticoagulation. She has been on Eliquis but stopped it this past week because it became too expensive. Her pulse went up from $40 a month to 130. She also wants a lower cost option to her Benicar. She denies any chest pain or shortness of breath. She's had no dizziness or syncope. She has no edema. She is still very active for her age.   Current Outpatient Prescriptions on File Prior to Visit  Medication Sig Dispense Refill  . apixaban (ELIQUIS) 5 MG TABS tablet Take 1 tablet (5 mg total) by mouth 2 (two) times daily.  90 tablet  3  . COD LIVER OIL PO Take by mouth.        . furosemide (LASIX) 40 MG tablet Take 1 tablet (40 mg total) by mouth daily.  90 tablet  3  . nitroGLYCERIN (NITROSTAT) 0.6 MG SL tablet Place 1 tablet (0.6 mg total) under the tongue every 5 (five) minutes as needed for chest pain.  25 tablet  11  . simvastatin (ZOCOR) 40 MG tablet Take 1 tablet (40 mg total) by mouth at bedtime.  90 tablet  3  . [DISCONTINUED] Probiotic Product (ALIGN PO) Take 1 capsule by mouth daily.         No current facility-administered medications on file prior to visit.    Allergies  Allergen Reactions  . Diclofenac Sodium   . Hytrin [Terazosin Hcl]   . Ibuprofen   . Norvasc [Amlodipine Besylate]   . Plavix [Clopidogrel Bisulfate]     Past Medical History  Diagnosis Date  . Colitis   . Colon cancer   . Diarrhea   . Hypothyroidism   . Ventral hernia   . Mobitz (type) II atrioventricular block     2nd degree  . CAD  (coronary artery disease)   . Syncopal episodes   . Diverticulosis of colon   . HLD (hyperlipidemia)   . DJD (degenerative joint disease)   . DM (diabetes mellitus)   . HTN (hypertension)     Past Surgical History  Procedure Laterality Date  . Appendectomy    . Hemicolectomy  10/07    right  . Acromionectomy with repair    . Pacemaker insertion    . Coronary angioplasty  07/03/2008  . Cardiac catheterization  08/22/1997  . Rotator cuff repair    . Transthoracic echocardiogram  06/29/2008    EF 55-60%    History  Smoking status  . Never Smoker   Smokeless tobacco  . Never Used    History  Alcohol Use No    Family History  Problem Relation Age of Onset  . Heart attack Father     Review of Systems:  as noted in history of present illness.  All other systems were reviewed and are negative.  Physical Exam: BP 150/70  Pulse 68  Ht 5\' 2"  (1.575 m)  Wt 151 lb (68.493 kg)  BMI 27.61 kg/m2 She is an elderly white female in no acute distress. She uses a cane to walk.  She is normocephalic, atraumatic. Pupils are equal round and reactive. Sclera clear. Oropharynx is clear. Neck is supple no JVD, adenopathy, thyromegaly, or bruits. Lungs are clear. Cardiac exam reveals a grade 1/6 systolic murmur in the right upper sternal border. Abdomen is obese, soft, nontender. She has a ventral hernia. There is no organomegaly. She has  trace  pretibial edema. Pedal pulses are good. Skin is warm and dry. She is alert and oriented x3. Cranial nerves II through XII are intact. LABORATORY DATA: ECG today demonstrates atrial fibrillation with ventricular paced rhythm. Pacemaker assessment on October 8 was satisfactory. No ventricular high rate episodes.   Assessment / Plan: 1. Coronary disease status post stenting of the mid RCA in February 2010 with a bare-metal stent. His moderate three-vessel disease. She is asymptomatic. We will continue with aspirin  only. Continue statin therapy.   2.  Complete heart block status post pacemaker implant. Followed regularly in our pacemaker clinic.  3. Hypertension, controlled. We will switch her Benicar to valsartan for cost.  4. Atrial fibrillation. I think because her medicine because she is in the donut hole. We will help her with samples today. I encouraged her to continue with her current therapy.

## 2013-02-25 NOTE — Telephone Encounter (Signed)
New message   Just saw Dr Lynwood Dawley new presc---it is not cheaper --want to go back to benicar.---[facility] at [facility]

## 2013-03-02 ENCOUNTER — Telehealth: Payer: Self-pay

## 2013-03-02 NOTE — Telephone Encounter (Signed)
Patient called eliquis 5 mg samples left at front desk 3rd floor.

## 2013-03-10 ENCOUNTER — Telehealth: Payer: Self-pay

## 2013-03-10 NOTE — Telephone Encounter (Signed)
Patient called samples of Eliquis 5 mg left at 3rd floor front desk.

## 2013-03-24 ENCOUNTER — Telehealth: Payer: Self-pay

## 2013-03-24 MED ORDER — LOSARTAN POTASSIUM 100 MG PO TABS: 100.0000 mg | ORAL_TABLET | Freq: Every day | ORAL | Status: DC

## 2013-03-24 NOTE — Telephone Encounter (Signed)
Patient called spoke to Dr.Jordan and told him valsartan cost too much and you started back on benicar. He advised to stop benicar and start cozaar 100 mg daily.Prescription sent to pharmacy.

## 2013-04-25 ENCOUNTER — Ambulatory Visit (INDEPENDENT_AMBULATORY_CARE_PROVIDER_SITE_OTHER): Payer: Medicare Other | Admitting: Pharmacist

## 2013-04-25 DIAGNOSIS — I4891 Unspecified atrial fibrillation: Secondary | ICD-10-CM

## 2013-04-25 LAB — CBC
MCV: 92.1 fl (ref 78.0–100.0)
Platelets: 249 10*3/uL (ref 150.0–400.0)
RBC: 3.7 Mil/uL — ABNORMAL LOW (ref 3.87–5.11)

## 2013-04-25 LAB — BASIC METABOLIC PANEL
CO2: 30 mEq/L (ref 19–32)
Calcium: 9.1 mg/dL (ref 8.4–10.5)
Sodium: 139 mEq/L (ref 135–145)

## 2013-04-25 MED ORDER — APIXABAN 5 MG PO TABS: 5.0000 mg | ORAL_TABLET | Freq: Two times a day (BID) | ORAL | Status: DC

## 2013-04-27 NOTE — Progress Notes (Signed)
Pt was started on Eliquis for Afib on 08/2012.    Reviewed patients medication list.  Pt is not  currently on any combined P-gp and strong CYP3A4 inhibitors/inducers (ketoconazole, traconazole, ritonavir, carbamazepine, phenytoin, rifampin, St. John's wort).  Reviewed labs.  SCr 0.7, Weight 68kg.  Dose appropriate based on Scr and weight.   Hgb and HCT stable at  11.3/34.1.  A full discussion of the nature of anticoagulants has been carried out.  A benefit/risk analysis has been presented to the patient, so that they understand the justification for choosing anticoagulation with Eliquis at this time.  The need for compliance is stressed.  Pt is aware to take the medication twice daily.  Side effects of potential bleeding are discussed, including unusual colored urine or stools, coughing up blood or coffee ground emesis, nose bleeds or serious fall or head trauma.  Discussed signs and symptoms of stroke. The patient should avoid any OTC items containing aspirin or ibuprofen.  Avoid alcohol consumption.   Call if any signs of abnormal bleeding.  Discussed financial obligations.  Pt did hit the donut hole in October and stopped the medication for a short time.  She has since been getting samples.  She was also on Benicar at that time and has since been changed to a generic ARB in hopes that she will not hit the donut hole next year.  She was given a free 30-day supply card to make it through the rest of this year.  Next lab test test in 6 months.

## 2013-05-25 DIAGNOSIS — E782 Mixed hyperlipidemia: Secondary | ICD-10-CM | POA: Diagnosis not present

## 2013-05-25 DIAGNOSIS — I10 Essential (primary) hypertension: Secondary | ICD-10-CM | POA: Diagnosis not present

## 2013-05-25 DIAGNOSIS — R209 Unspecified disturbances of skin sensation: Secondary | ICD-10-CM | POA: Diagnosis not present

## 2013-05-25 DIAGNOSIS — E119 Type 2 diabetes mellitus without complications: Secondary | ICD-10-CM | POA: Diagnosis not present

## 2013-05-31 DIAGNOSIS — H3530 Unspecified macular degeneration: Secondary | ICD-10-CM | POA: Diagnosis not present

## 2013-08-30 ENCOUNTER — Encounter: Payer: Self-pay | Admitting: *Deleted

## 2013-08-31 ENCOUNTER — Encounter: Payer: Self-pay | Admitting: Internal Medicine

## 2013-08-31 ENCOUNTER — Ambulatory Visit (INDEPENDENT_AMBULATORY_CARE_PROVIDER_SITE_OTHER): Payer: Medicare Other | Admitting: Internal Medicine

## 2013-08-31 ENCOUNTER — Encounter: Payer: Self-pay | Admitting: Cardiology

## 2013-08-31 ENCOUNTER — Ambulatory Visit (INDEPENDENT_AMBULATORY_CARE_PROVIDER_SITE_OTHER): Payer: Medicare Other | Admitting: Cardiology

## 2013-08-31 VITALS — BP 138/78 | HR 90 | Ht 62.0 in | Wt 156.0 lb

## 2013-08-31 VITALS — BP 138/78 | HR 90 | Ht 62.0 in | Wt 156.1 lb

## 2013-08-31 DIAGNOSIS — I4891 Unspecified atrial fibrillation: Secondary | ICD-10-CM | POA: Diagnosis not present

## 2013-08-31 DIAGNOSIS — I2510 Atherosclerotic heart disease of native coronary artery without angina pectoris: Secondary | ICD-10-CM | POA: Diagnosis not present

## 2013-08-31 DIAGNOSIS — I442 Atrioventricular block, complete: Secondary | ICD-10-CM

## 2013-08-31 DIAGNOSIS — Z950 Presence of cardiac pacemaker: Secondary | ICD-10-CM

## 2013-08-31 DIAGNOSIS — I10 Essential (primary) hypertension: Secondary | ICD-10-CM

## 2013-08-31 LAB — MDC_IDC_ENUM_SESS_TYPE_INCLINIC
Battery Voltage: 2.78 V
Brady Statistic RV Percent Paced: 99 %
Lead Channel Impedance Value: 429 Ohm
Lead Channel Pacing Threshold Pulse Width: 0.4 ms
Lead Channel Setting Pacing Amplitude: 2 V
Lead Channel Setting Sensing Sensitivity: 4 mV
MDC IDC MSMT LEADCHNL RA SENSING INTR AMPL: 1 mV
MDC IDC MSMT LEADCHNL RV IMPEDANCE VALUE: 486 Ohm
MDC IDC MSMT LEADCHNL RV PACING THRESHOLD AMPLITUDE: 1.25 V
MDC IDC SET LEADCHNL RV PACING AMPLITUDE: 2.5 V
MDC IDC SET LEADCHNL RV PACING PULSEWIDTH: 0.4 ms

## 2013-08-31 MED ORDER — NITROGLYCERIN 0.6 MG SL SUBL: 0.6000 mg | SUBLINGUAL_TABLET | SUBLINGUAL | Status: DC | PRN

## 2013-08-31 NOTE — Progress Notes (Signed)
[name] Date of Birth: 09-Feb-1917   History of Present Illness: Mrs. Bultman is seen for yearly followup today. She is now 80 years old. She has a history of coronary disease and is status post stenting of the mid right coronary in February 2010 with a bare-metal stent. She had moderate three-vessel disease at that time. She also has a history of AV block with presyncope and has had previous pacemaker implant. She has a history of chronic atrial fibrillation managed with rate control and anticoagulation. She has been on Eliquis. She denies any chest pain or shortness of breath. She's had no dizziness or syncope. She has no edema. She is still very active for her age but does use a cane when walking.  Current Outpatient Prescriptions on File Prior to Visit  Medication Sig Dispense Refill  . apixaban (ELIQUIS) 5 MG TABS tablet Take 1 tablet (5 mg total) by mouth 2 (two) times daily.  60 tablet  0  . COD LIVER OIL PO Take 1 capsule by mouth daily.       . furosemide (LASIX) 40 MG tablet Take 1 tablet (40 mg total) by mouth daily.  90 tablet  3  . levothyroxine (SYNTHROID, LEVOTHROID) 75 MCG tablet Take 75 mcg by mouth daily before breakfast.      . losartan (COZAAR) 100 MG tablet Take 1 tablet (100 mg total) by mouth daily.  30 tablet  6  . simvastatin (ZOCOR) 40 MG tablet Take 1 tablet (40 mg total) by mouth at bedtime.  90 tablet  3  . [DISCONTINUED] Probiotic Product (ALIGN PO) Take 1 capsule by mouth daily.         No current facility-administered medications on file prior to visit.    Allergies  Allergen Reactions  . Diclofenac Sodium   . Hytrin [Terazosin Hcl]   . Ibuprofen   . Norvasc [Amlodipine Besylate]   . Plavix [Clopidogrel Bisulfate]     Past Medical History  Diagnosis Date  . Colitis   . Colon cancer   . Hypothyroidism   . Ventral hernia   . Mobitz (type) II atrioventricular block   . CAD (coronary artery disease)   . Diverticulosis of colon   . HLD  (hyperlipidemia)   . DJD (degenerative joint disease)   . DM (diabetes mellitus)   . HTN (hypertension)   . Atrial fibrillation     Past Surgical History  Procedure Laterality Date  . Appendectomy    . Hemicolectomy  10/07    right  . Pacemaker insertion  07/06/08    MDT [id number] implanted for Mobitz II heart block by Dr Rayann Heman  . Coronary angioplasty  07/03/2008  . Cardiac catheterization  08/22/1997  . Rotator cuff repair    . Transthoracic echocardiogram  06/29/2008    EF 55-60%    History  Smoking status  . Never Smoker   Smokeless tobacco  . Never Used    History  Alcohol Use No    Family History  Problem Relation Age of Onset  . Heart attack Father     Review of Systems:  as noted in history of present illness.  All other systems were reviewed and are negative.  Physical Exam: BP 138/78  Pulse 90  Ht 5\' 2"  (1.575 m)  Wt 156 lb 1.9 oz (70.816 kg)  BMI 28.55 kg/m2 She is an elderly white female in no acute distress. She uses a cane to walk. She is normocephalic, atraumatic. Pupils are equal  round and reactive. Sclera clear. Oropharynx is clear. Neck is supple no JVD, adenopathy, thyromegaly, or bruits. Lungs are clear. Cardiac exam reveals a grade 1/6 systolic murmur in the right upper sternal border. Abdomen is obese, soft, nontender. She has a ventral hernia. There is no organomegaly. She has  trace  pretibial edema. Pedal pulses are good. Skin is warm and dry. She is alert and oriented x3. Cranial nerves II through XII are intact.  LABORATORY DATA:   Assessment / Plan: 1. Coronary disease status post stenting of the mid RCA in February 2010 with a bare-metal stent. Has moderate three-vessel disease. She is asymptomatic. Not on ASA due to Eliquis. Continue statin Rx.  2. Complete heart block status post pacemaker implant. Follow up in the pacer clinic today.  3. Hypertension, controlled. Continue losartan.  4. Atrial fibrillation. Rate controlled. On Eliquis.

## 2013-08-31 NOTE — Patient Instructions (Signed)
Continue your current therapy  I will see you in 6 months.   

## 2013-08-31 NOTE — Progress Notes (Signed)
PCP: [name] Stagers, MD Primary Cardiologist: [name] is a 80 y.o. female who presents today for routine electrophysiology followup.  Since last being seen in our clinic, the patient reports doing very well.  She is now in permanent atrial fibrillation, but asymptomatic.  She still lives independently.    Today, she denies symptoms of palpitations, chest pain, shortness of breath,  lower extremity edema, dizziness, presyncope, or syncope.  The patient is otherwise without complaint today.   Past Medical History  Diagnosis Date  . Colitis   . Colon cancer   . Hypothyroidism   . Ventral hernia   . Mobitz (type) II atrioventricular block   . CAD (coronary artery disease)   . Diverticulosis of colon   . HLD (hyperlipidemia)   . DJD (degenerative joint disease)   . DM (diabetes mellitus)   . HTN (hypertension)   . Atrial fibrillation    Past Surgical History  Procedure Laterality Date  . Appendectomy    . Hemicolectomy  10/07    right  . Pacemaker insertion  07/06/08    MDT [id number] implanted for Mobitz II heart block by Dr [name]  . Coronary angioplasty  07/03/2008  . Cardiac catheterization  08/22/1997  . Rotator cuff repair    . Transthoracic echocardiogram  06/29/2008    EF 55-60%    Current Outpatient Prescriptions  Medication Sig Dispense Refill  . apixaban (ELIQUIS) 5 MG TABS tablet Take 1 tablet (5 mg total) by mouth 2 (two) times daily.  60 tablet  0  . COD LIVER OIL PO Take 1 capsule by mouth daily.       . furosemide (LASIX) 40 MG tablet Take 1 tablet (40 mg total) by mouth daily.  90 tablet  3  . levothyroxine (SYNTHROID, LEVOTHROID) 75 MCG tablet Take 75 mcg by mouth daily before breakfast.      . losartan (COZAAR) 100 MG tablet Take 1 tablet (100 mg total) by mouth daily.  30 tablet  6  . nitroGLYCERIN (NITROSTAT) 0.6 MG SL tablet Place 1 tablet (0.6 mg total) under the tongue every 5 (five) minutes as needed for chest pain.  25 tablet  11   . simvastatin (ZOCOR) 40 MG tablet Take 1 tablet (40 mg total) by mouth at bedtime.  90 tablet  3  . [DISCONTINUED] Probiotic Product (ALIGN PO) Take 1 capsule by mouth daily.         No current facility-administered medications for this visit.    Physical Exam: Filed Vitals:   08/31/13 1546  BP: 138/78  Pulse: 90  Height: 5\' 2"  (1.575 m)  Weight: 156 lb (70.761 kg)    GEN- The patient is well appearing, alert and oriented x 3 today.   Head- normocephalic, atraumatic Eyes-  Sclera clear, conjunctiva pink Ears- hearing intact Oropharynx- clear Lungs- Clear to ausculation bilaterally, normal work of breathing Chest- pacemaker pocket is well healed Heart- Regular rate and rhythm (paced) GI- soft, NT, ND, + BS Extremities- no clubbing, cyanosis, or edema  Pacemaker interrogation- reviewed in detail today,  See PACEART report  Assessment and Plan:  1. Complete heart block Normal pacemaker function See Pace Art report No changes today  2.  Atrial fibrillation Device reprogrammed to VVIR today. Continue Eliquis  Follow-up with Dr [name] as scheduled Return to the device clinic to see [name] in 1year

## 2013-08-31 NOTE — Patient Instructions (Addendum)
Your physician wants you to follow-up in: 6 months with device clinic (same time as Dr Martinique).  You will receive a reminder letter in the mail two months in advance. If you don't receive a letter, please call our office to schedule the follow-up appointment.   Your physician wants you to follow-up in: 12 months with Andi Hence You will receive a reminder letter in the mail two months in advance. If you don't receive a letter, please call our office to schedule the follow-up appointment.

## 2013-09-22 DIAGNOSIS — R413 Other amnesia: Secondary | ICD-10-CM | POA: Diagnosis not present

## 2013-09-22 DIAGNOSIS — I10 Essential (primary) hypertension: Secondary | ICD-10-CM | POA: Diagnosis not present

## 2013-09-22 DIAGNOSIS — E119 Type 2 diabetes mellitus without complications: Secondary | ICD-10-CM | POA: Diagnosis not present

## 2013-09-22 DIAGNOSIS — E039 Hypothyroidism, unspecified: Secondary | ICD-10-CM | POA: Diagnosis not present

## 2013-09-22 DIAGNOSIS — E782 Mixed hyperlipidemia: Secondary | ICD-10-CM | POA: Diagnosis not present

## 2013-10-26 ENCOUNTER — Other Ambulatory Visit: Payer: Self-pay | Admitting: Cardiology

## 2013-10-26 DIAGNOSIS — R609 Edema, unspecified: Secondary | ICD-10-CM | POA: Diagnosis not present

## 2013-10-28 ENCOUNTER — Other Ambulatory Visit: Payer: Self-pay | Admitting: Cardiology

## 2013-11-04 DIAGNOSIS — R609 Edema, unspecified: Secondary | ICD-10-CM | POA: Diagnosis not present

## 2013-11-04 DIAGNOSIS — E119 Type 2 diabetes mellitus without complications: Secondary | ICD-10-CM | POA: Diagnosis not present

## 2013-11-22 DIAGNOSIS — H3530 Unspecified macular degeneration: Secondary | ICD-10-CM | POA: Diagnosis not present

## 2013-12-06 ENCOUNTER — Telehealth: Payer: Self-pay | Admitting: Cardiology

## 2013-12-06 ENCOUNTER — Other Ambulatory Visit: Payer: Self-pay

## 2013-12-06 NOTE — Telephone Encounter (Signed)
Received call from patient she stated she was calling to scheduled f/u appointment with Dr.Jordan.Appointment scheduled with Dr.Jordan 02/09/14 at 11:30 am.

## 2013-12-31 ENCOUNTER — Encounter: Payer: Self-pay | Admitting: Gastroenterology

## 2014-01-24 DIAGNOSIS — Z23 Encounter for immunization: Secondary | ICD-10-CM | POA: Diagnosis not present

## 2014-01-24 DIAGNOSIS — Z1331 Encounter for screening for depression: Secondary | ICD-10-CM | POA: Diagnosis not present

## 2014-01-24 DIAGNOSIS — R609 Edema, unspecified: Secondary | ICD-10-CM | POA: Diagnosis not present

## 2014-01-24 DIAGNOSIS — I2510 Atherosclerotic heart disease of native coronary artery without angina pectoris: Secondary | ICD-10-CM | POA: Diagnosis not present

## 2014-01-24 DIAGNOSIS — E119 Type 2 diabetes mellitus without complications: Secondary | ICD-10-CM | POA: Diagnosis not present

## 2014-01-24 DIAGNOSIS — E039 Hypothyroidism, unspecified: Secondary | ICD-10-CM | POA: Diagnosis not present

## 2014-01-24 DIAGNOSIS — E782 Mixed hyperlipidemia: Secondary | ICD-10-CM | POA: Diagnosis not present

## 2014-01-24 DIAGNOSIS — Z0000 Encounter for general adult medical examination without abnormal findings: Secondary | ICD-10-CM | POA: Diagnosis not present

## 2014-01-24 DIAGNOSIS — I10 Essential (primary) hypertension: Secondary | ICD-10-CM | POA: Diagnosis not present

## 2014-02-09 ENCOUNTER — Encounter: Payer: Self-pay | Admitting: Cardiology

## 2014-02-09 ENCOUNTER — Ambulatory Visit (INDEPENDENT_AMBULATORY_CARE_PROVIDER_SITE_OTHER): Payer: Medicare Other | Admitting: Cardiology

## 2014-02-09 VITALS — BP 132/76 | HR 89 | Ht 62.0 in | Wt 160.0 lb

## 2014-02-09 DIAGNOSIS — I442 Atrioventricular block, complete: Secondary | ICD-10-CM | POA: Diagnosis not present

## 2014-02-09 DIAGNOSIS — Z950 Presence of cardiac pacemaker: Secondary | ICD-10-CM

## 2014-02-09 DIAGNOSIS — I2510 Atherosclerotic heart disease of native coronary artery without angina pectoris: Secondary | ICD-10-CM | POA: Diagnosis not present

## 2014-02-09 DIAGNOSIS — I482 Chronic atrial fibrillation, unspecified: Secondary | ICD-10-CM

## 2014-02-09 DIAGNOSIS — I10 Essential (primary) hypertension: Secondary | ICD-10-CM

## 2014-02-09 NOTE — Progress Notes (Signed)
[name] Date of Birth: 1917-01-14   History of Present Illness: [name] is seen for followup today. She is  80 years old. She has a history of coronary disease and is status post stenting of the mid right coronary in February 2010 with a bare-metal stent. She had moderate three-vessel disease at that time. She also has a history of AV block with presyncope and has had previous pacemaker implant. She has a history of chronic atrial fibrillation managed with rate control and anticoagulation. She has been on Eliquis. She denies any chest pain or shortness of breath. She's had no dizziness or syncope. She has some ankle edema. Her primary care recommended increasing her lasix but she never did. Tries to keep her feet elevated and watches her salt for the most part.   Current Outpatient Prescriptions on File Prior to Visit  Medication Sig Dispense Refill  . COD LIVER OIL PO Take 1 capsule by mouth daily.       Marland Kitchen ELIQUIS 5 MG TABS tablet TAKE ONE TABLET BY MOUTH TWICE DAILY  60 tablet  3  . furosemide (LASIX) 40 MG tablet Take 1 tablet (40 mg total) by mouth daily.  90 tablet  3  . levothyroxine (SYNTHROID, LEVOTHROID) 75 MCG tablet Take 75 mcg by mouth daily before breakfast.      . losartan (COZAAR) 100 MG tablet TAKE ONE TABLET BY MOUTH ONCE DAILY  30 tablet  3  . nitroGLYCERIN (NITROSTAT) 0.6 MG SL tablet Place 1 tablet (0.6 mg total) under the tongue every 5 (five) minutes as needed for chest pain.  25 tablet  11  . simvastatin (ZOCOR) 40 MG tablet Take 1 tablet (40 mg total) by mouth at bedtime.  90 tablet  3  . [DISCONTINUED] Probiotic Product (ALIGN PO) Take 1 capsule by mouth daily.         No current facility-administered medications on file prior to visit.    Allergies  Allergen Reactions  . Diclofenac Sodium   . Hytrin [Terazosin Hcl]   . Ibuprofen   . Norvasc [Amlodipine Besylate]   . Plavix [Clopidogrel Bisulfate]     Past Medical History  Diagnosis Date  .  Colitis   . Colon cancer   . Hypothyroidism   . Ventral hernia   . Mobitz (type) II atrioventricular block   . CAD (coronary artery disease)   . Diverticulosis of colon   . HLD (hyperlipidemia)   . DJD (degenerative joint disease)   . DM (diabetes mellitus)   . HTN (hypertension)   . Atrial fibrillation     Past Surgical History  Procedure Laterality Date  . Appendectomy    . Hemicolectomy  10/07    right  . Pacemaker insertion  07/06/08    MDT [id number] implanted for Mobitz II heart block by Dr Rayann Heman  . Coronary angioplasty  07/03/2008  . Cardiac catheterization  08/22/1997  . Rotator cuff repair    . Transthoracic echocardiogram  06/29/2008    EF 55-60%    History  Smoking status  . Never Smoker   Smokeless tobacco  . Never Used    History  Alcohol Use No    Family History  Problem Relation Age of Onset  . Heart attack Father     Review of Systems:  as noted in history of present illness.  All other systems were reviewed and are negative.  Physical Exam: BP 132/76  Pulse 89  Ht 5\' 2"  (1.575 m)  Wt 160 lb (72.576 kg)  BMI 29.26 kg/m2 She is an elderly white female in no acute distress. She uses a cane to walk. She is normocephalic, atraumatic. Pupils are equal round and reactive. Sclera clear. Oropharynx is clear. Neck is supple no JVD, adenopathy, thyromegaly, or bruits. Lungs are clear. Cardiac exam reveals a grade 1/6 systolic murmur in the right upper sternal border. Abdomen is obese, soft, nontender. She has a ventral hernia. There is no organomegaly. She has  trace  pretibial edema. Pedal pulses are good. Skin is warm and dry. She is alert and oriented x3. Cranial nerves II through XII are intact.  LABORATORY DATA: Ecg: V paced rate 89 bpm  Assessment / Plan: 1. Coronary disease status post stenting of the mid RCA in February 2010 with a bare-metal stent. Has moderate three-vessel disease. She is asymptomatic. Not on ASA due to Eliquis. Continue statin  Rx.  2. Complete heart block status post pacemaker implant. Now programmed to VVI mode due to chronic Afib.  3. Hypertension, controlled. Continue losartan.  4. Atrial fibrillation. Rate controlled. On Eliquis.   5. Edema. Mild on exam continue lasix 40 mg daily.

## 2014-02-09 NOTE — Patient Instructions (Signed)
Continue your current therapy  Avoid salt  I will see you in 6 months

## 2014-02-09 NOTE — Addendum Note (Signed)
Addended by: Golden Hurter D on: 02/09/2014 01:35 PM   Modules accepted: Orders

## 2014-02-27 ENCOUNTER — Ambulatory Visit (INDEPENDENT_AMBULATORY_CARE_PROVIDER_SITE_OTHER): Payer: Medicare Other | Admitting: *Deleted

## 2014-02-27 DIAGNOSIS — I482 Chronic atrial fibrillation, unspecified: Secondary | ICD-10-CM

## 2014-02-27 LAB — MDC_IDC_ENUM_SESS_TYPE_INCLINIC
Lead Channel Impedance Value: 538 Ohm
Lead Channel Impedance Value: 67 Ohm
Lead Channel Pacing Threshold Amplitude: 1 V
Lead Channel Sensing Intrinsic Amplitude: 15.67 mV
Lead Channel Setting Pacing Amplitude: 2.5 V
MDC IDC MSMT BATTERY IMPEDANCE: 1133 Ohm
MDC IDC MSMT BATTERY REMAINING LONGEVITY: 52 mo
MDC IDC MSMT BATTERY VOLTAGE: 2.77 V
MDC IDC MSMT LEADCHNL RV PACING THRESHOLD PULSEWIDTH: 0.4 ms
MDC IDC SESS DTM: 20151019090901
MDC IDC SET LEADCHNL RV PACING PULSEWIDTH: 0.4 ms
MDC IDC SET LEADCHNL RV SENSING SENSITIVITY: 4 mV
MDC IDC STAT BRADY RV PERCENT PACED: 99 %

## 2014-02-27 NOTE — Progress Notes (Signed)
Pacemaker check in clinic. Normal device function. Threshold, sensing, and impedance consistent with previous measurements. Device programmed to maximize longevity. Permanent AF + Eliquis. No high ventricular rates noted. Device programmed at appropriate safety margins. Histogram distribution appropriate for patient activity level. Device programmed to optimize intrinsic conduction. Estimated longevity 4.5 years. Plan to follow up with JA in 6 months.

## 2014-03-03 ENCOUNTER — Other Ambulatory Visit: Payer: Self-pay

## 2014-03-03 MED ORDER — LOSARTAN POTASSIUM 100 MG PO TABS: ORAL_TABLET | ORAL | Status: DC

## 2014-03-03 MED ORDER — APIXABAN 5 MG PO TABS: ORAL_TABLET | ORAL | Status: DC

## 2014-03-16 ENCOUNTER — Encounter: Payer: Self-pay | Admitting: Internal Medicine

## 2014-07-07 ENCOUNTER — Other Ambulatory Visit: Payer: Self-pay

## 2014-07-07 MED ORDER — LOSARTAN POTASSIUM 100 MG PO TABS: ORAL_TABLET | ORAL | Status: DC

## 2014-07-11 ENCOUNTER — Other Ambulatory Visit: Payer: Self-pay | Admitting: *Deleted

## 2014-07-11 MED ORDER — APIXABAN 5 MG PO TABS: ORAL_TABLET | ORAL | Status: DC

## 2014-07-25 DIAGNOSIS — E039 Hypothyroidism, unspecified: Secondary | ICD-10-CM | POA: Diagnosis not present

## 2014-07-25 DIAGNOSIS — I10 Essential (primary) hypertension: Secondary | ICD-10-CM | POA: Diagnosis not present

## 2014-07-25 DIAGNOSIS — E785 Hyperlipidemia, unspecified: Secondary | ICD-10-CM | POA: Diagnosis not present

## 2014-07-25 DIAGNOSIS — E119 Type 2 diabetes mellitus without complications: Secondary | ICD-10-CM | POA: Diagnosis not present

## 2014-08-04 ENCOUNTER — Ambulatory Visit (INDEPENDENT_AMBULATORY_CARE_PROVIDER_SITE_OTHER): Payer: Medicare Other | Admitting: Cardiology

## 2014-08-04 ENCOUNTER — Encounter: Payer: Self-pay | Admitting: Cardiology

## 2014-08-04 VITALS — BP 124/60 | HR 82 | Ht 62.0 in | Wt 155.4 lb

## 2014-08-04 DIAGNOSIS — I2510 Atherosclerotic heart disease of native coronary artery without angina pectoris: Secondary | ICD-10-CM

## 2014-08-04 DIAGNOSIS — I10 Essential (primary) hypertension: Secondary | ICD-10-CM | POA: Diagnosis not present

## 2014-08-04 DIAGNOSIS — I482 Chronic atrial fibrillation, unspecified: Secondary | ICD-10-CM

## 2014-08-04 DIAGNOSIS — I442 Atrioventricular block, complete: Secondary | ICD-10-CM

## 2014-08-04 NOTE — Progress Notes (Signed)
[name] Date of Birth: 01/27/1917   History of Present Illness: [name] is seen for followup CAD and Afib. She is  81 years old. She has a history of coronary disease and is status post stenting of the mid right coronary in February 2010 with a bare-metal stent. She had moderate three-vessel disease at that time. She also has a history of AV block with presyncope and has had previous pacemaker implant. She has a history of chronic atrial fibrillation managed with rate control and anticoagulation. She has been on Eliquis. She denies any chest pain or shortness of breath. She's had no dizziness or syncope. Her ankle edema has improved. Her major complaint is of hearing loss. She walks with a cane. She does note some memory loss.  Current Outpatient Prescriptions on File Prior to Visit  Medication Sig Dispense Refill  . apixaban (ELIQUIS) 5 MG TABS tablet TAKE ONE TABLET BY MOUTH TWICE DAILY 60 tablet 5  . furosemide (LASIX) 40 MG tablet Take 1 tablet (40 mg total) by mouth daily. 90 tablet 3  . levothyroxine (SYNTHROID, LEVOTHROID) 75 MCG tablet Take 75 mcg by mouth daily before breakfast.    . losartan (COZAAR) 100 MG tablet TAKE ONE TABLET BY MOUTH ONCE DAILY 30 tablet 6  . nitroGLYCERIN (NITROSTAT) 0.6 MG SL tablet Place 1 tablet (0.6 mg total) under the tongue every 5 (five) minutes as needed for chest pain. 25 tablet 11  . simvastatin (ZOCOR) 40 MG tablet Take 1 tablet (40 mg total) by mouth at bedtime. 90 tablet 3  . COD LIVER OIL PO Take 1 capsule by mouth daily.     . [DISCONTINUED] Probiotic Product (ALIGN PO) Take 1 capsule by mouth daily.       No current facility-administered medications on file prior to visit.    Allergies  Allergen Reactions  . Diclofenac Sodium   . Hytrin [Terazosin Hcl]   . Ibuprofen   . Norvasc [Amlodipine Besylate]   . Plavix [Clopidogrel Bisulfate]     Past Medical History  Diagnosis Date  . Colitis   . Colon cancer   .  Hypothyroidism   . Ventral hernia   . Mobitz (type) II atrioventricular block   . CAD (coronary artery disease)   . Diverticulosis of colon   . HLD (hyperlipidemia)   . DJD (degenerative joint disease)   . DM (diabetes mellitus)   . HTN (hypertension)   . Atrial fibrillation     Past Surgical History  Procedure Laterality Date  . Appendectomy    . Hemicolectomy  10/07    right  . Pacemaker insertion  07/06/08    MDT [id number] implanted for Mobitz II heart block by Dr Rayann Heman  . Coronary angioplasty  07/03/2008  . Cardiac catheterization  08/22/1997  . Rotator cuff repair    . Transthoracic echocardiogram  06/29/2008    EF 55-60%    History  Smoking status  . Never Smoker   Smokeless tobacco  . Never Used    History  Alcohol Use No    Family History  Problem Relation Age of Onset  . Heart attack Father     Review of Systems:  as noted in history of present illness.  All other systems were reviewed and are negative.  Physical Exam: BP 124/60 mmHg  Pulse 82  Ht 5\' 2"  (1.575 m)  Wt 155 lb 7 oz (70.506 kg)  BMI 28.42 kg/m2 She is an elderly white female in no acute  distress. She uses a cane to walk. She is normocephalic, atraumatic. Pupils are equal round and reactive. Sclera clear. Oropharynx is clear. Neck is supple no JVD, adenopathy, thyromegaly, or bruits. Lungs are clear. Cardiac exam reveals a grade 1/6 systolic murmur in the right upper sternal border. Abdomen is obese, soft, nontender. She has a ventral hernia. There is no organomegaly. She has  trace  pretibial edema. Pedal pulses are good. Skin is warm and dry. She is alert and oriented x3. Cranial nerves II through XII are intact.  LABORATORY DATA:   Assessment / Plan: 1. Coronary disease status post stenting of the mid RCA in February 2010 with a bare-metal stent. Has moderate three-vessel disease. She is asymptomatic. Not on ASA due to Eliquis. Continue statin Rx.  2. Complete heart block status post  pacemaker implant. Now programmed to VVI mode due to chronic Afib. Pacer check in October was satisfactory. Follow up with Dr. Rayann Heman in May.  3. Hypertension, controlled. Continue losartan.  4. Atrial fibrillation. Rate controlled. On Eliquis.   5. Edema. Improved.  Request copy of lab work from primary care. I will follow up in 6 months.

## 2014-08-04 NOTE — Patient Instructions (Addendum)
Continue your current therapy  I will see you in 6 months.   

## 2014-08-08 ENCOUNTER — Encounter: Payer: Self-pay | Admitting: Cardiology

## 2014-09-11 ENCOUNTER — Encounter: Payer: BLUE CROSS/BLUE SHIELD | Admitting: Internal Medicine

## 2014-09-25 ENCOUNTER — Ambulatory Visit (INDEPENDENT_AMBULATORY_CARE_PROVIDER_SITE_OTHER): Payer: Medicare Other | Admitting: Internal Medicine

## 2014-09-25 ENCOUNTER — Encounter: Payer: Self-pay | Admitting: Internal Medicine

## 2014-09-25 VITALS — BP 138/62 | HR 77 | Ht 62.5 in | Wt 158.8 lb

## 2014-09-25 DIAGNOSIS — I2510 Atherosclerotic heart disease of native coronary artery without angina pectoris: Secondary | ICD-10-CM

## 2014-09-25 DIAGNOSIS — I4891 Unspecified atrial fibrillation: Secondary | ICD-10-CM

## 2014-09-25 DIAGNOSIS — I442 Atrioventricular block, complete: Secondary | ICD-10-CM | POA: Diagnosis not present

## 2014-09-25 DIAGNOSIS — R079 Chest pain, unspecified: Secondary | ICD-10-CM | POA: Diagnosis not present

## 2014-09-25 LAB — CUP PACEART INCLINIC DEVICE CHECK
Battery Remaining Longevity: 42 mo
Battery Voltage: 2.77 V
Lead Channel Impedance Value: 67 Ohm
Lead Channel Pacing Threshold Amplitude: 1.25 V
Lead Channel Pacing Threshold Pulse Width: 0.4 ms
Lead Channel Setting Pacing Amplitude: 2.75 V
Lead Channel Setting Pacing Pulse Width: 0.4 ms
Lead Channel Setting Sensing Sensitivity: 2.8 mV
MDC IDC MSMT BATTERY IMPEDANCE: 1431 Ohm
MDC IDC MSMT LEADCHNL RV IMPEDANCE VALUE: 526 Ohm
MDC IDC MSMT LEADCHNL RV SENSING INTR AMPL: 15.68 mV
MDC IDC SESS DTM: 20160516140718
MDC IDC STAT BRADY RV PERCENT PACED: 99 %

## 2014-09-25 MED ORDER — NITROGLYCERIN 0.6 MG SL SUBL: 0.6000 mg | SUBLINGUAL_TABLET | SUBLINGUAL | Status: DC | PRN

## 2014-09-25 NOTE — Patient Instructions (Addendum)
Medication Instructions:  Your physician recommends that you continue on your current medications as directed. Please refer to the Current Medication list given to you today.   Labwork: NONE  Testing/Procedures: NONE  Follow-Up: Remote monitoring is used to monitor your Pacemaker or ICD from home. This monitoring reduces the number of office visits required to check your device to one time per year. It allows [name] to keep an eye on the functioning of your device to ensure it is working properly. You are scheduled for a device check from home on 12/25/2014. You may send your transmission at any time that day. If you have a wireless device, the transmission will be sent automatically. After your physician reviews your transmission, you will receive a postcard with your next transmission date.   Your physician wants you to follow-up in: 12 months with Chanetta Marshall, NP. You will receive a reminder letter in the mail two months in advance. If you don't receive a letter, please call our office to schedule the follow-up appointment.   Any Other Special Instructions Will Be Listed Below (If Applicable).

## 2014-09-25 NOTE — Addendum Note (Signed)
Addended by: Newt Minion on: 09/25/2014 01:04 PM   Modules accepted: Orders

## 2014-09-25 NOTE — Progress Notes (Signed)
PCP: Shamleffer, Herschell Dimes, MD Primary Cardiologist: [place]  Daleigh [name] is a 81 y.o. female who presents today for routine electrophysiology followup.  Since last being seen in our clinic, the patient reports doing very well.  She is now in permanent atrial fibrillation, but asymptomatic.  She still lives independently and is doing well.  She has rare chest discomfort which is short lived.    Today, she denies symptoms of palpitations, shortness of breath,  lower extremity edema, dizziness, presyncope, or syncope.  The patient is otherwise without complaint today.   Past Medical History  Diagnosis Date  . Colitis   . Colon cancer   . Hypothyroidism   . Ventral hernia   . Mobitz (type) II atrioventricular block   . CAD (coronary artery disease)   . Diverticulosis of colon   . HLD (hyperlipidemia)   . DJD (degenerative joint disease)   . DM (diabetes mellitus)   . HTN (hypertension)   . Atrial fibrillation    Past Surgical History  Procedure Laterality Date  . Appendectomy    . Hemicolectomy  10/07    right  . Pacemaker insertion  07/06/08    MDT [id number] implanted for Mobitz II heart block by Dr Rayann Heman  . Coronary angioplasty  07/03/2008  . Cardiac catheterization  08/22/1997  . Rotator cuff repair    . Transthoracic echocardiogram  06/29/2008    EF 55-60%    Current Outpatient Prescriptions  Medication Sig Dispense Refill  . apixaban (ELIQUIS) 5 MG TABS tablet TAKE ONE TABLET BY MOUTH TWICE DAILY 60 tablet 5  . furosemide (LASIX) 40 MG tablet Take 1 tablet (40 mg total) by mouth daily. 90 tablet 3  . levothyroxine (SYNTHROID, LEVOTHROID) 75 MCG tablet Take 75 mcg by mouth daily before breakfast.    . losartan (COZAAR) 100 MG tablet TAKE ONE TABLET BY MOUTH ONCE DAILY 30 tablet 6  . nitroGLYCERIN (NITROSTAT) 0.6 MG SL tablet Place 1 tablet (0.6 mg total) under the tongue every 5 (five) minutes as needed for chest pain (MAX 3 TABLETS). 25 tablet 11  . simvastatin (ZOCOR)  40 MG tablet Take 1 tablet (40 mg total) by mouth at bedtime. 90 tablet 3  . [DISCONTINUED] Probiotic Product (ALIGN PO) Take 1 capsule by mouth daily.       No current facility-administered medications for this visit.    Physical Exam: Filed Vitals:   09/25/14 1214  BP: 138/62  Pulse: 77  Height: 5' 2.5" (1.588 m)  Weight: 158 lb 12.8 oz (72.031 kg)    GEN- The patient is well appearing, alert and oriented x 3 today.   Head- normocephalic, atraumatic Eyes-  Sclera clear, conjunctiva pink Ears- hearing intact Oropharynx- clear Lungs- Clear to ausculation bilaterally, normal work of breathing Chest- pacemaker pocket is well healed Heart- Regular rate and rhythm (paced) GI- soft, NT, ND, + BS Extremities- no clubbing, cyanosis, or edema  Pacemaker interrogation- reviewed in detail today,  See PACEART report  Assessment and Plan:  1. Complete heart block Normal pacemaker function See Pace Art report No changes today  2.  Atrial fibrillation Continue Eliquis  3. Code status She is very clear in her wishes to be DNI/DNR  Follow-up with Dr Martinique as scheduled Return to the device clinic to see Chanetta Marshall NP in 1year

## 2014-09-29 ENCOUNTER — Encounter: Payer: Self-pay | Admitting: Internal Medicine

## 2014-11-07 ENCOUNTER — Encounter: Payer: Self-pay | Admitting: Podiatry

## 2014-11-07 ENCOUNTER — Ambulatory Visit (INDEPENDENT_AMBULATORY_CARE_PROVIDER_SITE_OTHER): Payer: Medicare Other | Admitting: Podiatry

## 2014-11-07 VITALS — BP 124/62 | HR 76 | Temp 98.0°F | Resp 14

## 2014-11-07 DIAGNOSIS — E1142 Type 2 diabetes mellitus with diabetic polyneuropathy: Secondary | ICD-10-CM | POA: Diagnosis not present

## 2014-11-07 DIAGNOSIS — I2510 Atherosclerotic heart disease of native coronary artery without angina pectoris: Secondary | ICD-10-CM | POA: Diagnosis not present

## 2014-11-07 DIAGNOSIS — B351 Tinea unguium: Secondary | ICD-10-CM | POA: Diagnosis not present

## 2014-11-07 DIAGNOSIS — G629 Polyneuropathy, unspecified: Secondary | ICD-10-CM | POA: Diagnosis not present

## 2014-11-07 NOTE — Progress Notes (Signed)
   Subjective:    Patient ID: [name], female    DOB: Feb 26, 1917, 81 y.o.   MRN: [id number]  HPI  N- painful L-B/L toenails D-1 month O-gradual C-sore A- shoes T-tried to cut herself but cannot bend down to do it any longer and she is a diabetic  Patient presents here today with B/L toenails which are some painful and would like them trimmed.  Patient denies any history of foot ulceration or claudication   Review of Systems  HENT: Positive for hearing loss.        Objective:   Physical Exam Orientated 3 Patient niece present in treatment room today  Vascular: DP and PT pulses 1/4 bilaterally Capillary reflex immediate bilaterally No peripheral edema noted bilaterally  Neurological: Sensation to 10 g monofilament wire intact 4/5 bilaterally Vibratory sensation nonreactive bilaterally Ankle reflex equal and reactive bilaterally  Dermatological: The toenails are elongated brittle, discolored, hypertrophic 6-10  Musculoskeletal: Pes planus bilaterally Hammertoe deformities 2-5 bilaterally       Assessment & Plan:   Assessment: Satisfactory vascular status Diabetic peripheral neuropathy  onychomycoses 6-10  Plan: Debridement of toenails 10 without any bleeding  Reappoint at three-month intervals or at patient's request

## 2014-11-07 NOTE — Patient Instructions (Signed)
Diabetes and Foot Care Diabetes may cause you to have problems because of poor blood supply (circulation) to your feet and legs. This may cause the skin on your feet to become thinner, break easier, and heal more slowly. Your skin may become dry, and the skin may peel and crack. You may also have nerve damage in your legs and feet causing decreased feeling in them. You may not notice minor injuries to your feet that could lead to infections or more serious problems. Taking care of your feet is one of the most important things you can do for yourself.  HOME CARE INSTRUCTIONS  Wear shoes at all times, even in the house. Do not go barefoot. Bare feet are easily injured.  Check your feet daily for blisters, cuts, and redness. If you cannot see the bottom of your feet, use a mirror or ask someone for help.  Wash your feet with warm water (do not use hot water) and mild soap. Then pat your feet and the areas between your toes until they are completely dry. Do not soak your feet as this can dry your skin.  Apply a moisturizing lotion or petroleum jelly (that does not contain alcohol and is unscented) to the skin on your feet and to dry, brittle toenails. Do not apply lotion between your toes.  Trim your toenails straight across. Do not dig under them or around the cuticle. File the edges of your nails with an emery board or nail file.  Do not cut corns or calluses or try to remove them with medicine.  Wear clean socks or stockings every day. Make sure they are not too tight. Do not wear knee-high stockings since they may decrease blood flow to your legs.  Wear shoes that fit properly and have enough cushioning. To break in new shoes, wear them for just a few hours a day. This prevents you from injuring your feet. Always look in your shoes before you put them on to be sure there are no objects inside.  Do not cross your legs. This may decrease the blood flow to your feet.  If you find a minor scrape,  cut, or break in the skin on your feet, keep it and the skin around it clean and dry. These areas may be cleansed with mild soap and water. Do not cleanse the area with peroxide, alcohol, or iodine.  When you remove an adhesive bandage, be sure not to damage the skin around it.  If you have a wound, look at it several times a day to make sure it is healing.  Do not use heating pads or hot water bottles. They may burn your skin. If you have lost feeling in your feet or legs, you may not know it is happening until it is too late.  Make sure your health care provider performs a complete foot exam at least annually or more often if you have foot problems. Report any cuts, sores, or bruises to your health care provider immediately. SEEK MEDICAL CARE IF:   You have an injury that is not healing.  You have cuts or breaks in the skin.  You have an ingrown nail.  You notice redness on your legs or feet.  You feel burning or tingling in your legs or feet.  You have pain or cramps in your legs and feet.  Your legs or feet are numb.  Your feet always feel cold. SEEK IMMEDIATE MEDICAL CARE IF:   There is increasing redness,   swelling, or pain in or around a wound.  There is a red line that goes up your leg.  Pus is coming from a wound.  You develop a fever or as directed by your health care provider.  You notice a bad smell coming from an ulcer or wound. Document Released: 04/25/2000 Document Revised: 12/29/2012 Document Reviewed: 10/05/2012 Exit[employer] Patient Information 2015 [employer]. This information is not intended to replace advice given to you by your health care provider. Make sure you discuss any questions you have with your health care provider.  

## 2014-11-27 DIAGNOSIS — E119 Type 2 diabetes mellitus without complications: Secondary | ICD-10-CM | POA: Diagnosis not present

## 2014-12-25 ENCOUNTER — Ambulatory Visit (INDEPENDENT_AMBULATORY_CARE_PROVIDER_SITE_OTHER): Payer: Medicare Other | Admitting: *Deleted

## 2014-12-25 DIAGNOSIS — I442 Atrioventricular block, complete: Secondary | ICD-10-CM

## 2014-12-25 NOTE — Progress Notes (Signed)
Remote pacemaker transmission.   

## 2015-01-01 ENCOUNTER — Other Ambulatory Visit: Payer: Self-pay | Admitting: Cardiology

## 2015-01-02 LAB — CUP PACEART REMOTE DEVICE CHECK
Battery Impedance: 1659 Ohm
Battery Remaining Longevity: 39 mo
Brady Statistic RV Percent Paced: 99 %
Lead Channel Impedance Value: 67 Ohm
Lead Channel Setting Pacing Amplitude: 2.5 V
Lead Channel Setting Pacing Pulse Width: 0.4 ms
Lead Channel Setting Sensing Sensitivity: 2.8 mV
MDC IDC MSMT BATTERY VOLTAGE: 2.77 V
MDC IDC MSMT LEADCHNL RV IMPEDANCE VALUE: 603 Ohm
MDC IDC MSMT LEADCHNL RV PACING THRESHOLD AMPLITUDE: 1.25 V
MDC IDC MSMT LEADCHNL RV PACING THRESHOLD PULSEWIDTH: 0.4 ms
MDC IDC SESS DTM: 20160815112548

## 2015-01-09 DIAGNOSIS — R0789 Other chest pain: Secondary | ICD-10-CM | POA: Diagnosis not present

## 2015-01-12 ENCOUNTER — Encounter: Payer: Self-pay | Admitting: Cardiology

## 2015-01-24 ENCOUNTER — Encounter: Payer: Self-pay | Admitting: Internal Medicine

## 2015-01-30 DIAGNOSIS — Z0000 Encounter for general adult medical examination without abnormal findings: Secondary | ICD-10-CM | POA: Diagnosis not present

## 2015-01-30 DIAGNOSIS — E039 Hypothyroidism, unspecified: Secondary | ICD-10-CM | POA: Diagnosis not present

## 2015-01-30 DIAGNOSIS — I2510 Atherosclerotic heart disease of native coronary artery without angina pectoris: Secondary | ICD-10-CM | POA: Diagnosis not present

## 2015-01-30 DIAGNOSIS — I480 Paroxysmal atrial fibrillation: Secondary | ICD-10-CM | POA: Diagnosis not present

## 2015-01-30 DIAGNOSIS — E1165 Type 2 diabetes mellitus with hyperglycemia: Secondary | ICD-10-CM | POA: Diagnosis not present

## 2015-01-30 DIAGNOSIS — I10 Essential (primary) hypertension: Secondary | ICD-10-CM | POA: Diagnosis not present

## 2015-01-30 DIAGNOSIS — Z23 Encounter for immunization: Secondary | ICD-10-CM | POA: Diagnosis not present

## 2015-01-30 DIAGNOSIS — Z1389 Encounter for screening for other disorder: Secondary | ICD-10-CM | POA: Diagnosis not present

## 2015-02-26 ENCOUNTER — Other Ambulatory Visit: Payer: Self-pay | Admitting: Cardiology

## 2015-02-26 MED ORDER — APIXABAN 5 MG PO TABS: 5.0000 mg | ORAL_TABLET | Freq: Two times a day (BID) | ORAL | Status: DC

## 2015-03-28 ENCOUNTER — Encounter: Payer: Medicare Other | Admitting: *Deleted

## 2015-03-28 ENCOUNTER — Telehealth: Payer: Self-pay | Admitting: Cardiology

## 2015-03-28 NOTE — Telephone Encounter (Signed)
Spoke with pt and reminded pt of remote transmission that is due today. Pt verbalized understanding.   

## 2015-05-14 ENCOUNTER — Emergency Department (HOSPITAL_COMMUNITY): Payer: Medicare Other

## 2015-05-14 ENCOUNTER — Inpatient Hospital Stay (HOSPITAL_COMMUNITY)
Admission: EM | Admit: 2015-05-14 | Discharge: 2015-05-17 | DRG: 291 | Disposition: A | Discharge status: Home Health | Payer: Medicare Other | Attending: Internal Medicine | Admitting: Internal Medicine

## 2015-05-14 ENCOUNTER — Encounter (HOSPITAL_COMMUNITY): Payer: Self-pay

## 2015-05-14 ENCOUNTER — Inpatient Hospital Stay (HOSPITAL_COMMUNITY): Payer: Medicare Other

## 2015-05-14 DIAGNOSIS — Z66 Do not resuscitate: Secondary | ICD-10-CM | POA: Diagnosis present

## 2015-05-14 DIAGNOSIS — I5033 Acute on chronic diastolic (congestive) heart failure: Secondary | ICD-10-CM | POA: Diagnosis present

## 2015-05-14 DIAGNOSIS — I480 Paroxysmal atrial fibrillation: Secondary | ICD-10-CM | POA: Diagnosis not present

## 2015-05-14 DIAGNOSIS — J9600 Acute respiratory failure, unspecified whether with hypoxia or hypercapnia: Secondary | ICD-10-CM | POA: Diagnosis present

## 2015-05-14 DIAGNOSIS — J9601 Acute respiratory failure with hypoxia: Secondary | ICD-10-CM | POA: Diagnosis not present

## 2015-05-14 DIAGNOSIS — Z8249 Family history of ischemic heart disease and other diseases of the circulatory system: Secondary | ICD-10-CM

## 2015-05-14 DIAGNOSIS — I482 Chronic atrial fibrillation: Secondary | ICD-10-CM | POA: Diagnosis present

## 2015-05-14 DIAGNOSIS — I2510 Atherosclerotic heart disease of native coronary artery without angina pectoris: Secondary | ICD-10-CM | POA: Diagnosis present

## 2015-05-14 DIAGNOSIS — I5032 Chronic diastolic (congestive) heart failure: Secondary | ICD-10-CM

## 2015-05-14 DIAGNOSIS — E039 Hypothyroidism, unspecified: Secondary | ICD-10-CM | POA: Diagnosis not present

## 2015-05-14 DIAGNOSIS — E119 Type 2 diabetes mellitus without complications: Secondary | ICD-10-CM | POA: Diagnosis present

## 2015-05-14 DIAGNOSIS — I110 Hypertensive heart disease with heart failure: Principal | ICD-10-CM | POA: Diagnosis present

## 2015-05-14 DIAGNOSIS — E785 Hyperlipidemia, unspecified: Secondary | ICD-10-CM | POA: Diagnosis present

## 2015-05-14 DIAGNOSIS — Z6828 Body mass index (BMI) 28.0-28.9, adult: Secondary | ICD-10-CM

## 2015-05-14 DIAGNOSIS — Z85038 Personal history of other malignant neoplasm of large intestine: Secondary | ICD-10-CM | POA: Diagnosis not present

## 2015-05-14 DIAGNOSIS — I5031 Acute diastolic (congestive) heart failure: Secondary | ICD-10-CM | POA: Diagnosis not present

## 2015-05-14 DIAGNOSIS — Z950 Presence of cardiac pacemaker: Secondary | ICD-10-CM | POA: Diagnosis not present

## 2015-05-14 DIAGNOSIS — C189 Malignant neoplasm of colon, unspecified: Secondary | ICD-10-CM | POA: Diagnosis present

## 2015-05-14 DIAGNOSIS — R7989 Other specified abnormal findings of blood chemistry: Secondary | ICD-10-CM | POA: Diagnosis not present

## 2015-05-14 DIAGNOSIS — I4821 Permanent atrial fibrillation: Secondary | ICD-10-CM

## 2015-05-14 DIAGNOSIS — M1990 Unspecified osteoarthritis, unspecified site: Secondary | ICD-10-CM | POA: Diagnosis present

## 2015-05-14 DIAGNOSIS — I509 Heart failure, unspecified: Secondary | ICD-10-CM

## 2015-05-14 DIAGNOSIS — I441 Atrioventricular block, second degree: Secondary | ICD-10-CM | POA: Diagnosis present

## 2015-05-14 DIAGNOSIS — R0682 Tachypnea, not elsewhere classified: Secondary | ICD-10-CM | POA: Diagnosis not present

## 2015-05-14 DIAGNOSIS — R778 Other specified abnormalities of plasma proteins: Secondary | ICD-10-CM

## 2015-05-14 LAB — BASIC METABOLIC PANEL
ANION GAP: 11 (ref 5–15)
BUN: 8 mg/dL (ref 6–20)
CALCIUM: 8.9 mg/dL (ref 8.9–10.3)
CO2: 24 mmol/L (ref 22–32)
Chloride: 108 mmol/L (ref 101–111)
Creatinine, Ser: 0.83 mg/dL (ref 0.44–1.00)
GFR calc Af Amer: >60 mL/min (ref >60)
GFR, EST NON AFRICAN AMERICAN: 58 mL/min — AB (ref >60)
GLUCOSE: 223 mg/dL — AB (ref 65–99)
Potassium: 3.7 mmol/L (ref 3.5–5.1)
Sodium: 143 mmol/L (ref 135–145)

## 2015-05-14 LAB — PROTIME-INR
INR: 1.33 (ref 0.00–1.49)
Prothrombin Time: 16.6 seconds — ABNORMAL HIGH (ref 11.6–15.2)

## 2015-05-14 LAB — CBC
HEMATOCRIT: 34.9 % — AB (ref 36.0–46.0)
HEMOGLOBIN: 11.4 g/dL — AB (ref 12.0–15.0)
MCH: 30.8 pg (ref 26.0–34.0)
MCHC: 32.7 g/dL (ref 30.0–36.0)
MCV: 94.3 fL (ref 78.0–100.0)
Platelets: 357 10*3/uL (ref 150–400)
RBC: 3.7 MIL/uL — ABNORMAL LOW (ref 3.87–5.11)
RDW: 13.2 % (ref 11.5–15.5)
WBC: 8.8 10*3/uL (ref 4.0–10.5)

## 2015-05-14 LAB — MAGNESIUM: Magnesium: 1.5 mg/dL — ABNORMAL LOW (ref 1.7–2.4)

## 2015-05-14 LAB — GLUCOSE, CAPILLARY
GLUCOSE-CAPILLARY: 232 mg/dL — AB (ref 65–99)
GLUCOSE-CAPILLARY: 180 mg/dL — AB (ref 65–99)

## 2015-05-14 LAB — TROPONIN I
TROPONIN I: 0.14 ng/mL — AB (ref <0.031)
Troponin I: 0.12 ng/mL — ABNORMAL HIGH (ref <0.031)

## 2015-05-14 LAB — I-STAT TROPONIN, ED: TROPONIN I, POC: 0.12 ng/mL — AB (ref 0.00–0.08)

## 2015-05-14 LAB — BRAIN NATRIURETIC PEPTIDE: B NATRIURETIC PEPTIDE 5: 455.4 pg/mL — AB (ref 0.0–100.0)

## 2015-05-14 MED ORDER — INSULIN ASPART 100 UNIT/ML ~~LOC~~ SOLN
0.0000 [IU] | Freq: Three times a day (TID) | SUBCUTANEOUS | Status: DC
  Administered 2015-05-14: 3 [IU] via SUBCUTANEOUS
  Administered 2015-05-15 (×3): 2 [IU] via SUBCUTANEOUS
  Administered 2015-05-16: 3 [IU] via SUBCUTANEOUS
  Administered 2015-05-16: 1 [IU] via SUBCUTANEOUS
  Administered 2015-05-16 – 2015-05-17 (×2): 2 [IU] via SUBCUTANEOUS

## 2015-05-14 MED ORDER — SODIUM CHLORIDE 0.9 % IJ SOLN
3.0000 mL | Freq: Two times a day (BID) | INTRAMUSCULAR | Status: DC
  Administered 2015-05-17: 3 mL via INTRAVENOUS

## 2015-05-14 MED ORDER — SODIUM CHLORIDE 0.9 % IV SOLN: 250.0000 mL | INTRAVENOUS | Status: DC | PRN

## 2015-05-14 MED ORDER — FUROSEMIDE 10 MG/ML IJ SOLN
40.0000 mg | Freq: Two times a day (BID) | INTRAMUSCULAR | Status: DC
  Administered 2015-05-14 – 2015-05-16 (×5): 40 mg via INTRAVENOUS
  Filled 2015-05-14 (×5): qty 4

## 2015-05-14 MED ORDER — FUROSEMIDE 10 MG/ML IJ SOLN
20.0000 mg | Freq: Once | INTRAMUSCULAR | Status: AC
  Administered 2015-05-14: 20 mg via INTRAVENOUS
  Filled 2015-05-14: qty 2

## 2015-05-14 MED ORDER — ONDANSETRON HCL 4 MG PO TABS: 4.0000 mg | ORAL_TABLET | Freq: Four times a day (QID) | ORAL | Status: DC | PRN

## 2015-05-14 MED ORDER — FUROSEMIDE 10 MG/ML IJ SOLN
20.0000 mg | Freq: Once | INTRAMUSCULAR | Status: DC
  Filled 2015-05-14: qty 2

## 2015-05-14 MED ORDER — APIXABAN 5 MG PO TABS
5.0000 mg | ORAL_TABLET | Freq: Two times a day (BID) | ORAL | Status: DC
  Administered 2015-05-14 – 2015-05-17 (×6): 5 mg via ORAL
  Filled 2015-05-14 (×7): qty 1

## 2015-05-14 MED ORDER — SODIUM CHLORIDE 0.9 % IJ SOLN
3.0000 mL | Freq: Two times a day (BID) | INTRAMUSCULAR | Status: DC
  Administered 2015-05-14 – 2015-05-17 (×7): 3 mL via INTRAVENOUS

## 2015-05-14 MED ORDER — LOSARTAN POTASSIUM 50 MG PO TABS
100.0000 mg | ORAL_TABLET | Freq: Every day | ORAL | Status: DC
  Administered 2015-05-14 – 2015-05-17 (×4): 100 mg via ORAL
  Filled 2015-05-14 (×6): qty 2

## 2015-05-14 MED ORDER — SODIUM CHLORIDE 0.9 % IJ SOLN: 3.0000 mL | INTRAMUSCULAR | Status: DC | PRN

## 2015-05-14 MED ORDER — SIMVASTATIN 40 MG PO TABS
40.0000 mg | ORAL_TABLET | Freq: Every day | ORAL | Status: DC
  Administered 2015-05-14 – 2015-05-16 (×3): 40 mg via ORAL
  Filled 2015-05-14 (×3): qty 1

## 2015-05-14 MED ORDER — LEVOTHYROXINE SODIUM 75 MCG PO TABS
75.0000 ug | ORAL_TABLET | Freq: Every day | ORAL | Status: DC
  Administered 2015-05-15 – 2015-05-17 (×3): 75 ug via ORAL
  Filled 2015-05-14 (×3): qty 1

## 2015-05-14 MED ORDER — INSULIN ASPART 100 UNIT/ML ~~LOC~~ SOLN: 0.0000 [IU] | Freq: Every day | SUBCUTANEOUS | Status: DC

## 2015-05-14 MED ORDER — ONDANSETRON HCL 4 MG/2ML IJ SOLN: 4.0000 mg | Freq: Four times a day (QID) | INTRAMUSCULAR | Status: DC | PRN

## 2015-05-14 NOTE — ED Provider Notes (Signed)
CSN: [id number]     Arrival date & time 05/14/15  0930 History   First MD Initiated Contact with Patient 05/14/15 1013     Chief Complaint  Patient presents with  . Chest Pain    HPI   Ms. Segarra is an 82 y.o. female with history of CAD, AV block (s/p pacemaker placement), chronic afib, colon cancer, HTN, HLD, DM who presents to the ED for evaluation of chest pain and weakness. Pt lives at home by herself. She is accompanied by two family members who help clarify her history at times. Pt states she was in her usual state of health until two days ago when she had a "funny feeling" in her chest. She states it went away on its own and she did not tell anyone about it. She states this morning she started experiencing a similar feeling along with weakness and nausea so she called her niece. Denies any episodes of emesis. In the ED now pt states she feels weak and doesn't feel good but cannot elaborate. She denies current chest pain. She does endorse SOB and increased WOB. States she has noticed increased cough recently as well. Denies feeing faint or lightheaded. Denies diaphoresis. Denies fever, chills, abd pain, nausea.   Cardiologist: peter Martinique Pcp: Shamleffer, Herschell Dimes, MD   Past Medical History  Diagnosis Date  . Colitis   . Colon cancer ([place])   . Hypothyroidism   . Ventral hernia   . Mobitz (type) II atrioventricular block   . CAD (coronary artery disease)   . Diverticulosis of colon   . HLD (hyperlipidemia)   . DJD (degenerative joint disease)   . DM (diabetes mellitus) ([place])   . HTN (hypertension)   . Atrial fibrillation [facility])    Past Surgical History  Procedure Laterality Date  . Appendectomy    . Hemicolectomy  10/07    right  . Pacemaker insertion  07/06/08    MDT [id number] implanted for Mobitz II heart block by Dr Rayann Heman  . Coronary angioplasty  07/03/2008  . Cardiac catheterization  08/22/1997  . Rotator cuff repair    . Transthoracic echocardiogram  06/29/2008   EF 55-60%   Family History  Problem Relation Age of Onset  . Heart attack Father    Social History  Substance Use Topics  . Smoking status: Never Smoker   . Smokeless tobacco: Never Used  . Alcohol Use: No   OB History    No data available     Review of Systems  All other systems reviewed and are negative.     Allergies  Diclofenac sodium; Hytrin; Ibuprofen; Norvasc; and Plavix  Home Medications   Prior to Admission medications   Medication Sig Start Date End Date Taking? Authorizing Provider  apixaban (ELIQUIS) 5 MG TABS tablet Take 1 tablet (5 mg total) by mouth 2 (two) times daily. 02/26/15  Yes Peter M Martinique, MD  furosemide (LASIX) 40 MG tablet Take 1 tablet (40 mg total) by mouth daily. 11/10/12  Yes Peter M Martinique, MD  levothyroxine (SYNTHROID, LEVOTHROID) 75 MCG tablet Take 75 mcg by mouth daily before breakfast.   Yes Historical Provider, MD  losartan (COZAAR) 100 MG tablet TAKE ONE TABLET BY MOUTH ONCE DAILY 02/26/15  Yes Peter M Martinique, MD  nitroGLYCERIN (NITROSTAT) 0.6 MG SL tablet Place 1 tablet (0.6 mg total) under the tongue every 5 (five) minutes as needed for chest pain (MAX 3 TABLETS). 09/25/14 10/20/15 Yes Thompson Grayer, MD  simvastatin (ZOCOR) 40  MG tablet Take 1 tablet (40 mg total) by mouth at bedtime. 11/10/12  Yes Peter M Martinique, MD   BP 184/91 mmHg  Pulse 60  Temp(Src) 97.6 F (36.4 C) (Oral)  Resp 20  Ht 5\' 4"  (1.626 m)  Wt 71.668 kg  BMI 27.11 kg/m2  SpO2 97% Physical Exam  Constitutional: She is oriented to person, place, and time. No distress.  HENT:  Right Ear: External ear normal.  Left Ear: External ear normal.  Nose: Nose normal.  Mouth/Throat: Oropharynx is clear and moist.  Eyes: Conjunctivae and EOM are normal. Pupils are equal, round, and reactive to light.  Neck: Normal range of motion. Neck supple.  Cardiovascular: Normal rate, regular rhythm, normal heart sounds and intact distal pulses.   Pulmonary/Chest: Breath sounds normal.  No respiratory distress. She has no decreased breath sounds. She has no wheezes. She exhibits no tenderness.  Pt with some increased WOB but is able to speak in full sentences. Not tachypneic. No tripoding. No retractions or accessor muscle usage.   Abdominal: Soft. Bowel sounds are normal. She exhibits no distension. There is no tenderness. There is no rebound and no guarding.  Musculoskeletal: She exhibits no edema.  Neurological: She is alert and oriented to person, place, and time. No cranial nerve deficit.  Skin: Skin is warm and dry. She is not diaphoretic.  Psychiatric: She has a normal mood and affect.  Nursing note and vitals reviewed.  Filed Vitals:   05/14/15 0936 05/14/15 0938 05/14/15 1015  BP: 186/77  184/91  Pulse: 60  60  Temp: 97.6 F (36.4 C)    TempSrc: Oral    Resp: 19  20  Height: 5\' 4"  (1.626 m)    Weight: 71.668 kg    SpO2: 94% 94% 97%     ED Course  Procedures (including critical care time) Labs Review Labs Reviewed  BASIC METABOLIC PANEL - Abnormal; Notable for the following:    Glucose, Bld 223 (*)    GFR calc non Af Amer 58 (*)    All other components within normal limits  CBC - Abnormal; Notable for the following:    RBC 3.70 (*)    Hemoglobin 11.4 (*)    HCT 34.9 (*)    All other components within normal limits  PROTIME-INR - Abnormal; Notable for the following:    Prothrombin Time 16.6 (*)    All other components within normal limits  I-STAT TROPOININ, ED - Abnormal; Notable for the following:    Troponin i, poc 0.12 (*)    All other components within normal limits    Imaging Review Dg Chest 2 View  05/14/2015  CLINICAL DATA:  Chest discomfort today EXAM: CHEST  2 VIEW COMPARISON:  04/25/2009 FINDINGS: Heart is moderately enlarged. Vascular congestion. Central and basilar airspace edema. Bilateral pleural effusions. Right subclavian dual lead pacemaker device and leads are stable and intact. IMPRESSION: CHF with bilateral central and basilar  airspace edema and bilateral pleural effusions. Electronically Signed   By: Marybelle Killings M.D.   On: 05/14/2015 10:45   I have personally reviewed and evaluated these images and lab results as part of my medical decision-making.   EKG Interpretation   Date/Time:  Monday May 14 2015 09:36:52 EST Ventricular Rate:  60 PR Interval:    QRS Duration: 179 QT Interval:  545 QTC Calculation: 545 R Axis:   -70 Text Interpretation:  Junctional rhythm Left bundle branch block agree.no  sig change from old Confirmed by [place],  MD, Jeannie Done [phone]) on 05/14/2015  10:52:53 AM      MDM   Final diagnoses:  Acute congestive heart failure, unspecified congestive heart failure type ([place])    Pt is an 82 y.o. female who lives alone with significant cardiac history presenting with chest pain, SOB, weakness. She is not currently having chest pain. Troponin 0.12. CXR shows CHF picture with bilateral pleural effusions and airspace edema. Will call hospitalists for CHF admission.     Anne Ng, PA-C 05/14/15 [place], MD 05/16/15 [phone]

## 2015-05-14 NOTE — ED Notes (Signed)
Pt. Coming from home via [facility] c/o a funny feeling in her chest. Two days ago pt. Experienced the first episode of a funny feeling in her chest and did not seek tx for it. This morning pt. Awoken with same funny feeling in her chest. Pt. Given 324 ASA en route. Pt. Pacemaker on left side. Pt. Paced at 60 bpm at home and upon arrival EMS reported she went up to 90 bpm. Pt. On eliquis. Pt. AOX4

## 2015-05-14 NOTE — ED Notes (Signed)
Pt. O2 saturation staying in low 90's. Pt. Placed on 2L O2 via [place].

## 2015-05-14 NOTE — H&P (Signed)
[facility] History and Physical  Courage Mower [id number] DOB: 11/19/1916 DOA: 05/14/2015  Referring physician: [place] PCP: Lottie Dawson, MD   Chief Complaint: Funny feeling in chest.   HPI: [name] is a 82 y.o. female  History provided by patient. Patient was at home by herself. She is accompanied by multiple family members. Patient has been in her normal state of health up until approximately 2 days ago when she started developing a "funny feeling, "in her chest. Initially intermittent but now constant. Associated with shortness of breath and dyspnea on exertion and dry cough. Denies any fevers, orthopnea, diaphoresis, chills, chest pain, palpitations, LOC. States that she feels weak in general been unable to give more specific details. Increased leg swelling over past several days.   Review of Systems:  Constitutional:  No weight loss, night sweats, Fevers,  HEENT:  No headaches, Difficulty swallowing,Tooth/dental problems,Sore throat, Cardio-vascular: Per HPI GI:  No heartburn, indigestion, abdominal pain, nausea, vomiting, diarrhea, change in bowel habits, loss of appetite  Resp: Per HPI Skin:  no rash or lesions.  GU:  no dysuria, change in color of urine, no urgency or frequency. No flank pain.  Musculoskeletal:   No joint pain or swelling. No decreased range of motion. No back pain.  Psych:  No change in mood or affect. No depression or anxiety. No memory loss.  Neuro:  No change in sensation, unilateral strength, or cognitive abilities  All other systems were reviewed and are negative.  Past Medical History  Diagnosis Date  . Colitis   . Colon cancer ([place])   . Hypothyroidism   . Ventral hernia   . Mobitz (type) II atrioventricular block   . CAD (coronary artery disease)   . Diverticulosis of colon   . HLD (hyperlipidemia)   . DJD (degenerative joint disease)   . DM (diabetes mellitus) ([place])   . HTN (hypertension)    . Atrial fibrillation [facility])    Past Surgical History  Procedure Laterality Date  . Appendectomy    . Hemicolectomy  10/07    right  . Pacemaker insertion  07/06/08    MDT [id number] implanted for Mobitz II heart block by Dr Rayann Heman  . Coronary angioplasty  07/03/2008  . Cardiac catheterization  08/22/1997  . Rotator cuff repair    . Transthoracic echocardiogram  06/29/2008    EF 55-60%   Social History:  reports that she has never smoked. She has never used smokeless tobacco. She reports that she does not drink alcohol or use illicit drugs.  Allergies  Allergen Reactions  . Diclofenac Sodium     UNKNOWN  . Hytrin [Terazosin Hcl]     UNKNOWN   . Ibuprofen     UNKNOWN   . Norvasc [Amlodipine Besylate]     UNKNOWN   . Plavix [Clopidogrel Bisulfate]     UNKNOWN     Family History  Problem Relation Age of Onset  . Heart attack Father      Prior to Admission medications   Medication Sig Start Date End Date Taking? Authorizing Provider  apixaban (ELIQUIS) 5 MG TABS tablet Take 1 tablet (5 mg total) by mouth 2 (two) times daily. 02/26/15  Yes Peter M Martinique, MD  furosemide (LASIX) 40 MG tablet Take 1 tablet (40 mg total) by mouth daily. 11/10/12  Yes Peter M Martinique, MD  levothyroxine (SYNTHROID, LEVOTHROID) 75 MCG tablet Take 75 mcg by mouth daily before breakfast.   Yes Historical  Provider, MD  losartan (COZAAR) 100 MG tablet TAKE ONE TABLET BY MOUTH ONCE DAILY 02/26/15  Yes Peter M Martinique, MD  nitroGLYCERIN (NITROSTAT) 0.6 MG SL tablet Place 1 tablet (0.6 mg total) under the tongue every 5 (five) minutes as needed for chest pain (MAX 3 TABLETS). 09/25/14 10/20/15 Yes Thompson Grayer, MD  simvastatin (ZOCOR) 40 MG tablet Take 1 tablet (40 mg total) by mouth at bedtime. 11/10/12  Yes Peter M Martinique, MD   Physical Exam: Filed Vitals:   05/14/15 [id number] 05/14/15 1015 05/14/15 1100 05/14/15 1115  BP:  184/91 160/70 165/87  Pulse:  60 61 61  Temp:      TempSrc:      Resp:  20 25 19   Height:       Weight:      SpO2: 94% 97% 98% 97%    Wt Readings from Last 3 Encounters:  05/14/15 71.668 kg (158 lb)  09/25/14 72.031 kg (158 lb 12.8 oz)  08/04/14 70.506 kg (155 lb 7 oz)    General:  Appears calm and comfortable Eyes:  PERRL, EOMI, normal lids, iris ENT:  grossly lips & tongue, hard of hearing Neck:  no LAD, masses or thyromegaly Cardiovascular: RRR, II/VI . 1+ LE edema.  Respiratory: Diminished in bases. few crackles, speaks in shortened sentences. On [place] 2L.  CTA bilaterally, no w/r/r. Normal respiratory effort. Abdomen:  soft, ntnd Skin:  no rash or induration seen on limited exam Musculoskeletal:  grossly normal tone BUE/BLE Psychiatric:  grossly normal mood and affect, speech fluent and appropriate Neurologic:  CN 2-12 grossly intact, moves all extremities in coordinated fashion.          Labs on Admission:  Basic Metabolic Panel:  Recent Labs Lab 05/14/15 1010  NA 143  K 3.7  CL 108  CO2 24  GLUCOSE 223*  BUN 8  CREATININE 0.83  CALCIUM 8.9   Liver Function Tests: No results for input(s): AST, ALT, ALKPHOS, BILITOT, PROT, ALBUMIN in the last 168 hours. No results for input(s): LIPASE, AMYLASE in the last 168 hours. No results for input(s): AMMONIA in the last 168 hours. CBC:  Recent Labs Lab 05/14/15 1010  WBC 8.8  HGB 11.4*  HCT 34.9*  MCV 94.3  PLT 357   Cardiac Enzymes: No results for input(s): CKTOTAL, CKMB, CKMBINDEX, TROPONINI in the last 168 hours.  BNP (last 3 results) No results for input(s): BNP in the last 8760 hours.  ProBNP (last 3 results) No results for input(s): PROBNP in the last 8760 hours.   CREATININE: 0.83 (05/14/15 1010) Estimated creatinine clearance - 38.5 mL/min  CBG: No results for input(s): GLUCAP in the last 168 hours.  Radiological Exams on Admission: Dg Chest 2 View  05/14/2015  CLINICAL DATA:  Chest discomfort today EXAM: CHEST  2 VIEW COMPARISON:  04/25/2009 FINDINGS: Heart is moderately enlarged.  Vascular congestion. Central and basilar airspace edema. Bilateral pleural effusions. Right subclavian dual lead pacemaker device and leads are stable and intact. IMPRESSION: CHF with bilateral central and basilar airspace edema and bilateral pleural effusions. Electronically Signed   By: Marybelle Killings M.D.   On: 05/14/2015 10:45  Acute on Chronic  Assessment/Plan Active Problems:   CHF (congestive heart failure) (HCC)   PAF (paroxysmal atrial fibrillation) (HCC)   Acute diastolic CHF (congestive heart failure) (HCC)   Acute respiratory failure (HCC)   Elevated troponin   Type II diabetes mellitus, well controlled ([place])   Hypothyroid   Acute respiratory failure: New O2  requirement. Likely secondary to acute decompensating CHF. Previous echo from 2010 showing EF 55% and no mention of diastolic dysfunction. [place] history significant for Mobitz type II AV block, CAD, atrial fibrillation. Pacemaker insertion in 2010. Unsure of dry weight. No direct evidence of COPD, CAP, or other etiology for ARF. - Tele - IV lasix 40 BID (home 40 po daily) - O2 prn - Echo - PT.OT - strict I/O, dly wts - continue ARB - Consider bblocker after DC if BP able to tolerate - consider Cards consult if not improving.   Elevated Trop: Likely demand related. - Trend troponin - Telemetry  Afib: EKG w/ ?LBBB and junctional rhythm. Pacemaker in place. Followed by Dr. Rayann Heman of EP. CHADSVASC 6. - continue Eliquis.   HTN:  - continue losartan  Hypothyroid - continue synthroid  HLD: - continue statin  DM: diet controlled. Gluc 223 on admission - A1c - SSI   Code Status: DNR  DVT Prophylaxis: Eliquis Family Communication: neices Disposition Plan: Pending Improvement    Trusten Hume Lenna Sciara, MD Family Medicine [facility] [email or web address] Password [email or web address]

## 2015-05-15 LAB — GLUCOSE, CAPILLARY
GLUCOSE-CAPILLARY: 192 mg/dL — AB (ref 65–99)
Glucose-Capillary: 160 mg/dL — ABNORMAL HIGH (ref 65–99)
Glucose-Capillary: 181 mg/dL — ABNORMAL HIGH (ref 65–99)
Glucose-Capillary: 159 mg/dL — ABNORMAL HIGH (ref 65–99)

## 2015-05-15 LAB — COMPREHENSIVE METABOLIC PANEL
ALBUMIN: 3.3 g/dL — AB (ref 3.5–5.0)
ALT: 51 U/L (ref 14–54)
ANION GAP: 13 (ref 5–15)
AST: 58 U/L — AB (ref 15–41)
Alkaline Phosphatase: 120 U/L (ref 38–126)
BILIRUBIN TOTAL: 1.3 mg/dL — AB (ref 0.3–1.2)
BUN: 8 mg/dL (ref 6–20)
CALCIUM: 8.8 mg/dL — AB (ref 8.9–10.3)
CO2: 26 mmol/L (ref 22–32)
Chloride: 102 mmol/L (ref 101–111)
Creatinine, Ser: 0.85 mg/dL (ref 0.44–1.00)
GFR calc Af Amer: >60 mL/min (ref >60)
GFR calc non Af Amer: 56 mL/min — ABNORMAL LOW (ref >60)
GLUCOSE: 174 mg/dL — AB (ref 65–99)
Potassium: 4.1 mmol/L (ref 3.5–5.1)
Sodium: 141 mmol/L (ref 135–145)
TOTAL PROTEIN: 6.8 g/dL (ref 6.5–8.1)

## 2015-05-15 LAB — CBC
HEMATOCRIT: 35.9 % — AB (ref 36.0–46.0)
Hemoglobin: 11.8 g/dL — ABNORMAL LOW (ref 12.0–15.0)
MCH: 31.1 pg (ref 26.0–34.0)
MCHC: 32.9 g/dL (ref 30.0–36.0)
MCV: 94.5 fL (ref 78.0–100.0)
PLATELETS: 397 10*3/uL (ref 150–400)
RBC: 3.8 MIL/uL — ABNORMAL LOW (ref 3.87–5.11)
RDW: 13.6 % (ref 11.5–15.5)
WBC: 13.2 10*3/uL — AB (ref 4.0–10.5)

## 2015-05-15 LAB — TROPONIN I: Troponin I: 0.1 ng/mL — ABNORMAL HIGH (ref <0.031)

## 2015-05-15 LAB — HEMOGLOBIN A1C
HEMOGLOBIN A1C: 8.4 % — AB (ref 4.8–5.6)
Mean Plasma Glucose: 194 mg/dL

## 2015-05-15 NOTE — Progress Notes (Signed)
PROGRESS NOTE  [name] [id number] DOB: Oct 09, 1916 DOA: 05/14/2015 PCP: Lottie Dawson, MD   HPI: 82 yo F independent and living by herself admitted 1/2 with "funny feeling" in her chest, weakness and increased LE swelling.   Subjective / 24 H Interval events - feeling much better this morning, able to ambulate in the room without difficulties.   Assessment/Plan: Active Problems:   CHF (congestive heart failure) (HCC)   PAF (paroxysmal atrial fibrillation) (HCC)   Acute diastolic CHF (congestive heart failure) (HCC)   Acute respiratory failure (HCC)   Elevated troponin   Type II diabetes mellitus, well controlled ([place])   Hypothyroid  Acute on chronic diastolic heart failure - patient diuresed on admission, net negative 2.5L, weight down 157 >> 148 lbs. - still fluid overloaded today with JVD, LE edema, crackles, will continue IV Lasix 40 BID. Renal function stable   Atrial fibrillation, permanent - patient's CHA2DS2-VASc Score for Stroke Risk is 5, continue Apixaban  - has pacemaker   HTN - continue Losartan, BP controlled this morning  HLD - continue Simvastatin  DM - SSI insulin - poorly controlled, A1C 8.4 this admission  Hypothyroidism  - continue synthroid  - obtain TSH    Diet: Diet Heart Room service appropriate?: Yes; Fluid consistency:: Thin Fluids: none DVT Prophylaxis: Eliquis  Code Status: DNR Family Communication: d/w niece bedside  Disposition Plan: home when ready   Barriers to discharge: IV diuresis   Consultants:  None   Procedures:  2D ehco Study Conclusions - Left ventricle: There is basal inferior and inferoseptalhypokinesis. The cavity size was normal. There was moderateconcentric hypertrophy. Systolic function was normal. Theestimated ejection fraction was in the range of 50% to 55%. Wallmotion was normal; there were no regional wall motionabnormalities. Doppler parameters are consistent with  restrictivephysiology, indicative of decreased left ventricular diastoliccompliance and/or increased left atrial pressure. - Aortic valve: Trileaflet; normal thickness leaflets. There was noregurgitation. - Aortic root: The aortic root was normal in size. - Mitral valve: Calcified annulus. Mildly thickened leaflets.There was moderate regurgitation. - Left atrium: The atrium was severely dilated. - Right ventricle: The cavity size was moderately dilated. Wallthickness was normal. Systolic function was moderately reduced. - Right atrium: The atrium was mildly dilated. - Tricuspid valve: There was moderate regurgitation. - Pulmonary arteries: Systolic pressure was moderately to severelyincreased. PA peak pressure: 51 mm Hg (S). - Inferior vena cava: The vessel was dilated. The respirophasicdiameter changes were blunted (< 50%), consistent with elevated central venous pressure.   Antibiotics  Anti-infectives    None       Studies  Dg Chest 2 View  05/14/2015  CLINICAL DATA:  Chest discomfort today EXAM: CHEST  2 VIEW COMPARISON:  04/25/2009 FINDINGS: Heart is moderately enlarged. Vascular congestion. Central and basilar airspace edema. Bilateral pleural effusions. Right subclavian dual lead pacemaker device and leads are stable and intact. IMPRESSION: CHF with bilateral central and basilar airspace edema and bilateral pleural effusions. Electronically Signed   By: Marybelle Killings M.D.   On: 05/14/2015 10:45    Objective  Filed Vitals:   05/14/15 2206 05/15/15 0131 05/15/15 0450 05/15/15 0923  BP: 170/79 164/62 170/58 148/61  Pulse: 64 61 70 58  Temp:  97.8 F (36.6 C) 98 F (36.7 C) 98 F (36.7 C)  TempSrc:  Oral Oral Oral  Resp: 22   22  Height:      Weight:   67.405 kg (148 lb 9.6 oz)   SpO2: 94% 97%  98% 100%    Intake/Output Summary (Last 24 hours) at 05/15/15 1056 Last data filed at 05/15/15 1002  Gross per 24 hour  Intake    840 ml  Output   3725 ml  Net  -2885  ml   Filed Weights   05/14/15 0936 05/14/15 1554 05/15/15 0450  Weight: 71.668 kg (158 lb) 71.305 kg (157 lb 3.2 oz) 67.405 kg (148 lb 9.6 oz)    Exam:  GENERAL: NAD  HEENT: no scleral icterus, PERRL  NECK: supple, no LAD  LUNGS: CTA biL, no wheezing, fine crackles at the bases  HEART: RRR without MRG. + JVD, 1-2 + pitting LE edema  ABDOMEN: soft, non tender  EXTREMITIES: no clubbing / cyanosis  NEUROLOGIC: non focal  Data Reviewed: Basic Metabolic Panel:  Recent Labs Lab 05/14/15 1010 05/14/15 1528 05/15/15 0336  NA 143  --  141  K 3.7  --  4.1  CL 108  --  102  CO2 24  --  26  GLUCOSE 223*  --  174*  BUN 8  --  8  CREATININE 0.83  --  0.85  CALCIUM 8.9  --  8.8*  MG  --  1.5*  --    Liver Function Tests:  Recent Labs Lab 05/15/15 0336  AST 58*  ALT 51  ALKPHOS 120  BILITOT 1.3*  PROT 6.8  ALBUMIN 3.3*   CBC:  Recent Labs Lab 05/14/15 1010 05/15/15 0336  WBC 8.8 13.2*  HGB 11.4* 11.8*  HCT 34.9* 35.9*  MCV 94.3 94.5  PLT 357 397   Cardiac Enzymes:  Recent Labs Lab 05/14/15 1528 05/14/15 2145 05/15/15 0336  TROPONINI 0.14* 0.12* 0.10*   BNP (last 3 results)  Recent Labs  05/14/15 1010  BNP 455.4*   CBG:  Recent Labs Lab 05/14/15 1705 05/14/15 2206 05/15/15 0531  GLUCAP 232* 180* 160*   Scheduled Meds: . apixaban  5 mg Oral BID  . furosemide  20 mg Intravenous Once  . furosemide  40 mg Intravenous BID  . insulin aspart  0-5 Units Subcutaneous QHS  . insulin aspart  0-9 Units Subcutaneous TID WC  . levothyroxine  75 mcg Oral QAC breakfast  . losartan  100 mg Oral Daily  . simvastatin  40 mg Oral QHS  . sodium chloride  3 mL Intravenous Q12H  . sodium chloride  3 mL Intravenous Q12H   Continuous Infusions:    Marzetta Board, MD [facility] Pager [phone]. If 7 PM - 7 AM, please contact night-coverage at [email or web address], password [facility] 05/15/2015, 10:56 AM  LOS: 1 day

## 2015-05-15 NOTE — Progress Notes (Signed)
CSW spoke with pt niece at niece request- niece had some concerns about pt return home to living alone and was interested in short term rehab.  CSW explained that this will depend on PT recommendation and pt choice if she is alert and oriented.  Nieces states that pt has been to [facility] in the past and that family preference would be [facility] vs [facility].  CSW will assess with pt following PT evaluation to determine pt needs.  Domenica Reamer, [place] Social Worker [phone]

## 2015-05-16 DIAGNOSIS — I5031 Acute diastolic (congestive) heart failure: Secondary | ICD-10-CM

## 2015-05-16 DIAGNOSIS — E039 Hypothyroidism, unspecified: Secondary | ICD-10-CM

## 2015-05-16 DIAGNOSIS — J9601 Acute respiratory failure with hypoxia: Secondary | ICD-10-CM

## 2015-05-16 DIAGNOSIS — E119 Type 2 diabetes mellitus without complications: Secondary | ICD-10-CM

## 2015-05-16 DIAGNOSIS — I480 Paroxysmal atrial fibrillation: Secondary | ICD-10-CM

## 2015-05-16 DIAGNOSIS — I509 Heart failure, unspecified: Secondary | ICD-10-CM

## 2015-05-16 DIAGNOSIS — R7989 Other specified abnormal findings of blood chemistry: Secondary | ICD-10-CM

## 2015-05-16 LAB — GLUCOSE, CAPILLARY
GLUCOSE-CAPILLARY: 136 mg/dL — AB (ref 65–99)
GLUCOSE-CAPILLARY: 184 mg/dL — AB (ref 65–99)
GLUCOSE-CAPILLARY: 219 mg/dL — AB (ref 65–99)
Glucose-Capillary: 171 mg/dL — ABNORMAL HIGH (ref 65–99)

## 2015-05-16 LAB — BASIC METABOLIC PANEL
ANION GAP: 11 (ref 5–15)
BUN: 10 mg/dL (ref 6–20)
CHLORIDE: 98 mmol/L — AB (ref 101–111)
CO2: 31 mmol/L (ref 22–32)
CREATININE: 1 mg/dL (ref 0.44–1.00)
Calcium: 8.3 mg/dL — ABNORMAL LOW (ref 8.9–10.3)
GFR calc non Af Amer: 46 mL/min — ABNORMAL LOW (ref >60)
GFR, EST AFRICAN AMERICAN: 53 mL/min — AB (ref >60)
GLUCOSE: 165 mg/dL — AB (ref 65–99)
Potassium: 3 mmol/L — ABNORMAL LOW (ref 3.5–5.1)
Sodium: 140 mmol/L (ref 135–145)

## 2015-05-16 LAB — TSH: TSH: 8.303 u[IU]/mL — ABNORMAL HIGH (ref 0.350–4.500)

## 2015-05-16 MED ORDER — POTASSIUM CHLORIDE CRYS ER 20 MEQ PO TBCR: 40.0000 meq | EXTENDED_RELEASE_TABLET | Freq: Once | ORAL | Status: DC

## 2015-05-16 MED ORDER — POTASSIUM CHLORIDE CRYS ER 20 MEQ PO TBCR
40.0000 meq | EXTENDED_RELEASE_TABLET | Freq: Every day | ORAL | Status: DC
  Administered 2015-05-16: 40 meq via ORAL
  Filled 2015-05-16: qty 2

## 2015-05-16 NOTE — Progress Notes (Signed)
CSW met with patient and niece [name] earlier today to discuss request for short term SNF-and continued to request [facility] [place]. Physical Therapist evaluated patient and does not feel that she needs SNF placement. CSW spoke again after therapy session with patient's niece who verbalized full agreement that patient does not need SNF and plans to return home with Home Health. Discussed with RNCM who will arrange.   Niece and another family member plan to stay with patient for the next few days and plan to ask members of the church to help with meals.  She denies any further needs or questions.  CSW signing off.  Lorie [place]. Pauline Good, [place]

## 2015-05-16 NOTE — Progress Notes (Signed)
[facility]                                                                              Patient Demographics  [name], is a 82 y.o. female, DOB - Dec 14, 1916, [place] date - 05/14/2015   Admitting Physician Waldemar Dickens, MD  Outpatient Primary MD for the patient is Shamleffer, Herschell Dimes, MD  LOS - 2   Chief Complaint  Patient presents with  . Chest Pain       Brief HPI   Patient is a 82 year old female with history of CAD, hypothyroidism, diabetes, A. fib, hypertension presented with shortness of breath, dyspnea on exertion, dry cough and "funny feeling" in her chest. Patient was found to have generalized weakness with increased lower extremity swelling.   Assessment & Plan   Acute on chronic diastolic heart failure - patient placed on IV diuresis, net negative 4.2 L, weight down 157 >> 145lbs. -  continue IV Lasix 40 BID. Renal function stable - Replace potassium,   Transition to oral Lasix tomorrow  - Troponin slightly positive, flat, likely due to acute on chronic diastolic CHF, demand ischemia. Currently no chest pain.  Atrial fibrillation, permanent - patient's CHA2DS2-VASc Score for Stroke Risk is 5, continue Apixaban  - has pacemaker   HTN - continue Losartan, BP controlled   HLD - continue Simvastatin  DM - SSI insulin - poorly controlled, A1C 8.4 this admission  Hypothyroidism  - continue synthroid  -  TSH 8.3, room to increase Synthroid however currently has acute on chronic diastolic CHF, will defer to PCP to follow TSH closely and increase Synthroid to keep TSH under 4    Code Status DO NOT RESUSCITATE  Family Communication: Discussed in detail with the patient, all imaging results, lab results explained to the patient    Disposition Plan: Hopefully tomorrow, PT evaluation pending  Time Spent in minutes 25 minutes  Procedures  echo  Consults   None   DVT Prophylaxis   apixaban  Medications  Scheduled Meds: . apixaban  5 mg Oral BID  . furosemide  20 mg Intravenous Once  . furosemide  40 mg Intravenous BID  . insulin aspart  0-5 Units Subcutaneous QHS  . insulin aspart  0-9 Units Subcutaneous TID WC  . levothyroxine  75 mcg Oral QAC breakfast  . losartan  100 mg Oral Daily  . potassium chloride  40 mEq Oral Daily  . simvastatin  40 mg Oral QHS  . sodium chloride  3 mL Intravenous Q12H  . sodium chloride  3 mL Intravenous Q12H   Continuous Infusions:  PRN Meds:.sodium chloride, ondansetron **OR** ondansetron (ZOFRAN) IV, sodium chloride   Antibiotics   Anti-infectives    None        Subjective:   [name] was seen and examined today.  feels a lot better now, denies any chest pain, shortness of breath. Peripheral edema has increased and improved.  Patient denies dizziness, chest pain, shortness of breath, abdominal pain, N/V/D/C, new weakness, numbess, tingling. No acute events overnight.    Objective:   Blood pressure 144/75, pulse 61, temperature 98.6 F (  37 C), temperature source Oral, resp. rate 16, height 5\' 4"  (1.626 m), weight 65.8 kg (145 lb 1 oz), SpO2 97 %.  Wt Readings from Last 3 Encounters:  05/16/15 65.8 kg (145 lb 1 oz)  09/25/14 72.031 kg (158 lb 12.8 oz)  08/04/14 70.506 kg (155 lb 7 oz)     Intake/Output Summary (Last 24 hours) at 05/16/15 1106 Last data filed at 05/16/15 1104  Gross per 24 hour  Intake   1203 ml  Output   2800 ml  Net  -1597 ml    Exam  General: Alert and oriented x 3, NAD  HEENT:  PERRLA, EOMI, Anicteric Sclera, mucous membranes moist.   Neck: Supple, no JVD, no masses  CVS: S1 S2 auscultated, no rubs, murmurs or gallops. Regular rate and rhythm.  Respiratory: Clear to auscultation bilaterally, no wheezing, rales or rhonchi  Abdomen: Soft, nontender, nondistended, + bowel sounds  Ext: no cyanosis clubbing, trace edema  Neuro: AAOx3, Cr N's II- XII. Strength 5/5 upper and  lower extremities bilaterally  Skin: No rashes  Psych: Normal affect and demeanor, alert and oriented x3    Data Review   Micro Results No results found for this or any previous visit (from the past 240 hour(s)).  Radiology Reports Dg Chest 2 View  05/14/2015  CLINICAL DATA:  Chest discomfort today EXAM: CHEST  2 VIEW COMPARISON:  04/25/2009 FINDINGS: Heart is moderately enlarged. Vascular congestion. Central and basilar airspace edema. Bilateral pleural effusions. Right subclavian dual lead pacemaker device and leads are stable and intact. IMPRESSION: CHF with bilateral central and basilar airspace edema and bilateral pleural effusions. Electronically Signed   By: Marybelle Killings M.D.   On: 05/14/2015 10:45    CBC  Recent Labs Lab 05/14/15 1010 05/15/15 0336  WBC 8.8 13.2*  HGB 11.4* 11.8*  HCT 34.9* 35.9*  PLT 357 397  MCV 94.3 94.5  MCH 30.8 31.1  MCHC 32.7 32.9  RDW 13.2 13.6    Chemistries   Recent Labs Lab 05/14/15 1010 05/14/15 1528 05/15/15 0336 05/16/15 0325  NA 143  --  141 140  K 3.7  --  4.1 3.0*  CL 108  --  102 98*  CO2 24  --  26 31  GLUCOSE 223*  --  174* 165*  BUN 8  --  8 10  CREATININE 0.83  --  0.85 1.00  CALCIUM 8.9  --  8.8* 8.3*  MG  --  1.5*  --   --   AST  --   --  58*  --   ALT  --   --  51  --   ALKPHOS  --   --  120  --   BILITOT  --   --  1.3*  --    ------------------------------------------------------------------------------------------------------------------ estimated creatinine clearance is 30.7 mL/min (by C-G formula based on Cr of 1). ------------------------------------------------------------------------------------------------------------------  Recent Labs  05/14/15 1520  HGBA1C 8.4*   ------------------------------------------------------------------------------------------------------------------ No results for input(s): CHOL, HDL, LDLCALC, TRIG, CHOLHDL, LDLDIRECT in the last 72  hours. ------------------------------------------------------------------------------------------------------------------  Recent Labs  05/16/15 0325  TSH 8.303*   ------------------------------------------------------------------------------------------------------------------ No results for input(s): VITAMINB12, FOLATE, FERRITIN, TIBC, IRON, RETICCTPCT in the last 72 hours.  Coagulation profile  Recent Labs Lab 05/14/15 1010  INR 1.33    No results for input(s): DDIMER in the last 72 hours.  Cardiac Enzymes  Recent Labs Lab 05/14/15 1528 05/14/15 2145 05/15/15 0336  TROPONINI 0.14* 0.12* 0.10*   ------------------------------------------------------------------------------------------------------------------ Mertha Finders  input(s): POCBNP   Recent Labs  05/14/15 2206 05/15/15 0531 05/15/15 1109 05/15/15 1613 05/15/15 2230 05/16/15 0609  GLUCAP 180* 160* 181* 192* 159* 136*     RAI,RIPUDEEP M.D. [facility] 05/16/2015, 11:06 AM  Pager: [phone] Between 7am to 7pm - call Pager - [phone]  After 7pm go to [email or web address] - password [email or web address]  Call night coverage person covering after 7pm

## 2015-05-16 NOTE — Progress Notes (Signed)
OT Cancellation Note  Patient Details Name: [name] MRN: [id number] DOB: 1916-12-09   Cancelled Treatment:    Reason Eval/Treat Not Completed: OT screened, no needs identified, will sign off  Darlina Rumpf Benton, OTR/L [id number]  05/16/2015, 3:19 PM

## 2015-05-16 NOTE — Progress Notes (Signed)
Assumed care from Santiago Glad, [place].

## 2015-05-16 NOTE — Evaluation (Signed)
Physical Therapy Evaluation and Discharge Patient Details Name: [name] MRN: [id number] DOB: 07/02/1916 Today's Date: 05/16/2015   History of Present Illness  82 y.o female admitted with Acute on chronic diastolic heart failure, Atrial fibrillation, permanent, HTN, and DM.  Clinical Impression  Patient evaluated by Physical Therapy with no further acute PT needs identified. All education has been completed and the patient has no further questions. Pt feels and niece confirms pt gait and overall mobility appear to be back to baseline. Pt able to ambulate with and without assistive device (more stable with) and did not require physical assist for balance. Safely navigates 7 steps today and states she feels confident with this task. 1 fall in the past 6 months, would benefit from home safety evaluation by HHPT. Family requests education for her dieting as they are concerned she eats too much salt. See below for any follow-up Physical Therapy or equipment needs. PT is signing off. Thank you for this referral.     Follow Up Recommendations Home health PT;Supervision - Intermittent    Equipment Recommendations  None recommended by PT    Recommendations for Other Services       Precautions / Restrictions Precautions Precautions: None Restrictions Weight Bearing Restrictions: No      Mobility  Bed Mobility Overal bed mobility: Modified Independent             General bed mobility comments: extra time to rise from bed without physical assist  Transfers Overall transfer level: Needs assistance Equipment used: Rolling walker (2 wheeled) Transfers: Sit to/from Stand Sit to Stand: Supervision         General transfer comment: supervision for safety. No physical assist required. Mod I from toilet. Required extra time from bed.  Ambulation/Gait Ambulation/Gait assistance: Modified independent (Device/Increase time) Ambulation Distance (Feet): 200 Feet Assistive device:  Rolling walker (2 wheeled);None Gait Pattern/deviations: Step-through pattern;Decreased stride length;Scissoring   Gait velocity interpretation: at or above normal speed for age/gender General Gait Details: Ambulates without difficulty using a rolling walker. Without RW pt demonstrates slight instability and occasional scissoring but is able to self correct. No overt loss of balance or buckling noted.  Stairs Stairs: Yes Stairs assistance: Supervision Stair Management: One rail Right;Step to pattern;Sideways;Forwards Number of Stairs: 7 General stair comments: Using single rail, educated pt on different ways to navigate steps including forward and sideways approach. Did not require physical assistance. States she feels confident with this task.  Wheelchair Mobility    Modified Rankin (Stroke Patients Only)       Balance Overall balance assessment: Needs assistance Sitting-balance support: No upper extremity supported;Feet supported Sitting balance-Leahy Scale: Normal     Standing balance support: No upper extremity supported Standing balance-Leahy Scale: Good                               Pertinent Vitals/Pain Pain Assessment: No/denies pain    Home Living Family/patient expects to be discharged to:: Private residence Living Arrangements: Alone Available Help at Discharge: Friend(s);Available PRN/intermittently Type of Home: House Home Access: Stairs to enter Entrance Stairs-Rails: Psychiatric nurse of Steps: 4-5 Home Layout: One level Home Equipment: Grab bars - tub/shower;Walker - 2 wheels;Cane - single point;Grab bars - toilet      Prior Function Level of Independence: Independent with assistive device(s)         Comments: uses RW only when she first gets up in the AM.  Hand Dominance   Dominant Hand: Right    Extremity/Trunk Assessment   Upper Extremity Assessment: Defer to OT evaluation           Lower Extremity  Assessment: Generalized weakness         Communication   Communication: No difficulties  Cognition Arousal/Alertness: Awake/alert Behavior During Therapy: WFL for tasks assessed/performed Overall Cognitive Status: Within Functional Limits for tasks assessed                      General Comments General comments (skin integrity, edema, etc.): Niece present. States pt appears to be at baseline. Is concerned with pt diet and requests meals on wheels    Exercises        Assessment/Plan    PT Assessment Patent does not need any further PT services  PT Diagnosis Abnormality of gait;Generalized weakness   PT Problem List    PT Treatment Interventions     PT Goals (Current goals can be found in the Care Plan section) Acute Rehab PT Goals Patient Stated Goal: none stated PT Goal Formulation: All assessment and education complete, DC therapy    Frequency     Barriers to discharge        Co-evaluation               End of Session   Activity Tolerance: Patient tolerated treatment well Patient left: in chair;with call bell/phone within reach;with chair alarm set;with family/visitor present           Time: [id number] PT Time Calculation (min) (ACUTE ONLY): 19 min   Charges:   PT Evaluation $PT Eval Low Complexity: 1 Procedure     PT G CodesEllouise Newer 05/16/2015, 11:34 AM Elayne Snare, [place]

## 2015-05-16 NOTE — Care Management Note (Signed)
Case Management Note  Patient Details  Name: [name] MRN: [id number] Date of Birth: [birth year]  Subjective/Objective:        Admitted with CHF            Action/Plan: Patient lives alone, has a supportive niece [name] that check on her frequently. Talked to [place] with patient's permission, request placed for Meals on Wheels; Patient could also benefit from a Disease Management Program for CHF. HHC choice offered, [name] chose [facility] called for arrangement. She has a walker at home.  Expected Discharge Date:     05/16/2015             Expected Discharge Plan:  [place]  Discharge planning Services  CM Consult  Choice offered to:  [facility] POA / Guardian   HH Arranged:  RN, Disease Management, PT, OT, Nurse's Aide, Social Work [employer] Agency:  [place]  Status of Service:  In process, will continue to follow   Sherrilyn Rist [id number] 05/16/2015, 2:56 PM

## 2015-05-17 ENCOUNTER — Telehealth: Payer: Self-pay | Admitting: Cardiology

## 2015-05-17 LAB — GLUCOSE, CAPILLARY
GLUCOSE-CAPILLARY: 168 mg/dL — AB (ref 65–99)
Glucose-Capillary: 151 mg/dL — ABNORMAL HIGH (ref 65–99)

## 2015-05-17 LAB — BASIC METABOLIC PANEL
ANION GAP: 12 (ref 5–15)
BUN: 14 mg/dL (ref 6–20)
CALCIUM: 8.6 mg/dL — AB (ref 8.9–10.3)
CO2: 31 mmol/L (ref 22–32)
Chloride: 98 mmol/L — ABNORMAL LOW (ref 101–111)
Creatinine, Ser: 0.84 mg/dL (ref 0.44–1.00)
GFR calc Af Amer: >60 mL/min (ref >60)
GFR, EST NON AFRICAN AMERICAN: 57 mL/min — AB (ref >60)
GLUCOSE: 160 mg/dL — AB (ref 65–99)
POTASSIUM: 3.7 mmol/L (ref 3.5–5.1)
SODIUM: 141 mmol/L (ref 135–145)

## 2015-05-17 MED ORDER — FUROSEMIDE 40 MG PO TABS
40.0000 mg | ORAL_TABLET | Freq: Every day | ORAL | Status: DC
  Administered 2015-05-17: 40 mg via ORAL
  Filled 2015-05-17: qty 1

## 2015-05-17 MED ORDER — POTASSIUM CHLORIDE CRYS ER 20 MEQ PO TBCR
20.0000 meq | EXTENDED_RELEASE_TABLET | Freq: Every day | ORAL | Status: DC
  Administered 2015-05-17: 20 meq via ORAL
  Filled 2015-05-17: qty 1

## 2015-05-17 NOTE — Discharge Summary (Signed)
Physician Discharge Summary   Patient ID: [name] MRN: [id number] DOB/AGE: [birth year] 82 y.o.  Admit date: 05/14/2015 Discharge date: 05/17/2015  Primary Care Physician:  Lottie Dawson, MD  Discharge Diagnoses:   Acute on chronic diastolic CHF Permanent atrial fibrillation Essential hypertension Hyperlipidemia Diabetes mellitus Hypothyroidism  Consults:  None  Recommendations for Outpatient Follow-up:  1. Home health PT OT arranged 2. Please repeat CBC/BMET at next visit, please follow TSH   DIET:carb modified diet  Allergies:   Allergies  Allergen Reactions  . Diclofenac Sodium     UNKNOWN  . Hytrin [Terazosin Hcl]     UNKNOWN   . Ibuprofen     UNKNOWN   . Norvasc [Amlodipine Besylate]     UNKNOWN   . Plavix [Clopidogrel Bisulfate]     UNKNOWN      DISCHARGE MEDICATIONS: Current Discharge Medication List    CONTINUE these medications which have NOT CHANGED   Details  apixaban (ELIQUIS) 5 MG TABS tablet Take 1 tablet (5 mg total) by mouth 2 (two) times daily. Qty: 60 tablet, Refills: 5    furosemide (LASIX) 40 MG tablet Take 1 tablet (40 mg total) by mouth daily. Qty: 90 tablet, Refills: 3    levothyroxine (SYNTHROID, LEVOTHROID) 75 MCG tablet Take 75 mcg by mouth daily before breakfast.    losartan (COZAAR) 100 MG tablet TAKE ONE TABLET BY MOUTH ONCE DAILY Qty: 30 tablet, Refills: 5    nitroGLYCERIN (NITROSTAT) 0.6 MG SL tablet Place 1 tablet (0.6 mg total) under the tongue every 5 (five) minutes as needed for chest pain (MAX 3 TABLETS). Qty: 25 tablet, Refills: 11    simvastatin (ZOCOR) 40 MG tablet Take 1 tablet (40 mg total) by mouth at bedtime. Qty: 90 tablet, Refills: 3         Brief H and P: For complete details please refer to admission H and P, but in brief Patient is a 82 year old female with history of CAD, hypothyroidism, diabetes, A. fib, hypertension presented with shortness of breath, dyspnea on exertion, dry  cough and "funny feeling" in her chest. Patient was found to have generalized weakness with increased lower extremity swelling  Hospital Course:  Acute on chronic diastolic heart failure - patient was placed on IV diuresis 40 mg twice a day , net negative  balance of 5.4  L, weight down 157 from admission to 145lbs at dischargRenal function stable, patient was transitioned to oral Lasix - Troponin slightly positive, flat, likely due to acute on chronic diastolic CHF, demand ischemia. Currently no chest pain. -Cardiology appointment was scheduled on 05/24/15 to adjust her Lasix dose as needed.   Atrial fibrillation, permanent - patient's CHA2DS2-VASc Score for Stroke Risk is 5, continue Apixaban  - has pacemaker   HTN - continue Losartan, BP controlled   HLD - continue Simvastatin  DM - patient was placed on sliding scale insulin while inpatient, currently diet controlled outpatient. However, A1C 8.4 this admission, recommend patient to be on low-dose oral hypoglycemic outpatient.   Hypothyroidism  - continue synthroid  - TSH 8.3, room to increase Synthroid however currently has acute on chronic diastolic CHF, will defer to PCP to follow TSH closely and increase Synthroid to keep TSH under 4     Day of Discharge BP 148/68 mmHg  Pulse 74  Temp(Src) 97.7 F (36.5 C) (Oral)  Resp 18  Ht 5\' 4"  (1.626 m)  Wt 65.908 kg (145 lb 4.8 oz)  BMI 24.93 kg/m2  SpO2 97%  Physical Exam: General: Alert and awake oriented x3 not in any acute distress. HEENT: anicteric sclera, pupils reactive to light and accommodation CVS: S1-S2 clear no murmur rubs or gallops Chest: clear to auscultation bilaterally, no wheezing rales or rhonchi Abdomen: soft nontender, nondistended, normal bowel sounds Extremities: no cyanosis, clubbing or edema noted bilaterally Neuro: Cranial nerves II-XII intact, no focal neurological deficits   The results of significant diagnostics from this hospitalization  (including imaging, microbiology, ancillary and laboratory) are listed below for reference.    LAB RESULTS: Basic Metabolic Panel:  Recent Labs Lab 05/14/15 1528  05/16/15 0325 05/17/15 0343  NA  --   < > 140 141  K  --   < > 3.0* 3.7  CL  --   < > 98* 98*  CO2  --   < > 31 31  GLUCOSE  --   < > 165* 160*  BUN  --   < > 10 14  CREATININE  --   < > 1.00 0.84  CALCIUM  --   < > 8.3* 8.6*  MG 1.5*  --   --   --   < > = values in this interval not displayed. Liver Function Tests:  Recent Labs Lab 05/15/15 0336  AST 58*  ALT 51  ALKPHOS 120  BILITOT 1.3*  PROT 6.8  ALBUMIN 3.3*   No results for input(s): LIPASE, AMYLASE in the last 168 hours. No results for input(s): AMMONIA in the last 168 hours. CBC:  Recent Labs Lab 05/14/15 1010 05/15/15 0336  WBC 8.8 13.2*  HGB 11.4* 11.8*  HCT 34.9* 35.9*  MCV 94.3 94.5  PLT 357 397   Cardiac Enzymes:  Recent Labs Lab 05/14/15 2145 05/15/15 0336  TROPONINI 0.12* 0.10*   BNP: Invalid input(s): POCBNP CBG:  Recent Labs Lab 05/16/15 2148 05/17/15 0621  GLUCAP 171* 151*    Significant Diagnostic Studies:  Dg Chest 2 View  05/14/2015  CLINICAL DATA:  Chest discomfort today EXAM: CHEST  2 VIEW COMPARISON:  04/25/2009 FINDINGS: Heart is moderately enlarged. Vascular congestion. Central and basilar airspace edema. Bilateral pleural effusions. Right subclavian dual lead pacemaker device and leads are stable and intact. IMPRESSION: CHF with bilateral central and basilar airspace edema and bilateral pleural effusions. Electronically Signed   By: Marybelle Killings M.D.   On: 05/14/2015 10:45    2D ECHO: Study Conclusions  - Left ventricle: There is basal inferior and inferoseptal hypokinesis. The cavity size was normal. There was moderate concentric hypertrophy. Systolic function was normal. The estimated ejection fraction was in the range of 50% to 55%. Wall motion was normal; there were no regional wall  motion abnormalities. Doppler parameters are consistent with restrictive physiology, indicative of decreased left ventricular diastolic compliance and/or increased left atrial pressure. - Aortic valve: Trileaflet; normal thickness leaflets. There was no regurgitation. - Aortic root: The aortic root was normal in size. - Mitral valve: Calcified annulus. Mildly thickened leaflets . There was moderate regurgitation. - Left atrium: The atrium was severely dilated. - Right ventricle: The cavity size was moderately dilated. Wall thickness was normal. Systolic function was moderately reduced. - Right atrium: The atrium was mildly dilated. - Tricuspid valve: There was moderate regurgitation. - Pulmonary arteries: Systolic pressure was moderately to severely increased. PA peak pressure: 51 mm Hg (S). - Inferior vena cava: The vessel was dilated. The respirophasic diameter changes were blunted (< 50%), consistent with elevated central venous pressure.  Disposition and Follow-up: Discharge Instructions    (  HEART FAILURE PATIENTS) Call MD:  Anytime you have any of the following symptoms: 1) 3 pound weight gain in 24 hours or 5 pounds in 1 week 2) shortness of breath, with or without a dry hacking cough 3) swelling in the hands, feet or stomach 4) if you have to sleep on extra pillows at night in order to breathe.    Complete by:  As directed      Diet - low sodium heart healthy    Complete by:  As directed      Increase activity slowly    Complete by:  As directed             DISPOSITION: home with home health DISCHARGE FOLLOW-UP Follow-up Information    Follow up with [facility].   Why:  they will do your home health care at your home   Contact information:   [address] [place] [address] [phone]       Follow up with Shamleffer, Herschell Dimes, MD. Schedule an appointment as soon as possible for a visit on 05/25/2015.    Specialty:  Internal Medicine   Why:  for hospital follow-up @10 ;00am   Contact information:   [address]  [place] [address] [phone]       Follow up with Peter Martinique, MD. Go on 05/24/2015.   Specialty:  Cardiology   Why:  for hospital follow-up@3 ;30pm [facility] information:   [address] [place] [address] [phone]        Time spent on Discharge: 11mins   Signed:   Jeremias Broyhill M.D. [facility] 05/17/2015, 11:46 AM Pager: [phone]

## 2015-05-17 NOTE — Telephone Encounter (Signed)
TCM Visit .Marland Kitchen Appt is on 05/24/15 at 3:30pm , w/ Kerin Ransom at the [facility] office .Marland Kitchen Thanks

## 2015-05-17 NOTE — Discharge Summary (Signed)
Pt got discharged to home, discharge instructions provided and patient showed understanding to it, IV taken out,Telemonitor DC,pt left unit in wheelchair with all of the belongings accompanied with a family member (Niece) 

## 2015-05-17 NOTE — Care Management Important Message (Signed)
Important Message  Patient Details  Name: [name] MRN: [id number] Date of Birth: January 19, 1917   Medicare Important Message Given:  Yes    [name] [name] 05/17/2015, 1:37 PM

## 2015-05-18 DIAGNOSIS — Z7901 Long term (current) use of anticoagulants: Secondary | ICD-10-CM | POA: Diagnosis not present

## 2015-05-18 DIAGNOSIS — E119 Type 2 diabetes mellitus without complications: Secondary | ICD-10-CM | POA: Diagnosis not present

## 2015-05-18 DIAGNOSIS — I4891 Unspecified atrial fibrillation: Secondary | ICD-10-CM | POA: Diagnosis not present

## 2015-05-18 DIAGNOSIS — I10 Essential (primary) hypertension: Secondary | ICD-10-CM | POA: Diagnosis not present

## 2015-05-18 DIAGNOSIS — K5790 Diverticulosis of intestine, part unspecified, without perforation or abscess without bleeding: Secondary | ICD-10-CM | POA: Diagnosis not present

## 2015-05-18 DIAGNOSIS — E785 Hyperlipidemia, unspecified: Secondary | ICD-10-CM | POA: Diagnosis not present

## 2015-05-18 DIAGNOSIS — I2510 Atherosclerotic heart disease of native coronary artery without angina pectoris: Secondary | ICD-10-CM | POA: Diagnosis not present

## 2015-05-18 DIAGNOSIS — E039 Hypothyroidism, unspecified: Secondary | ICD-10-CM | POA: Diagnosis not present

## 2015-05-18 DIAGNOSIS — I5031 Acute diastolic (congestive) heart failure: Secondary | ICD-10-CM | POA: Diagnosis not present

## 2015-05-18 DIAGNOSIS — M150 Primary generalized (osteo)arthritis: Secondary | ICD-10-CM | POA: Diagnosis not present

## 2015-05-18 NOTE — Telephone Encounter (Signed)
[facility] 05/18/15 2:48pm

## 2015-05-21 DIAGNOSIS — I5031 Acute diastolic (congestive) heart failure: Secondary | ICD-10-CM | POA: Diagnosis not present

## 2015-05-21 DIAGNOSIS — K5790 Diverticulosis of intestine, part unspecified, without perforation or abscess without bleeding: Secondary | ICD-10-CM | POA: Diagnosis not present

## 2015-05-21 DIAGNOSIS — I10 Essential (primary) hypertension: Secondary | ICD-10-CM | POA: Diagnosis not present

## 2015-05-21 DIAGNOSIS — I4891 Unspecified atrial fibrillation: Secondary | ICD-10-CM | POA: Diagnosis not present

## 2015-05-21 DIAGNOSIS — I2510 Atherosclerotic heart disease of native coronary artery without angina pectoris: Secondary | ICD-10-CM | POA: Diagnosis not present

## 2015-05-21 DIAGNOSIS — E119 Type 2 diabetes mellitus without complications: Secondary | ICD-10-CM | POA: Diagnosis not present

## 2015-05-22 ENCOUNTER — Telehealth (HOSPITAL_COMMUNITY): Payer: Self-pay | Admitting: Surgery

## 2015-05-22 NOTE — Telephone Encounter (Signed)
Heart Failure Nurse Navigator Post Discharge Telephone Call  I called to check on [name] after her recent hospitalization.  She says that her breathing is "ok".  She has not been weighing and said she was not aware of the relation of weights to signs and symptoms of HF.  I reviewed the importance of daily weights and how weight gains relate to the signs and symptoms of HF.  She tells me that she does not add salt to food and avoids high sodium foods.  She denies any issues with getting or taking prescribed medications.  Her neighbor was there to assist her and he tells me that they have been checking on her regularly.  She has been anxious to get out and about (she is still driving) and he has been discouraging her due to recent snow and slick roads.  She has a F/U appt with [facility] on 05/23/14.  I encouraged her to call me back with any concerns or questions related to her HF and she took my phone number.

## 2015-05-22 NOTE — Telephone Encounter (Signed)
Patient contacted regarding discharge from blank on blank.   Patient understands to follow up with provider ?On 05/24/15 at 330p at [facility].  Patient understands discharge instructions Patient understands medications and regiment  Patient understands to bring all medications to this visit

## 2015-05-23 DIAGNOSIS — E119 Type 2 diabetes mellitus without complications: Secondary | ICD-10-CM | POA: Diagnosis not present

## 2015-05-23 DIAGNOSIS — I2510 Atherosclerotic heart disease of native coronary artery without angina pectoris: Secondary | ICD-10-CM | POA: Diagnosis not present

## 2015-05-23 DIAGNOSIS — I5031 Acute diastolic (congestive) heart failure: Secondary | ICD-10-CM | POA: Diagnosis not present

## 2015-05-23 DIAGNOSIS — I10 Essential (primary) hypertension: Secondary | ICD-10-CM | POA: Diagnosis not present

## 2015-05-23 DIAGNOSIS — I4891 Unspecified atrial fibrillation: Secondary | ICD-10-CM | POA: Diagnosis not present

## 2015-05-23 DIAGNOSIS — K5790 Diverticulosis of intestine, part unspecified, without perforation or abscess without bleeding: Secondary | ICD-10-CM | POA: Diagnosis not present

## 2015-05-24 ENCOUNTER — Encounter: Payer: Self-pay | Admitting: Cardiology

## 2015-05-24 ENCOUNTER — Ambulatory Visit (INDEPENDENT_AMBULATORY_CARE_PROVIDER_SITE_OTHER): Payer: Medicare Other | Admitting: Cardiology

## 2015-05-24 VITALS — BP 140/78 | HR 65 | Ht 61.0 in | Wt 145.5 lb

## 2015-05-24 DIAGNOSIS — I5031 Acute diastolic (congestive) heart failure: Secondary | ICD-10-CM | POA: Diagnosis not present

## 2015-05-24 DIAGNOSIS — I10 Essential (primary) hypertension: Secondary | ICD-10-CM

## 2015-05-24 DIAGNOSIS — Z950 Presence of cardiac pacemaker: Secondary | ICD-10-CM | POA: Diagnosis not present

## 2015-05-24 DIAGNOSIS — I2510 Atherosclerotic heart disease of native coronary artery without angina pectoris: Secondary | ICD-10-CM | POA: Diagnosis not present

## 2015-05-24 DIAGNOSIS — Z9861 Coronary angioplasty status: Secondary | ICD-10-CM | POA: Diagnosis not present

## 2015-05-24 DIAGNOSIS — Z7901 Long term (current) use of anticoagulants: Secondary | ICD-10-CM

## 2015-05-24 DIAGNOSIS — I482 Chronic atrial fibrillation: Secondary | ICD-10-CM

## 2015-05-24 DIAGNOSIS — E039 Hypothyroidism, unspecified: Secondary | ICD-10-CM

## 2015-05-24 DIAGNOSIS — I4821 Permanent atrial fibrillation: Secondary | ICD-10-CM

## 2015-05-24 NOTE — Assessment & Plan Note (Signed)
RCA stent 2010

## 2015-05-24 NOTE — Assessment & Plan Note (Signed)
Tolerating well, paced rythm

## 2015-05-24 NOTE — Progress Notes (Signed)
05/24/2015 [name]   20-Apr-1917  [id number]  Primary Physician Shamleffer, Herschell Dimes, MD Primary Cardiologist: Dr Martinique  HPI:  Delightful 82 y/o female followed by Dr Martinique. She lives in her own home in [place] and still drives.  She has a history of coronary disease and is status post stenting of the mid right coronary in February 2010 with a bare-metal stent. She had moderate three-vessel disease at that time. She also has a history of AV block with presyncope and has a MDT pacemaker implant in 2010. She has a history of chronic atrial fibrillation managed with rate control and anticoagulation with Eliquis. She was recently admitted to the hospital 05/14/15-05/17/15 with what appears to be acute diastolic CHF. We did not see her in consult. Her adm wgt was 157 lbs- DC wgt 145 lbs. She is in the office today with her daughter for follow up. She has done well since discharge, no unusual dyspnea or edema.    Current Outpatient Prescriptions  Medication Sig Dispense Refill  . apixaban (ELIQUIS) 5 MG TABS tablet Take 1 tablet (5 mg total) by mouth 2 (two) times daily. 60 tablet 5  . furosemide (LASIX) 40 MG tablet Take 1 tablet (40 mg total) by mouth daily. 90 tablet 3  . levothyroxine (SYNTHROID, LEVOTHROID) 75 MCG tablet Take 75 mcg by mouth daily before breakfast.    . losartan (COZAAR) 100 MG tablet TAKE ONE TABLET BY MOUTH ONCE DAILY 30 tablet 5  . nitroGLYCERIN (NITROSTAT) 0.6 MG SL tablet Place 1 tablet (0.6 mg total) under the tongue every 5 (five) minutes as needed for chest pain (MAX 3 TABLETS). 25 tablet 11  . simvastatin (ZOCOR) 40 MG tablet Take 1 tablet (40 mg total) by mouth at bedtime. 90 tablet 3  . [DISCONTINUED] Probiotic Product (ALIGN PO) Take 1 capsule by mouth daily.       No current facility-administered medications for this visit.    Allergies  Allergen Reactions  . Diclofenac Sodium     UNKNOWN  . Hytrin [Terazosin Hcl]     UNKNOWN   .  Ibuprofen     UNKNOWN   . Norvasc [Amlodipine Besylate]     UNKNOWN   . Plavix [Clopidogrel Bisulfate]     UNKNOWN     Social History   Social History  . Marital Status: Widowed    Spouse Name: N/A  . Number of Children: N/A  . Years of Education: N/A   Occupational History  . Not on file.   Social History Main Topics  . Smoking status: Never Smoker   . Smokeless tobacco: Never Used  . Alcohol Use: No  . Drug Use: No  . Sexual Activity: Not on file   Other Topics Concern  . Not on file   Social History Narrative          She is a widow.  She lives by herself.  She did have 2           children who both died at young age.  Both of the children were invalids           from birth because of birth injuries but lived to the ages of 83 and 66           respectivel     Review of Systems: General: negative for chills, fever, night sweats or weight changes.  Cardiovascular: negative for chest pain, dyspnea on exertion, edema, orthopnea, palpitations, paroxysmal nocturnal dyspnea  or shortness of breath Dermatological: negative for rash Respiratory: negative for cough or wheezing Urologic: negative for hematuria Abdominal: negative for nausea, vomiting, diarrhea, bright red blood per rectum, melena, or hematemesis Neurologic: negative for visual changes, syncope, or dizziness All other systems reviewed and are otherwise negative except as noted above.    Blood pressure 140/78, pulse 65, height 5\' 1"  (1.549 m), weight 145 lb 8 oz (65.998 kg).  General appearance: alert, cooperative and no distress Neck: no carotid bruit and no JVD Lungs: clear to auscultation bilaterally Heart: regular rate and rhythm Extremities: no edema Neurologic: Grossly normal  EKG Paced  Echo: 05/14/15 Study Conclusions  - Left ventricle: There is basal inferior and inferoseptal hypokinesis. The cavity size was normal. There was moderate concentric hypertrophy. Systolic function was  normal. The estimated ejection fraction was in the range of 50% to 55%. Wall motion was normal; there were no regional wall motion abnormalities. Doppler parameters are consistent with restrictive physiology, indicative of decreased left ventricular diastolic compliance and/or increased left atrial pressure. - Aortic valve: Trileaflet; normal thickness leaflets. There was no regurgitation. - Aortic root: The aortic root was normal in size. - Mitral valve: Calcified annulus. Mildly thickened leaflets . There was moderate regurgitation. - Left atrium: The atrium was severely dilated. - Right ventricle: The cavity size was moderately dilated. Wall thickness was normal. Systolic function was moderately reduced. - Right atrium: The atrium was mildly dilated. - Tricuspid valve: There was moderate regurgitation. - Pulmonary arteries: Systolic pressure was moderately to severely increased. PA peak pressure: 51 mm Hg (S). - Inferior vena cava: The vessel was dilated. The respirophasic diameter changes were blunted (< 50%), consistent with elevated central venous pressure.    ASSESSMENT AND PLAN:   Acute diastolic CHF (congestive heart failure) (HCC) Recent hospitalization Jan2-Jan 5  CAD S/P percutaneous coronary angioplasty RCA stent 2010  Essential hypertension Controlled  Hypothyroid TSH slightly elevated during her recent hospitalization. No change in her dose made. She is to see her PCP tomorrow.  Permanent atrial fibrillation (HCC) Tolerating well, paced rythm  Chronic anticoagulation Eliquis   PLAN  I'm not sure why she had this recent CHF exacerbation. She is stable today, wgt same as her DC wgt- 145 lbs. I explained that they should call us if there is a 3-5 lb wgt gain. I encouraged her to watch out for high sodium foods.   Erlene Quan PA-C 05/24/2015 4:29 PM

## 2015-05-24 NOTE — Assessment & Plan Note (Signed)
Eliquis 

## 2015-05-24 NOTE — Assessment & Plan Note (Signed)
Controlled.  

## 2015-05-24 NOTE — Assessment & Plan Note (Signed)
Recent hospitalization Jan2-Jan 5

## 2015-05-24 NOTE — Patient Instructions (Signed)
Continue same medications   Your physician wants you to follow-up in: 6 months with Dr.Jordan. You will receive a reminder letter in the mail two months in advance. If you don't receive a letter, please call our office to schedule the follow-up appointment.

## 2015-05-24 NOTE — Assessment & Plan Note (Signed)
TSH slightly elevated during her recent hospitalization. No change in her dose made. She is to see her PCP tomorrow.

## 2015-05-25 DIAGNOSIS — I482 Chronic atrial fibrillation: Secondary | ICD-10-CM | POA: Diagnosis not present

## 2015-05-25 DIAGNOSIS — I5031 Acute diastolic (congestive) heart failure: Secondary | ICD-10-CM | POA: Diagnosis not present

## 2015-05-25 DIAGNOSIS — I5033 Acute on chronic diastolic (congestive) heart failure: Secondary | ICD-10-CM | POA: Diagnosis not present

## 2015-05-25 DIAGNOSIS — E119 Type 2 diabetes mellitus without complications: Secondary | ICD-10-CM | POA: Diagnosis not present

## 2015-05-25 DIAGNOSIS — E039 Hypothyroidism, unspecified: Secondary | ICD-10-CM | POA: Diagnosis not present

## 2015-05-25 DIAGNOSIS — Z09 Encounter for follow-up examination after completed treatment for conditions other than malignant neoplasm: Secondary | ICD-10-CM | POA: Diagnosis not present

## 2015-05-25 DIAGNOSIS — I2510 Atherosclerotic heart disease of native coronary artery without angina pectoris: Secondary | ICD-10-CM | POA: Diagnosis not present

## 2015-05-25 DIAGNOSIS — I4891 Unspecified atrial fibrillation: Secondary | ICD-10-CM | POA: Diagnosis not present

## 2015-05-25 DIAGNOSIS — E1165 Type 2 diabetes mellitus with hyperglycemia: Secondary | ICD-10-CM | POA: Diagnosis not present

## 2015-05-25 DIAGNOSIS — I10 Essential (primary) hypertension: Secondary | ICD-10-CM | POA: Diagnosis not present

## 2015-05-25 DIAGNOSIS — K5790 Diverticulosis of intestine, part unspecified, without perforation or abscess without bleeding: Secondary | ICD-10-CM | POA: Diagnosis not present

## 2015-05-25 DIAGNOSIS — Z7984 Long term (current) use of oral hypoglycemic drugs: Secondary | ICD-10-CM | POA: Diagnosis not present

## 2015-05-28 DIAGNOSIS — E119 Type 2 diabetes mellitus without complications: Secondary | ICD-10-CM | POA: Diagnosis not present

## 2015-05-28 DIAGNOSIS — I4891 Unspecified atrial fibrillation: Secondary | ICD-10-CM | POA: Diagnosis not present

## 2015-05-28 DIAGNOSIS — K5790 Diverticulosis of intestine, part unspecified, without perforation or abscess without bleeding: Secondary | ICD-10-CM | POA: Diagnosis not present

## 2015-05-28 DIAGNOSIS — I2510 Atherosclerotic heart disease of native coronary artery without angina pectoris: Secondary | ICD-10-CM | POA: Diagnosis not present

## 2015-05-28 DIAGNOSIS — I10 Essential (primary) hypertension: Secondary | ICD-10-CM | POA: Diagnosis not present

## 2015-05-28 DIAGNOSIS — I5031 Acute diastolic (congestive) heart failure: Secondary | ICD-10-CM | POA: Diagnosis not present

## 2015-05-29 DIAGNOSIS — I4891 Unspecified atrial fibrillation: Secondary | ICD-10-CM | POA: Diagnosis not present

## 2015-05-29 DIAGNOSIS — I5031 Acute diastolic (congestive) heart failure: Secondary | ICD-10-CM | POA: Diagnosis not present

## 2015-05-29 DIAGNOSIS — K5790 Diverticulosis of intestine, part unspecified, without perforation or abscess without bleeding: Secondary | ICD-10-CM | POA: Diagnosis not present

## 2015-05-29 DIAGNOSIS — I2510 Atherosclerotic heart disease of native coronary artery without angina pectoris: Secondary | ICD-10-CM | POA: Diagnosis not present

## 2015-05-29 DIAGNOSIS — E119 Type 2 diabetes mellitus without complications: Secondary | ICD-10-CM | POA: Diagnosis not present

## 2015-05-29 DIAGNOSIS — I10 Essential (primary) hypertension: Secondary | ICD-10-CM | POA: Diagnosis not present

## 2015-05-31 DIAGNOSIS — I10 Essential (primary) hypertension: Secondary | ICD-10-CM | POA: Diagnosis not present

## 2015-05-31 DIAGNOSIS — K5790 Diverticulosis of intestine, part unspecified, without perforation or abscess without bleeding: Secondary | ICD-10-CM | POA: Diagnosis not present

## 2015-05-31 DIAGNOSIS — I2510 Atherosclerotic heart disease of native coronary artery without angina pectoris: Secondary | ICD-10-CM | POA: Diagnosis not present

## 2015-05-31 DIAGNOSIS — I5031 Acute diastolic (congestive) heart failure: Secondary | ICD-10-CM | POA: Diagnosis not present

## 2015-05-31 DIAGNOSIS — I4891 Unspecified atrial fibrillation: Secondary | ICD-10-CM | POA: Diagnosis not present

## 2015-05-31 DIAGNOSIS — E119 Type 2 diabetes mellitus without complications: Secondary | ICD-10-CM | POA: Diagnosis not present

## 2015-06-05 DIAGNOSIS — K5790 Diverticulosis of intestine, part unspecified, without perforation or abscess without bleeding: Secondary | ICD-10-CM | POA: Diagnosis not present

## 2015-06-05 DIAGNOSIS — E119 Type 2 diabetes mellitus without complications: Secondary | ICD-10-CM | POA: Diagnosis not present

## 2015-06-05 DIAGNOSIS — I2510 Atherosclerotic heart disease of native coronary artery without angina pectoris: Secondary | ICD-10-CM | POA: Diagnosis not present

## 2015-06-05 DIAGNOSIS — I5031 Acute diastolic (congestive) heart failure: Secondary | ICD-10-CM | POA: Diagnosis not present

## 2015-06-05 DIAGNOSIS — I4891 Unspecified atrial fibrillation: Secondary | ICD-10-CM | POA: Diagnosis not present

## 2015-06-05 DIAGNOSIS — I10 Essential (primary) hypertension: Secondary | ICD-10-CM | POA: Diagnosis not present

## 2015-06-07 DIAGNOSIS — K5790 Diverticulosis of intestine, part unspecified, without perforation or abscess without bleeding: Secondary | ICD-10-CM | POA: Diagnosis not present

## 2015-06-07 DIAGNOSIS — E119 Type 2 diabetes mellitus without complications: Secondary | ICD-10-CM | POA: Diagnosis not present

## 2015-06-07 DIAGNOSIS — I5031 Acute diastolic (congestive) heart failure: Secondary | ICD-10-CM | POA: Diagnosis not present

## 2015-06-07 DIAGNOSIS — I4891 Unspecified atrial fibrillation: Secondary | ICD-10-CM | POA: Diagnosis not present

## 2015-06-07 DIAGNOSIS — I2510 Atherosclerotic heart disease of native coronary artery without angina pectoris: Secondary | ICD-10-CM | POA: Diagnosis not present

## 2015-06-07 DIAGNOSIS — I10 Essential (primary) hypertension: Secondary | ICD-10-CM | POA: Diagnosis not present

## 2015-06-11 DIAGNOSIS — I2510 Atherosclerotic heart disease of native coronary artery without angina pectoris: Secondary | ICD-10-CM | POA: Diagnosis not present

## 2015-06-11 DIAGNOSIS — K5790 Diverticulosis of intestine, part unspecified, without perforation or abscess without bleeding: Secondary | ICD-10-CM | POA: Diagnosis not present

## 2015-06-11 DIAGNOSIS — I4891 Unspecified atrial fibrillation: Secondary | ICD-10-CM | POA: Diagnosis not present

## 2015-06-11 DIAGNOSIS — E119 Type 2 diabetes mellitus without complications: Secondary | ICD-10-CM | POA: Diagnosis not present

## 2015-06-11 DIAGNOSIS — I5031 Acute diastolic (congestive) heart failure: Secondary | ICD-10-CM | POA: Diagnosis not present

## 2015-06-11 DIAGNOSIS — I10 Essential (primary) hypertension: Secondary | ICD-10-CM | POA: Diagnosis not present

## 2015-06-13 DIAGNOSIS — I2510 Atherosclerotic heart disease of native coronary artery without angina pectoris: Secondary | ICD-10-CM | POA: Diagnosis not present

## 2015-06-13 DIAGNOSIS — I10 Essential (primary) hypertension: Secondary | ICD-10-CM | POA: Diagnosis not present

## 2015-06-13 DIAGNOSIS — K5790 Diverticulosis of intestine, part unspecified, without perforation or abscess without bleeding: Secondary | ICD-10-CM | POA: Diagnosis not present

## 2015-06-13 DIAGNOSIS — I5031 Acute diastolic (congestive) heart failure: Secondary | ICD-10-CM | POA: Diagnosis not present

## 2015-06-13 DIAGNOSIS — E119 Type 2 diabetes mellitus without complications: Secondary | ICD-10-CM | POA: Diagnosis not present

## 2015-06-13 DIAGNOSIS — I4891 Unspecified atrial fibrillation: Secondary | ICD-10-CM | POA: Diagnosis not present

## 2015-06-14 DIAGNOSIS — E119 Type 2 diabetes mellitus without complications: Secondary | ICD-10-CM | POA: Diagnosis not present

## 2015-06-14 DIAGNOSIS — I10 Essential (primary) hypertension: Secondary | ICD-10-CM | POA: Diagnosis not present

## 2015-06-14 DIAGNOSIS — I4891 Unspecified atrial fibrillation: Secondary | ICD-10-CM | POA: Diagnosis not present

## 2015-06-14 DIAGNOSIS — K5790 Diverticulosis of intestine, part unspecified, without perforation or abscess without bleeding: Secondary | ICD-10-CM | POA: Diagnosis not present

## 2015-06-14 DIAGNOSIS — I5031 Acute diastolic (congestive) heart failure: Secondary | ICD-10-CM | POA: Diagnosis not present

## 2015-06-14 DIAGNOSIS — I2510 Atherosclerotic heart disease of native coronary artery without angina pectoris: Secondary | ICD-10-CM | POA: Diagnosis not present

## 2015-07-31 DIAGNOSIS — Z7984 Long term (current) use of oral hypoglycemic drugs: Secondary | ICD-10-CM | POA: Diagnosis not present

## 2015-07-31 DIAGNOSIS — E785 Hyperlipidemia, unspecified: Secondary | ICD-10-CM | POA: Diagnosis not present

## 2015-07-31 DIAGNOSIS — I5032 Chronic diastolic (congestive) heart failure: Secondary | ICD-10-CM | POA: Diagnosis not present

## 2015-07-31 DIAGNOSIS — I10 Essential (primary) hypertension: Secondary | ICD-10-CM | POA: Diagnosis not present

## 2015-07-31 DIAGNOSIS — E1165 Type 2 diabetes mellitus with hyperglycemia: Secondary | ICD-10-CM | POA: Diagnosis not present

## 2015-09-21 ENCOUNTER — Other Ambulatory Visit: Payer: Self-pay | Admitting: Cardiology

## 2015-10-09 ENCOUNTER — Encounter: Payer: Medicare Other | Admitting: Physician Assistant

## 2015-10-10 ENCOUNTER — Encounter: Payer: Self-pay | Admitting: Physician Assistant

## 2015-10-10 ENCOUNTER — Ambulatory Visit (INDEPENDENT_AMBULATORY_CARE_PROVIDER_SITE_OTHER): Payer: Medicare Other | Admitting: Physician Assistant

## 2015-10-10 VITALS — BP 118/68 | HR 79 | Ht 61.0 in | Wt 155.0 lb

## 2015-10-10 DIAGNOSIS — I509 Heart failure, unspecified: Secondary | ICD-10-CM | POA: Diagnosis not present

## 2015-10-10 DIAGNOSIS — I4821 Permanent atrial fibrillation: Secondary | ICD-10-CM

## 2015-10-10 DIAGNOSIS — Z9861 Coronary angioplasty status: Secondary | ICD-10-CM

## 2015-10-10 DIAGNOSIS — I5031 Acute diastolic (congestive) heart failure: Secondary | ICD-10-CM | POA: Diagnosis not present

## 2015-10-10 DIAGNOSIS — I2510 Atherosclerotic heart disease of native coronary artery without angina pectoris: Secondary | ICD-10-CM

## 2015-10-10 DIAGNOSIS — I482 Chronic atrial fibrillation: Secondary | ICD-10-CM

## 2015-10-10 DIAGNOSIS — I10 Essential (primary) hypertension: Secondary | ICD-10-CM

## 2015-10-10 DIAGNOSIS — Z950 Presence of cardiac pacemaker: Secondary | ICD-10-CM | POA: Diagnosis not present

## 2015-10-10 LAB — BASIC METABOLIC PANEL
BUN: 18 mg/dL (ref 7–25)
CALCIUM: 9.2 mg/dL (ref 8.6–10.4)
CHLORIDE: 101 mmol/L (ref 98–110)
CO2: 26 mmol/L (ref 20–31)
Creat: 1.01 mg/dL — ABNORMAL HIGH (ref 0.60–0.88)
Glucose, Bld: 235 mg/dL — ABNORMAL HIGH (ref 65–99)
POTASSIUM: 4 mmol/L (ref 3.5–5.3)
SODIUM: 140 mmol/L (ref 135–146)

## 2015-10-10 LAB — CBC
HEMATOCRIT: 38.6 % (ref 35.0–45.0)
HEMOGLOBIN: 12.9 g/dL (ref 11.7–15.5)
MCH: 31.2 pg (ref 27.0–33.0)
MCHC: 33.4 g/dL (ref 32.0–36.0)
MCV: 93.5 fL (ref 80.0–100.0)
MPV: 10.5 fL (ref 7.5–12.5)
Platelets: 279 10*3/uL (ref 140–400)
RBC: 4.13 MIL/uL (ref 3.80–5.10)
RDW: 12.9 % (ref 11.0–15.0)
WBC: 8.6 10*3/uL (ref 3.8–10.8)

## 2015-10-10 NOTE — Patient Instructions (Signed)
Medication Instructions:   Your physician recommends that you continue on your current medications as directed. Please refer to the Current Medication list given to you today.   If you need a refill on your cardiac medications before your next appointment, please call your pharmacy.  Labwork:   BMET AND CBC    Testing/Procedures:  NONE ORDER TODAY    Follow-Up  6 MONTHS WITH DEVICE CLINIC :     Your physician wants you to follow-up in: [place] will receive a reminder letter in the mail two months in advance. If you don't receive a letter, please call our office to schedule the follow-up appointment.     Any Other Special Instructions Will Be Listed Below (If Applicable).

## 2015-10-10 NOTE — Progress Notes (Signed)
Cardiology Office Note Date:  10/10/2015  Patient ID:  [name], [name] 25-Apr-1917, MRN [id number] PCP:  Lottie Dawson, MD  Cardiologist:  Dr. Martinique Electrophysiologist: Dr. Rayann Heman   Chief Complaint: routine EP/pacer visit  History of Present Illness: [name] is a 82 y.o. female with history of permanent AFib, AVblock with PPM hypothyroidism, HLD, DM, HTN, DJD, CAD with PCI to RCA in 2010.  She had a hospital stay in Jan for CHF (diastolic).   She was seen by cardiology Kerin Ransom, PA afterwards doing well post hospital.  The patient comes today to be seen for Dr. Rayann Heman, last seen by him a year ago and doing well.  She states today she is feeling very well, denies any kind of CP, palpitations or SOB, denies symptoms of orthopnea or PND, no exertional intolerances, states she keeps active by picking up small yard debris and likes to be outside.  No palpitations, no near syncope or syncope.  She is p 10 lbs from Jan, denies any edema, bloating,  Signs of fluid OL.  She denies any bleeding or signs of bleeding.   Past Medical History  Diagnosis Date  . Colitis   . Colon cancer ([place])   . Hypothyroidism   . Ventral hernia   . Mobitz (type) II atrioventricular block   . CAD (coronary artery disease)   . Diverticulosis of colon   . HLD (hyperlipidemia)   . DJD (degenerative joint disease)   . DM (diabetes mellitus) ([place])   . HTN (hypertension)   . Atrial fibrillation [facility])     Past Surgical History  Procedure Laterality Date  . Appendectomy    . Hemicolectomy  10/07    right  . Pacemaker insertion  07/06/08    MDT [id number] implanted for Mobitz II heart block by Dr Rayann Heman  . Coronary angioplasty  07/03/2008  . Cardiac catheterization  08/22/1997  . Rotator cuff repair    . Transthoracic echocardiogram  06/29/2008    EF 55-60%    Current Outpatient Prescriptions  Medication Sig Dispense Refill  . apixaban (ELIQUIS) 5 MG TABS tablet Take 1 tablet (5 mg  total) by mouth 2 (two) times daily. 60 tablet 5  . furosemide (LASIX) 40 MG tablet Take 1 tablet (40 mg total) by mouth daily. 90 tablet 3  . levothyroxine (SYNTHROID, LEVOTHROID) 75 MCG tablet Take 75 mcg by mouth daily before breakfast.    . losartan (COZAAR) 100 MG tablet TAKE ONE TABLET BY MOUTH ONCE DAILY 30 tablet 6  . nitroGLYCERIN (NITROSTAT) 0.6 MG SL tablet Place 1 tablet (0.6 mg total) under the tongue every 5 (five) minutes as needed for chest pain (MAX 3 TABLETS). 25 tablet 11  . simvastatin (ZOCOR) 40 MG tablet Take 1 tablet (40 mg total) by mouth at bedtime. 90 tablet 3  . [DISCONTINUED] Probiotic Product (ALIGN PO) Take 1 capsule by mouth daily.       No current facility-administered medications for this visit.    Allergies:   Diclofenac sodium; Hytrin; Ibuprofen; Norvasc; and Plavix   Social History:  The patient  reports that she has never smoked. She has never used smokeless tobacco. She reports that she does not drink alcohol or use illicit drugs.   Family History:  The patient's family history includes Heart attack in her father.   ROS:  Please see the history of present illness.   All other systems are reviewed and otherwise negative.   PHYSICAL EXAM:  VS:  BP 118/68 mmHg  Pulse 79  Ht 5\' 1"  (1.549 m)  Wt 155 lb (70.308 kg)  BMI 29.30 kg/m2 BMI: Body mass index is 29.3 kg/(m^2). Well nourished, well developed, in no acute distress HEENT: normocephalic, atraumatic Neck: no JVD, carotid bruits or masses Cardiac:  normal S1, S2; RRR; no significant murmurs, no rubs, or gallops Lungs:  clear to auscultation bilaterally, no wheezing, rhonchi or rales Abd: soft, nontender MS: no deformity or atrophy Ext: no edema Skin: warm and dry, no rash Neuro:  No gross deficits appreciated Psych: euthymic mood, full affect  PPM site is stable, no tethering or discomfort   EKG:  Done today shows and reviewed by myself, AFib, V paced PPM check today shows normal function  battery status is good  05/14/15: Echocardiogram Study Conclusions - Left ventricle: There is basal inferior and inferoseptal  hypokinesis. The cavity size was normal. There was moderate  concentric hypertrophy. Systolic function was normal. The  estimated ejection fraction was in the range of 50% to 55%. Wall  motion was normal; there were no regional wall motion  abnormalities. Doppler parameters are consistent with restrictive  physiology, indicative of decreased left ventricular diastolic  compliance and/or increased left atrial pressure. - Aortic valve: Trileaflet; normal thickness leaflets. There was no  regurgitation. - Aortic root: The aortic root was normal in size. - Mitral valve: Calcified annulus. Mildly thickened leaflets .  There was moderate regurgitation. - Left atrium: The atrium was severely dilated. - Right ventricle: The cavity size was moderately dilated. Wall  thickness was normal. Systolic function was moderately reduced. - Right atrium: The atrium was mildly dilated. - Tricuspid valve: There was moderate regurgitation. - Pulmonary arteries: Systolic pressure was moderately to severely  increased. PA peak pressure: 51 mm Hg (S). - Inferior vena cava: The vessel was dilated. The respirophasic  diameter changes were blunted (< 50%), consistent with elevated  central venous pressure.  Recent Labs: 05/14/2015: B Natriuretic Peptide 455.4*; Magnesium 1.5* 05/15/2015: ALT 51; Hemoglobin 11.8*; Platelets 397 05/16/2015: TSH 8.303* 05/17/2015: BUN 14; Creatinine, Ser 0.84; Potassium 3.7; Sodium 141  No results found for requested labs within last 365 days.   CrCl cannot be calculated (Patient has no serum creatinine result on file.).   Wt Readings from Last 3 Encounters:  10/10/15 155 lb (70.308 kg)  05/24/15 145 lb 8 oz (65.998 kg)  05/17/15 145 lb 4.8 oz (65.908 kg)     Other studies reviewed: Additional studies/records reviewed today include:  summarized above  DEVICE information: MDT dual chamber PPM, implanted 07/06/08, Dr. Rayann Heman  ASSESSMENT AND PLAN:  1. PPM     functioning normally     Does not do remote monitoring  2. Permanent AFib     CHA2DS2Vasc is at least 5 on Eliquis     Jan 2017, Creat 0.84, H/H 11/35     no reports of bleeding, signs of bleeding  3. HTN     Controlled, monitor  4. CAD     no anginal c/o     Dr. Martinique  5. CHF (diastolic)     Weight is up, no symptoms and exam is euvolemic, no JVD or edema, lungs are clear      Counseled on monitoring weight, minimizing sodium  Disposition: Will check a BMET and H/H given her Eliquis, f/u with device clinic in 6 mo for pacer check, 1 year with Dr. Rayann Heman, and Dr. Martinique as directed by him.  Current medicines are reviewed at length  with the patient today.  The patient did not have any concerns regarding medicines.  Haywood Lasso, PA-C 10/10/2015 2:09 PM     [address] [place] [address] [phone] (office)  [phone] (fax)

## 2015-10-11 ENCOUNTER — Telehealth: Payer: Self-pay | Admitting: *Deleted

## 2015-10-11 NOTE — Telephone Encounter (Signed)
-----   Message from [facility], [place] sent at 10/11/2015  8:54 AM EDT ----- Please let the patient know her labs ar OK, kidney function, potassium, and blood counts are good.  Keep an eye on her diet/diabetes continue to follow DM with her primary MD.   Thanks, Joseph Art

## 2015-11-23 ENCOUNTER — Ambulatory Visit (INDEPENDENT_AMBULATORY_CARE_PROVIDER_SITE_OTHER): Payer: Medicare Other | Admitting: Cardiology

## 2015-11-23 ENCOUNTER — Encounter: Payer: Self-pay | Admitting: Cardiology

## 2015-11-23 VITALS — BP 146/68 | HR 86 | Ht 61.0 in | Wt 155.0 lb

## 2015-11-23 DIAGNOSIS — I482 Chronic atrial fibrillation: Secondary | ICD-10-CM

## 2015-11-23 DIAGNOSIS — I4821 Permanent atrial fibrillation: Secondary | ICD-10-CM

## 2015-11-23 DIAGNOSIS — I10 Essential (primary) hypertension: Secondary | ICD-10-CM

## 2015-11-23 DIAGNOSIS — Z7901 Long term (current) use of anticoagulants: Secondary | ICD-10-CM | POA: Diagnosis not present

## 2015-11-23 DIAGNOSIS — I2510 Atherosclerotic heart disease of native coronary artery without angina pectoris: Secondary | ICD-10-CM | POA: Diagnosis not present

## 2015-11-23 DIAGNOSIS — Z9861 Coronary angioplasty status: Secondary | ICD-10-CM

## 2015-11-23 NOTE — Progress Notes (Signed)
[name] Date of Birth: February 05, 1917   History of Present Illness: [name] is seen for followup CAD and Afib. She is  82 years old. She has a history of coronary disease and is status post stenting of the mid right coronary in February 2010 with a bare-metal stent. She had moderate three-vessel disease at that time. She also has a history of AV block with presyncope and has had previous pacemaker implant. She has a history of chronic atrial fibrillation managed with rate control and anticoagulation. She has been on Eliquis.   She was admitted to the hospital in January 2017 with acute diastolic CHF. She was diuresed with good response. Echo showed EF 50-55% with moderate LVH and restrictive physiology. Moderate MR and TR and biatrial enlargement. Moderate pulmonary HTN.  On follow up today she is seen with her niece.  She denies any chest pain or shortness of breath. She's had no dizziness or syncope. No edema.  She walks with a cane. She does note some memory loss. She does continue to drive.   Current Outpatient Prescriptions on File Prior to Visit  Medication Sig Dispense Refill  . apixaban (ELIQUIS) 5 MG TABS tablet Take 1 tablet (5 mg total) by mouth 2 (two) times daily. 60 tablet 5  . furosemide (LASIX) 40 MG tablet Take 1 tablet (40 mg total) by mouth daily. 90 tablet 3  . levothyroxine (SYNTHROID, LEVOTHROID) 75 MCG tablet Take 75 mcg by mouth daily before breakfast.    . losartan (COZAAR) 100 MG tablet TAKE ONE TABLET BY MOUTH ONCE DAILY 30 tablet 6  . nitroGLYCERIN (NITROSTAT) 0.6 MG SL tablet Place 1 tablet (0.6 mg total) under the tongue every 5 (five) minutes as needed for chest pain (MAX 3 TABLETS). 25 tablet 11  . simvastatin (ZOCOR) 40 MG tablet Take 1 tablet (40 mg total) by mouth at bedtime. 90 tablet 3  . [DISCONTINUED] Probiotic Product (ALIGN PO) Take 1 capsule by mouth daily.       No current facility-administered medications on file prior to visit.     Allergies  Allergen Reactions  . Diclofenac Sodium     UNKNOWN  . Hytrin [Terazosin Hcl]     UNKNOWN   . Ibuprofen     UNKNOWN   . Norvasc [Amlodipine Besylate]     UNKNOWN   . Plavix [Clopidogrel Bisulfate]     UNKNOWN     Past Medical History  Diagnosis Date  . Colitis   . Colon cancer ([place])   . Hypothyroidism   . Ventral hernia   . Mobitz (type) II atrioventricular block   . CAD (coronary artery disease)   . Diverticulosis of colon   . HLD (hyperlipidemia)   . DJD (degenerative joint disease)   . DM (diabetes mellitus) ([place])   . HTN (hypertension)   . Atrial fibrillation [facility])     Past Surgical History  Procedure Laterality Date  . Appendectomy    . Hemicolectomy  10/07    right  . Pacemaker insertion  07/06/08    MDT [id number] implanted for Mobitz II heart block by Dr Rayann Heman  . Coronary angioplasty  07/03/2008  . Cardiac catheterization  08/22/1997  . Rotator cuff repair    . Transthoracic echocardiogram  06/29/2008    EF 55-60%    History  Smoking status  . Never Smoker   Smokeless tobacco  . Never Used    History  Alcohol Use No    Family History  Problem Relation Age of Onset  . Heart attack Father     Review of Systems:  as noted in history of present illness.  All other systems were reviewed and are negative.  Physical Exam: BP 146/68 mmHg  Pulse 86  Ht 5\' 1"  (1.549 m)  Wt 155 lb (70.308 kg)  BMI 29.30 kg/m2 She is an elderly white female in no acute distress. She uses a cane to walk. She is normocephalic, atraumatic. Pupils are equal round and reactive. Sclera clear. Oropharynx is clear. Neck is supple no JVD, adenopathy, thyromegaly, or bruits. Lungs are clear. Cardiac exam reveals a grade 1/6 systolic murmur in the right upper sternal border. Abdomen is obese, soft, nontender. She has a ventral hernia. There is no organomegaly. She has  trace  pretibial edema. Pedal pulses are good. Skin is warm and dry. She is alert and oriented x3.  Cranial nerves II through XII are intact.  LABORATORY DATA: Lab Results  Component Value Date   WBC 8.6 10/10/2015   HGB 12.9 10/10/2015   HCT 38.6 10/10/2015   PLT 279 10/10/2015   GLUCOSE 235* 10/10/2015   CHOL  06/28/2008    156        ATP III CLASSIFICATION:  <200     mg/dL   Desirable  200-239  mg/dL   Borderline High  >=240    mg/dL   High          TRIG 160* 06/28/2008   HDL 30* 06/28/2008   LDLCALC  06/28/2008    94        Total Cholesterol/HDL:CHD Risk Coronary Heart Disease Risk Table                     Men   Women  1/2 Average Risk   3.4   3.3  Average Risk       5.0   4.4  2 X Average Risk   9.6   7.1  3 X Average Risk  23.4   11.0        Use the calculated Patient Ratio above and the CHD Risk Table to determine the patient's CHD Risk.        ATP III CLASSIFICATION (LDL):  <100     mg/dL   Optimal  100-129  mg/dL   Near or Above                    Optimal  130-159  mg/dL   Borderline  160-189  mg/dL   High  >190     mg/dL   Very High   ALT 51 05/15/2015   AST 58* 05/15/2015   NA 140 10/10/2015   K 4.0 10/10/2015   CL 101 10/10/2015   CREATININE 1.01* 10/10/2015   BUN 18 10/10/2015   CO2 26 10/10/2015   TSH 8.303* 05/16/2015   INR 1.33 05/14/2015   HGBA1C 8.4* 05/14/2015     Assessment / Plan: 1. Coronary disease status post stenting of the mid RCA in February 2010 with a bare-metal stent. Has moderate three-vessel disease. She is asymptomatic. Not on ASA due to Eliquis. Continue statin Rx.  2. Complete heart block status post pacemaker implant. Now programmed to VVI mode due to chronic Afib. followed in pacer clinic.  3. Hypertension, controlled. Continue losartan.  4. Atrial fibrillation. Rate controlled. On Eliquis.   5. Chronic diastolic CHF. Appears to be well compensated. Instructed to weigh daily and watch salt. Continue current therapy.  I will follow up in 6 months.

## 2015-11-23 NOTE — Patient Instructions (Addendum)
Continue your current therapy  Weigh yourself daily. If you notice increased weight or swelling you can take an extra lasix.  I will see you in 6 months.

## 2015-11-26 DIAGNOSIS — E119 Type 2 diabetes mellitus without complications: Secondary | ICD-10-CM | POA: Diagnosis not present

## 2016-03-05 ENCOUNTER — Other Ambulatory Visit: Payer: Self-pay | Admitting: Cardiology

## 2016-04-10 ENCOUNTER — Ambulatory Visit (INDEPENDENT_AMBULATORY_CARE_PROVIDER_SITE_OTHER): Payer: Medicare Other | Admitting: *Deleted

## 2016-04-10 DIAGNOSIS — I482 Chronic atrial fibrillation: Secondary | ICD-10-CM

## 2016-04-10 DIAGNOSIS — I4821 Permanent atrial fibrillation: Secondary | ICD-10-CM

## 2016-04-15 LAB — CUP PACEART INCLINIC DEVICE CHECK
Battery Remaining Longevity: 29 mo
Date Time Interrogation Session: 20171130201122
Implantable Lead Implant Date: 20100225
Implantable Lead Location: 753859
Implantable Pulse Generator Implant Date: 20100225
Lead Channel Pacing Threshold Amplitude: 1.125 V
Lead Channel Pacing Threshold Pulse Width: 0.4 ms
Lead Channel Sensing Intrinsic Amplitude: 15.67 mV
Lead Channel Setting Pacing Pulse Width: 0.4 ms
Lead Channel Setting Sensing Sensitivity: 4 mV
MDC IDC LEAD IMPLANT DT: 20100225
MDC IDC LEAD LOCATION: 753860
MDC IDC MSMT BATTERY IMPEDANCE: 2155 Ohm
MDC IDC MSMT BATTERY VOLTAGE: 2.75 V
MDC IDC MSMT LEADCHNL RA IMPEDANCE VALUE: 67 Ohm
MDC IDC MSMT LEADCHNL RV IMPEDANCE VALUE: 521 Ohm
MDC IDC MSMT LEADCHNL RV PACING THRESHOLD AMPLITUDE: 1 V
MDC IDC MSMT LEADCHNL RV PACING THRESHOLD PULSEWIDTH: 0.4 ms
MDC IDC SET LEADCHNL RV PACING AMPLITUDE: 2.5 V
MDC IDC STAT BRADY RV PERCENT PACED: 99 %

## 2016-04-15 NOTE — Progress Notes (Signed)
Pacemaker check in clinic. Normal device function. Threshold, sensing, and impedance consistent with previous measurements. Device programmed to maximize longevity. No high ventricular rates noted. Device programmed at appropriate safety margins. Histogram distribution appropriate for patient activity level. Device programmed to optimize intrinsic conduction. Estimated longevity 29 months (range: 10-36months). Patient will follow up with JA in 6 months.

## 2016-05-25 NOTE — Progress Notes (Deleted)
[name] Date of Birth: 1916-08-18   History of Present Illness: [name] is seen for followup CAD and Afib. She is  83 years old. She has a history of coronary disease and is status post stenting of the mid right coronary in February 2010 with a bare-metal stent. She had moderate three-vessel disease at that time. She also has a history of AV block with presyncope and has had previous pacemaker implant. She has a history of chronic atrial fibrillation managed with rate control and anticoagulation. She has been on Eliquis.   She was admitted to the hospital in January 2017 with acute diastolic CHF. She was diuresed with good response. Echo showed EF 50-55% with moderate LVH and restrictive physiology. Moderate MR and TR and biatrial enlargement. Moderate pulmonary HTN.  On follow up today she is seen with her niece.  She denies any chest pain or shortness of breath. She's had no dizziness or syncope. No edema.  She walks with a cane. She does note some memory loss. She does continue to drive.   Current Outpatient Prescriptions on File Prior to Visit  Medication Sig Dispense Refill  . ELIQUIS 5 MG TABS tablet TAKE ONE TABLET BY MOUTH TWICE DAILY 60 tablet 5  . furosemide (LASIX) 40 MG tablet Take 1 tablet (40 mg total) by mouth daily. 90 tablet 3  . levothyroxine (SYNTHROID, LEVOTHROID) 75 MCG tablet Take 75 mcg by mouth daily before breakfast.    . losartan (COZAAR) 100 MG tablet TAKE ONE TABLET BY MOUTH ONCE DAILY 30 tablet 6  . nitroGLYCERIN (NITROSTAT) 0.6 MG SL tablet Place 1 tablet (0.6 mg total) under the tongue every 5 (five) minutes as needed for chest pain (MAX 3 TABLETS). 25 tablet 11  . simvastatin (ZOCOR) 40 MG tablet Take 1 tablet (40 mg total) by mouth at bedtime. 90 tablet 3  . [DISCONTINUED] Probiotic Product (ALIGN PO) Take 1 capsule by mouth daily.       No current facility-administered medications on file prior to visit.     Allergies  Allergen Reactions  .  Diclofenac Sodium     UNKNOWN  . Hytrin [Terazosin Hcl]     UNKNOWN   . Ibuprofen     UNKNOWN   . Norvasc [Amlodipine Besylate]     UNKNOWN   . Plavix [Clopidogrel Bisulfate]     UNKNOWN     Past Medical History:  Diagnosis Date  . Atrial fibrillation ([place])   . CAD (coronary artery disease)   . Colitis   . Colon cancer ([place])   . Diverticulosis of colon   . DJD (degenerative joint disease)   . DM (diabetes mellitus) ([place])   . HLD (hyperlipidemia)   . HTN (hypertension)   . Hypothyroidism   . Mobitz (type) II atrioventricular block   . Ventral hernia     Past Surgical History:  Procedure Laterality Date  . APPENDECTOMY    . CARDIAC CATHETERIZATION  08/22/1997  . CORONARY ANGIOPLASTY  07/03/2008  . HEMICOLECTOMY  10/07   right  . PACEMAKER INSERTION  07/06/08   MDT [id number] implanted for Mobitz II heart block by Dr Rayann Heman  . ROTATOR CUFF REPAIR    . TRANSTHORACIC ECHOCARDIOGRAM  06/29/2008   EF 55-60%    History  Smoking Status  . Never Smoker  Smokeless Tobacco  . Never Used    History  Alcohol Use No    Family History  Problem Relation Age of Onset  . Heart attack  Father     Review of Systems:  as noted in history of present illness.  All other systems were reviewed and are negative.  Physical Exam: There were no vitals taken for this visit. She is an elderly white female in no acute distress. She uses a cane to walk. She is normocephalic, atraumatic. Pupils are equal round and reactive. Sclera clear. Oropharynx is clear. Neck is supple no JVD, adenopathy, thyromegaly, or bruits. Lungs are clear. Cardiac exam reveals a grade 1/6 systolic murmur in the right upper sternal border. Abdomen is obese, soft, nontender. She has a ventral hernia. There is no organomegaly. She has  trace  pretibial edema. Pedal pulses are good. Skin is warm and dry. She is alert and oriented x3. Cranial nerves II through XII are intact.  LABORATORY DATA: Lab Results  Component  Value Date   WBC 8.6 10/10/2015   HGB 12.9 10/10/2015   HCT 38.6 10/10/2015   PLT 279 10/10/2015   GLUCOSE 235 (H) 10/10/2015   CHOL  06/28/2008    156        ATP III CLASSIFICATION:  <200     mg/dL   Desirable  200-239  mg/dL   Borderline High  >=240    mg/dL   High          TRIG 160 (H) 06/28/2008   HDL 30 (L) 06/28/2008   LDLCALC  06/28/2008    94        Total Cholesterol/HDL:CHD Risk Coronary Heart Disease Risk Table                     Men   Women  1/2 Average Risk   3.4   3.3  Average Risk       5.0   4.4  2 X Average Risk   9.6   7.1  3 X Average Risk  23.4   11.0        Use the calculated Patient Ratio above and the CHD Risk Table to determine the patient's CHD Risk.        ATP III CLASSIFICATION (LDL):  <100     mg/dL   Optimal  100-129  mg/dL   Near or Above                    Optimal  130-159  mg/dL   Borderline  160-189  mg/dL   High  >190     mg/dL   Very High   ALT 51 05/15/2015   AST 58 (H) 05/15/2015   NA 140 10/10/2015   K 4.0 10/10/2015   CL 101 10/10/2015   CREATININE 1.01 (H) 10/10/2015   BUN 18 10/10/2015   CO2 26 10/10/2015   TSH 8.303 (H) 05/16/2015   INR 1.33 05/14/2015   HGBA1C 8.4 (H) 05/14/2015     Assessment / Plan: 1. Coronary disease status post stenting of the mid RCA in February 2010 with a bare-metal stent. Has moderate three-vessel disease. She is asymptomatic. Not on ASA due to Eliquis. Continue statin Rx.  2. Complete heart block status post pacemaker implant. Now programmed to VVI mode due to chronic Afib. followed in pacer clinic.  3. Hypertension, controlled. Continue losartan.  4. Atrial fibrillation. Rate controlled. On Eliquis.   5. Chronic diastolic CHF. Appears to be well compensated. Instructed to weigh daily and watch salt. Continue current therapy.   I will follow up in 6 months.

## 2016-05-29 ENCOUNTER — Ambulatory Visit: Payer: Medicare Other | Admitting: Cardiology

## 2016-06-13 ENCOUNTER — Emergency Department (HOSPITAL_COMMUNITY): Payer: Medicare Other

## 2016-06-13 ENCOUNTER — Encounter (HOSPITAL_COMMUNITY): Payer: Self-pay

## 2016-06-13 ENCOUNTER — Emergency Department (HOSPITAL_COMMUNITY)
Admission: EM | Admit: 2016-06-13 | Discharge: 2016-06-13 | Disposition: A | Discharge status: Home / Self Care | Payer: Medicare Other | Attending: Emergency Medicine | Admitting: Emergency Medicine

## 2016-06-13 DIAGNOSIS — M25562 Pain in left knee: Secondary | ICD-10-CM

## 2016-06-13 DIAGNOSIS — I110 Hypertensive heart disease with heart failure: Secondary | ICD-10-CM | POA: Diagnosis not present

## 2016-06-13 DIAGNOSIS — E119 Type 2 diabetes mellitus without complications: Secondary | ICD-10-CM | POA: Diagnosis not present

## 2016-06-13 DIAGNOSIS — I5032 Chronic diastolic (congestive) heart failure: Secondary | ICD-10-CM | POA: Insufficient documentation

## 2016-06-13 DIAGNOSIS — M1712 Unilateral primary osteoarthritis, left knee: Secondary | ICD-10-CM

## 2016-06-13 DIAGNOSIS — Z85038 Personal history of other malignant neoplasm of large intestine: Secondary | ICD-10-CM | POA: Diagnosis not present

## 2016-06-13 DIAGNOSIS — E039 Hypothyroidism, unspecified: Secondary | ICD-10-CM | POA: Diagnosis not present

## 2016-06-13 DIAGNOSIS — Z7901 Long term (current) use of anticoagulants: Secondary | ICD-10-CM | POA: Diagnosis not present

## 2016-06-13 DIAGNOSIS — Z950 Presence of cardiac pacemaker: Secondary | ICD-10-CM | POA: Insufficient documentation

## 2016-06-13 DIAGNOSIS — Z951 Presence of aortocoronary bypass graft: Secondary | ICD-10-CM | POA: Diagnosis not present

## 2016-06-13 DIAGNOSIS — I2510 Atherosclerotic heart disease of native coronary artery without angina pectoris: Secondary | ICD-10-CM | POA: Diagnosis not present

## 2016-06-13 MED ORDER — TRAMADOL HCL 50 MG PO TABS
50.0000 mg | ORAL_TABLET | Freq: Once | ORAL | Status: AC
  Administered 2016-06-13: 50 mg via ORAL
  Filled 2016-06-13: qty 1

## 2016-06-13 MED ORDER — TRAMADOL HCL 50 MG PO TABS: 50.0000 mg | ORAL_TABLET | Freq: Four times a day (QID) | ORAL | 0 refills | Status: DC | PRN

## 2016-06-13 MED ORDER — ACETAMINOPHEN 500 MG PO TABS: 1000.0000 mg | ORAL_TABLET | Freq: Once | ORAL | Status: DC

## 2016-06-13 MED ORDER — ACETAMINOPHEN 325 MG PO TABS
650.0000 mg | ORAL_TABLET | Freq: Once | ORAL | Status: AC
  Administered 2016-06-13: 650 mg via ORAL
  Filled 2016-06-13: qty 2

## 2016-06-13 MED ORDER — HYDROCODONE-ACETAMINOPHEN 5-325 MG PO TABS: 0.5000 | ORAL_TABLET | Freq: Once | ORAL | Status: DC

## 2016-06-13 NOTE — ED Triage Notes (Signed)
Pt went to sit down today.  Felt pain in her left knee.  Since then she can't walk.  Denies injury or fall.

## 2016-06-13 NOTE — ED Notes (Signed)
Patient ambulated 64 feet with the assistance of a walker.

## 2016-06-13 NOTE — Discharge Instructions (Signed)
Your x-ray shows advanced arthritis of the left knee.  Take tramadol and tylenol as needed for pain.  Return for worsening symptoms including fever, escalating pain, falls, inability to walk, or any other symptoms concerning to you.

## 2016-06-13 NOTE — ED Provider Notes (Signed)
[name] DEPT Provider Note   CSN: [id number] Arrival date & time: 06/13/16  1418     History   Chief Complaint Chief Complaint  Patient presents with  . Knee Pain    HPI [name] is a 83 y.o. female.  HPI 83 year old female who presents with left knee pain. She has a history of hypertension, hyperlipidemia, coronary artery disease, diabetes, atrial fibrillation on Eliquis. Lives at home by herself, normally ambulatory with walker or cane. Has been in her usual state of health. Was with her niece on today and sitting on a bench at [facility] Subsequently unable to stand up by herself due to left knee pain. Did not have fall or injury. Denies swelling or skin changes. Required wheelchair to get to car and couldn't get out of the car at her home due to knee pain. She is otherwise been in her usual state of health. No fevers, chills, cough, urinary complaints, abdominal pain, nausea or vomiting, or diarrhea.  Past Medical History:  Diagnosis Date  . Atrial fibrillation ([place])   . CAD (coronary artery disease)   . Colitis   . Colon cancer ([place])   . Diverticulosis of colon   . DJD (degenerative joint disease)   . DM (diabetes mellitus) ([place])   . HLD (hyperlipidemia)   . HTN (hypertension)   . Hypothyroidism   . Mobitz (type) II atrioventricular block   . Ventral hernia     Patient Active Problem List   Diagnosis Date Noted  . Chronic anticoagulation 05/24/2015  . Permanent atrial fibrillation ([place]) 05/14/2015  . Chronic diastolic CHF (congestive heart failure) ([place]) 05/14/2015  . Acute respiratory failure ([place]) 05/14/2015  . Elevated troponin 05/14/2015  . Type II diabetes mellitus, well controlled ([place]) 05/14/2015  . Hypothyroid 05/14/2015  . Pacemaker 02/12/2012  . History of colon cancer 03/25/2011  . Edema 02/19/2011  . COLITIS 12/06/2008  . DIARRHEA 11/23/2008  . CAD S/P percutaneous coronary angioplasty 09/28/2008  . VENTRAL HERNIA  09/28/2008  . SYNCOPE 09/28/2008  . HYPERLIPIDEMIA 08/10/2007  . Essential hypertension 08/10/2007  . INTERNAL HEMORRHOIDS 08/10/2007  . DEGENERATIVE JOINT DISEASE 08/10/2007  . DIVERTICULOSIS OF COLON 02/17/2007    Past Surgical History:  Procedure Laterality Date  . APPENDECTOMY    . CARDIAC CATHETERIZATION  08/22/1997  . CORONARY ANGIOPLASTY  07/03/2008  . HEMICOLECTOMY  10/07   right  . PACEMAKER INSERTION  07/06/08   MDT [id number] implanted for Mobitz II heart block by Dr Rayann Heman  . ROTATOR CUFF REPAIR    . TRANSTHORACIC ECHOCARDIOGRAM  06/29/2008   EF 55-60%    OB History    No data available       Home Medications    Prior to Admission medications   Medication Sig Start Date End Date Taking? Authorizing Provider  ELIQUIS 5 MG TABS tablet TAKE ONE TABLET BY MOUTH TWICE DAILY Patient taking differently: TAKE 5 MG BY MOUTH TWICE DAILY 03/05/16  Yes Peter M [place], MD  furosemide (LASIX) 40 MG tablet Take 1 tablet (40 mg total) by mouth daily. 11/10/12  Yes Peter M [place], MD  levothyroxine (SYNTHROID, LEVOTHROID) 75 MCG tablet Take 75 mcg by mouth daily before breakfast.   Yes Historical Provider, MD  losartan (COZAAR) 100 MG tablet TAKE ONE TABLET BY MOUTH ONCE DAILY Patient taking differently: TAKE 100 MG BY MOUTH ONCE DAILY 09/21/15  Yes Peter M [place], MD  nitroGLYCERIN (NITROSTAT) 0.6 MG SL tablet Place 1 tablet (0.6 mg  total) under the tongue every 5 (five) minutes as needed for chest pain (MAX 3 TABLETS). 09/25/14 06/13/16 Yes Thompson Grayer, MD  simvastatin (ZOCOR) 40 MG tablet Take 1 tablet (40 mg total) by mouth at bedtime. 11/10/12  Yes Peter M [place], MD  traMADol (ULTRAM) 50 MG tablet Take 1 tablet (50 mg total) by mouth every 6 (six) hours as needed. 06/13/16   Forde Dandy, MD    Family History Family History  Problem Relation Age of Onset  . Heart attack Father     Social History Social History  Substance Use Topics  . Smoking status: Never Smoker  . Smokeless  tobacco: Never Used  . Alcohol use No     Allergies   Diclofenac sodium; Hytrin [terazosin hcl]; Ibuprofen; Norvasc [amlodipine besylate]; and Plavix [clopidogrel bisulfate]   Review of Systems Review of Systems 10/14 systems reviewed and are negative other than those stated in the HPI   Physical Exam Updated Vital Signs BP 155/82 (BP Location: Left Arm)   Pulse 85   Temp 97.4 F (36.3 C) (Oral)   Resp 15   SpO2 98%   Physical Exam Physical Exam  Nursing note and vitals reviewed. Constitutional: Well developed, well nourished, non-toxic, and in no acute distress Head: Normocephalic and atraumatic.  Mouth/Throat: Oropharynx is clear and moist.  Neck: Normal range of motion. Neck supple.  Cardiovascular: Normal rate and regular rhythm.   Pulmonary/Chest: Effort normal and breath sounds normal.  +2 DP pulses Abdominal: Soft. There is no tenderness. There is no rebound and no guarding.  Musculoskeletal: Limited passive and active flexion of the knee due to pain. No significant tenderness to palpation. Mild soft tissue swelling over inferomedial aspect of the knee, normal range of motion of hip and foot.   Neurological: Alert, no facial droop, fluent speech, full strength ankle dorsi and plantar flexion, sensation to light touch intact in bilateral lower extremities Skin: Skin is warm and dry.  Psychiatric: Cooperative   ED Treatments / Results  Labs (all labs ordered are listed, but only abnormal results are displayed) Labs Reviewed - No data to display  EKG  EKG Interpretation None       Radiology Dg Knee Complete 4 Views Left  Result Date: 06/13/2016 CLINICAL DATA:  Onset of left knee pain today.  No known injury. EXAM: LEFT KNEE - COMPLETE 4+ VIEW COMPARISON:  None. FINDINGS: No acute bony or joint abnormality is seen. Severe tricompartmental osteoarthritis is noted. Bones are osteopenic. No fracture. IMPRESSION: No acute abnormality. Advanced tricompartmental  osteoarthritis. Osteopenia. Electronically Signed   By: Inge Rise M.D.   On: 06/13/2016 15:33    Procedures Procedures (including critical care time)  Medications Ordered in ED Medications  acetaminophen (TYLENOL) tablet 650 mg (650 mg Oral Given 06/13/16 1504)  traMADol (ULTRAM) tablet 50 mg (50 mg Oral Given 06/13/16 1608)     Initial Impression / Assessment and Plan / ED Course  I have reviewed the triage vital signs and the nursing notes.  Pertinent labs & imaging results that were available during my care of the patient were reviewed by me and considered in my medical decision making (see chart for details).     83 year old female who presents with nontraumatic left knee pain. Nontoxic in no acute distress with normal vital signs. Minimal soft tissue edema, over the inferomedial aspect of the left knee but no overlying skin changes. No concerns for septic arthritis. Extremities neurovascularly intact. X-ray obtained and visualized showing severe  tricompartmental arthritis, likely causing her pain. We'll attempt pain control and attempt ambulation.  With tramadol and tylenol, ambulating with walker in hallway which is baseline for her. Feels comfortable with discharge. Strict return and follow-up instructions reviewed. She expressed understanding of all discharge instructions and felt comfortable with the plan of care.   Final Clinical Impressions(s) / ED Diagnoses   Final diagnoses:  Acute pain of left knee  Osteoarthritis of left knee, unspecified osteoarthritis type    New Prescriptions New Prescriptions   TRAMADOL (ULTRAM) 50 MG TABLET    Take 1 tablet (50 mg total) by mouth every 6 (six) hours as needed.     Forde Dandy, MD 06/13/16 [phone]

## 2016-06-17 DIAGNOSIS — I5032 Chronic diastolic (congestive) heart failure: Secondary | ICD-10-CM | POA: Diagnosis not present

## 2016-06-17 DIAGNOSIS — Z7984 Long term (current) use of oral hypoglycemic drugs: Secondary | ICD-10-CM | POA: Diagnosis not present

## 2016-06-17 DIAGNOSIS — E781 Pure hyperglyceridemia: Secondary | ICD-10-CM | POA: Diagnosis not present

## 2016-06-17 DIAGNOSIS — I252 Old myocardial infarction: Secondary | ICD-10-CM | POA: Diagnosis not present

## 2016-06-17 DIAGNOSIS — M1712 Unilateral primary osteoarthritis, left knee: Secondary | ICD-10-CM | POA: Diagnosis not present

## 2016-06-17 DIAGNOSIS — E1165 Type 2 diabetes mellitus with hyperglycemia: Secondary | ICD-10-CM | POA: Diagnosis not present

## 2016-06-17 DIAGNOSIS — Z85038 Personal history of other malignant neoplasm of large intestine: Secondary | ICD-10-CM | POA: Diagnosis not present

## 2016-06-17 DIAGNOSIS — E039 Hypothyroidism, unspecified: Secondary | ICD-10-CM | POA: Diagnosis not present

## 2016-06-17 DIAGNOSIS — F329 Major depressive disorder, single episode, unspecified: Secondary | ICD-10-CM | POA: Diagnosis not present

## 2016-06-17 DIAGNOSIS — D649 Anemia, unspecified: Secondary | ICD-10-CM | POA: Diagnosis not present

## 2016-07-03 NOTE — Progress Notes (Signed)
[name] Date of Birth: 09-27-1916   History of Present Illness: [name] is seen for followup CAD and Afib. She is  83 years old. She has a history of coronary disease and is status post stenting of the mid right coronary in February 2010 with a bare-metal stent. She had moderate three-vessel disease at that time. She also has a history of AV block with presyncope and has had previous pacemaker implant. She has a history of chronic atrial fibrillation managed with rate control and anticoagulation. She has been on Eliquis.   She was admitted to the hospital in January 2017 with acute diastolic CHF. She was diuresed with good response. Echo showed EF 50-55% with moderate LVH and restrictive physiology. Moderate MR and TR and biatrial enlargement. Moderate pulmonary HTN.  Last pacer check in November 2017 was normal.   On follow up today she is seen with her niece.  She denies any chest pain or shortness of breath. She's had no dizziness or syncope. No edema.  She walks with a cane. Her niece states she misses her medication at times. Blood sugar elevated and started on metformin by primary care. States she is not willing to change diet at her age.  Current Outpatient Prescriptions on File Prior to Visit  Medication Sig Dispense Refill  . ELIQUIS 5 MG TABS tablet TAKE ONE TABLET BY MOUTH TWICE DAILY (Patient taking differently: TAKE 5 MG BY MOUTH TWICE DAILY) 60 tablet 5  . furosemide (LASIX) 40 MG tablet Take 1 tablet (40 mg total) by mouth daily. 90 tablet 3  . levothyroxine (SYNTHROID, LEVOTHROID) 75 MCG tablet Take 75 mcg by mouth daily before breakfast.    . losartan (COZAAR) 100 MG tablet TAKE ONE TABLET BY MOUTH ONCE DAILY (Patient taking differently: TAKE 100 MG BY MOUTH ONCE DAILY) 30 tablet 6  . simvastatin (ZOCOR) 40 MG tablet Take 1 tablet (40 mg total) by mouth at bedtime. 90 tablet 3  . traMADol (ULTRAM) 50 MG tablet Take 1 tablet (50 mg total) by mouth every 6 (six)  hours as needed. 15 tablet 0  . nitroGLYCERIN (NITROSTAT) 0.6 MG SL tablet Place 1 tablet (0.6 mg total) under the tongue every 5 (five) minutes as needed for chest pain (MAX 3 TABLETS). 25 tablet 11  . [DISCONTINUED] Probiotic Product (ALIGN PO) Take 1 capsule by mouth daily.       No current facility-administered medications on file prior to visit.     Allergies  Allergen Reactions  . Diclofenac Sodium     UNKNOWN  . Hytrin [Terazosin Hcl]     UNKNOWN   . Ibuprofen     UNKNOWN   . Norvasc [Amlodipine Besylate]     UNKNOWN   . Plavix [Clopidogrel Bisulfate]     UNKNOWN     Past Medical History:  Diagnosis Date  . Atrial fibrillation ([place])   . CAD (coronary artery disease)   . Colitis   . Colon cancer ([place])   . Diverticulosis of colon   . DJD (degenerative joint disease)   . DM (diabetes mellitus) ([place])   . HLD (hyperlipidemia)   . HTN (hypertension)   . Hypothyroidism   . Mobitz (type) II atrioventricular block   . Ventral hernia     Past Surgical History:  Procedure Laterality Date  . APPENDECTOMY    . CARDIAC CATHETERIZATION  08/22/1997  . CORONARY ANGIOPLASTY  07/03/2008  . HEMICOLECTOMY  10/07   right  . PACEMAKER INSERTION  07/06/08   MDT [id number] implanted for Mobitz II heart block by Dr Rayann Heman  . ROTATOR CUFF REPAIR    . TRANSTHORACIC ECHOCARDIOGRAM  06/29/2008   EF 55-60%    History  Smoking Status  . Never Smoker  Smokeless Tobacco  . Never Used    History  Alcohol Use No    Family History  Problem Relation Age of Onset  . Heart attack Father     Review of Systems:  as noted in history of present illness.  All other systems were reviewed and are negative.  Physical Exam: BP 136/72   Pulse 86   Ht 5\' 1"  (1.549 m)   Wt 155 lb (70.3 kg)   BMI 29.29 kg/m  She is an elderly white female in no acute distress. She uses a cane to walk. She is normocephalic, atraumatic. Pupils are equal round and reactive. Sclera clear. Oropharynx is clear.  Neck is supple no JVD, adenopathy, thyromegaly, or bruits. Lungs are clear. Cardiac exam reveals a grade 1/6 systolic murmur in the right upper sternal border. Abdomen is obese, soft, nontender. She has a ventral hernia. There is no organomegaly. She has  trace  pretibial edema. Pedal pulses are good. Skin is warm and dry. She is alert and oriented x3. Cranial nerves II through XII are intact.  LABORATORY DATA: Lab Results  Component Value Date   WBC 8.6 10/10/2015   HGB 12.9 10/10/2015   HCT 38.6 10/10/2015   PLT 279 10/10/2015   GLUCOSE 235 (H) 10/10/2015   CHOL  06/28/2008    156        ATP III CLASSIFICATION:  <200     mg/dL   Desirable  200-239  mg/dL   Borderline High  >=240    mg/dL   High          TRIG 160 (H) 06/28/2008   HDL 30 (L) 06/28/2008   LDLCALC  06/28/2008    94        Total Cholesterol/HDL:CHD Risk Coronary Heart Disease Risk Table                     Men   Women  1/2 Average Risk   3.4   3.3  Average Risk       5.0   4.4  2 X Average Risk   9.6   7.1  3 X Average Risk  23.4   11.0        Use the calculated Patient Ratio above and the CHD Risk Table to determine the patient's CHD Risk.        ATP III CLASSIFICATION (LDL):  <100     mg/dL   Optimal  100-129  mg/dL   Near or Above                    Optimal  130-159  mg/dL   Borderline  160-189  mg/dL   High  >190     mg/dL   Very High   ALT 51 05/15/2015   AST 58 (H) 05/15/2015   NA 140 10/10/2015   K 4.0 10/10/2015   CL 101 10/10/2015   CREATININE 1.01 (H) 10/10/2015   BUN 18 10/10/2015   CO2 26 10/10/2015   TSH 8.303 (H) 05/16/2015   INR 1.33 05/14/2015   HGBA1C 8.4 (H) 05/14/2015   Labs dated 01/30/15 cholesterol 142, triglycerides 136, HDL 40, LDL 71  Assessment / Plan: 1. Coronary disease status post stenting  of the mid RCA in February 2010 with a bare-metal stent. Has moderate three-vessel disease. She is asymptomatic. Not on ASA due to Eliquis. Continue statin Rx.  2. Complete heart  block status post pacemaker implant. Now programmed to VVI mode due to chronic Afib. followed in pacer clinic.  3. Hypertension, controlled. Continue losartan.  4. Atrial fibrillation. Rate controlled. On Eliquis. Niece reports recent labs showed normal renal function. Will request a copy.  5. Chronic diastolic CHF. Appears to be well compensated. Instructed to weigh daily and watch salt. Continue current therapy.   I will follow up in 6 months.

## 2016-07-04 ENCOUNTER — Encounter: Payer: Self-pay | Admitting: Cardiology

## 2016-07-04 ENCOUNTER — Ambulatory Visit (INDEPENDENT_AMBULATORY_CARE_PROVIDER_SITE_OTHER): Payer: Medicare Other | Admitting: Cardiology

## 2016-07-04 VITALS — BP 136/72 | HR 86 | Ht 61.0 in | Wt 155.0 lb

## 2016-07-04 DIAGNOSIS — I5032 Chronic diastolic (congestive) heart failure: Secondary | ICD-10-CM | POA: Diagnosis not present

## 2016-07-04 DIAGNOSIS — Z9861 Coronary angioplasty status: Secondary | ICD-10-CM

## 2016-07-04 DIAGNOSIS — I10 Essential (primary) hypertension: Secondary | ICD-10-CM | POA: Diagnosis not present

## 2016-07-04 DIAGNOSIS — Z950 Presence of cardiac pacemaker: Secondary | ICD-10-CM | POA: Diagnosis not present

## 2016-07-04 DIAGNOSIS — I482 Chronic atrial fibrillation: Secondary | ICD-10-CM

## 2016-07-04 DIAGNOSIS — I2510 Atherosclerotic heart disease of native coronary artery without angina pectoris: Secondary | ICD-10-CM

## 2016-07-04 DIAGNOSIS — I4821 Permanent atrial fibrillation: Secondary | ICD-10-CM

## 2016-07-04 NOTE — Patient Instructions (Signed)
Continue your current therapy  I will see you in 6 months.   

## 2016-07-09 DIAGNOSIS — Z0000 Encounter for general adult medical examination without abnormal findings: Secondary | ICD-10-CM | POA: Diagnosis not present

## 2016-07-09 DIAGNOSIS — I5032 Chronic diastolic (congestive) heart failure: Secondary | ICD-10-CM | POA: Diagnosis not present

## 2016-07-09 DIAGNOSIS — I252 Old myocardial infarction: Secondary | ICD-10-CM | POA: Diagnosis not present

## 2016-07-09 DIAGNOSIS — F329 Major depressive disorder, single episode, unspecified: Secondary | ICD-10-CM | POA: Diagnosis not present

## 2016-07-09 DIAGNOSIS — Z23 Encounter for immunization: Secondary | ICD-10-CM | POA: Diagnosis not present

## 2016-07-09 DIAGNOSIS — E039 Hypothyroidism, unspecified: Secondary | ICD-10-CM | POA: Diagnosis not present

## 2016-07-09 DIAGNOSIS — I10 Essential (primary) hypertension: Secondary | ICD-10-CM | POA: Diagnosis not present

## 2016-07-09 DIAGNOSIS — Z85038 Personal history of other malignant neoplasm of large intestine: Secondary | ICD-10-CM | POA: Diagnosis not present

## 2016-07-09 DIAGNOSIS — E785 Hyperlipidemia, unspecified: Secondary | ICD-10-CM | POA: Diagnosis not present

## 2016-07-09 DIAGNOSIS — E1165 Type 2 diabetes mellitus with hyperglycemia: Secondary | ICD-10-CM | POA: Diagnosis not present

## 2016-08-14 ENCOUNTER — Other Ambulatory Visit: Payer: Self-pay | Admitting: Cardiology

## 2016-08-22 ENCOUNTER — Encounter: Payer: Self-pay | Admitting: Internal Medicine

## 2016-08-27 ENCOUNTER — Other Ambulatory Visit: Payer: Self-pay

## 2016-08-27 MED ORDER — LOSARTAN POTASSIUM 100 MG PO TABS: 100.0000 mg | ORAL_TABLET | Freq: Every day | ORAL | 3 refills | Status: DC

## 2016-08-27 NOTE — Telephone Encounter (Signed)
Rx(s) sent to pharmacy electronically.  

## 2016-09-09 DIAGNOSIS — M810 Age-related osteoporosis without current pathological fracture: Secondary | ICD-10-CM | POA: Diagnosis not present

## 2016-09-09 DIAGNOSIS — I10 Essential (primary) hypertension: Secondary | ICD-10-CM | POA: Diagnosis not present

## 2016-09-09 DIAGNOSIS — E785 Hyperlipidemia, unspecified: Secondary | ICD-10-CM | POA: Diagnosis not present

## 2016-09-09 DIAGNOSIS — I5032 Chronic diastolic (congestive) heart failure: Secondary | ICD-10-CM | POA: Diagnosis not present

## 2016-09-09 DIAGNOSIS — Z7984 Long term (current) use of oral hypoglycemic drugs: Secondary | ICD-10-CM | POA: Diagnosis not present

## 2016-09-09 DIAGNOSIS — E1165 Type 2 diabetes mellitus with hyperglycemia: Secondary | ICD-10-CM | POA: Diagnosis not present

## 2016-09-10 ENCOUNTER — Ambulatory Visit (INDEPENDENT_AMBULATORY_CARE_PROVIDER_SITE_OTHER): Payer: Medicare Other | Admitting: Internal Medicine

## 2016-09-10 VITALS — BP 130/70 | HR 83 | Ht 63.0 in | Wt 158.2 lb

## 2016-09-10 DIAGNOSIS — I482 Chronic atrial fibrillation: Secondary | ICD-10-CM

## 2016-09-10 DIAGNOSIS — Z9861 Coronary angioplasty status: Secondary | ICD-10-CM

## 2016-09-10 DIAGNOSIS — I2510 Atherosclerotic heart disease of native coronary artery without angina pectoris: Secondary | ICD-10-CM

## 2016-09-10 DIAGNOSIS — I442 Atrioventricular block, complete: Secondary | ICD-10-CM | POA: Diagnosis not present

## 2016-09-10 DIAGNOSIS — I4821 Permanent atrial fibrillation: Secondary | ICD-10-CM

## 2016-09-10 LAB — CUP PACEART INCLINIC DEVICE CHECK
Battery Impedance: 2343 Ohm
Battery Remaining Longevity: 27 mo
Date Time Interrogation Session: 20180502113947
Implantable Lead Implant Date: 20100225
Implantable Lead Location: 753859
Implantable Lead Model: 5076
Implantable Lead Model: 5076
Implantable Pulse Generator Implant Date: 20100225
Lead Channel Pacing Threshold Amplitude: 1 V
Lead Channel Pacing Threshold Pulse Width: 0.4 ms
Lead Channel Sensing Intrinsic Amplitude: 22.4 mV
Lead Channel Setting Pacing Amplitude: 2.5 V
MDC IDC LEAD IMPLANT DT: 20100225
MDC IDC LEAD LOCATION: 753860
MDC IDC MSMT BATTERY VOLTAGE: 2.74 V
MDC IDC MSMT LEADCHNL RV IMPEDANCE VALUE: 505 Ohm
MDC IDC SET LEADCHNL RV PACING PULSEWIDTH: 0.4 ms
MDC IDC SET LEADCHNL RV SENSING SENSITIVITY: 4 mV
MDC IDC STAT BRADY RV PERCENT PACED: 99 %

## 2016-09-10 NOTE — Progress Notes (Signed)
PCP: Leeroy Cha, MD Primary Cardiologist: [place]  [name] Ether Bell is a 83 y.o. female who presents today for routine electrophysiology followup.  Since last being seen in our clinic, the patient reports doing very well.  Asymptomatic permanent afib.  She still lives independently and is doing well.  She does not drive but continues to shop and go to church. Today, she denies symptoms of palpitations, shortness of breath,  lower extremity edema, dizziness, presyncope, or syncope.  The patient is otherwise without complaint today.   Past Medical History:  Diagnosis Date  . Atrial fibrillation ([place])   . CAD (coronary artery disease)   . Colitis   . Colon cancer ([place])   . Diverticulosis of colon   . DJD (degenerative joint disease)   . DM (diabetes mellitus) ([place])   . HLD (hyperlipidemia)   . HTN (hypertension)   . Hypothyroidism   . Mobitz (type) II atrioventricular block   . Ventral hernia    Past Surgical History:  Procedure Laterality Date  . APPENDECTOMY    . CARDIAC CATHETERIZATION  08/22/1997  . CORONARY ANGIOPLASTY  07/03/2008  . HEMICOLECTOMY  10/07   right  . PACEMAKER INSERTION  07/06/08   MDT [id number] implanted for Mobitz II heart block by Dr Rayann Heman  . ROTATOR CUFF REPAIR    . TRANSTHORACIC ECHOCARDIOGRAM  06/29/2008   EF 55-60%    Current Outpatient Prescriptions  Medication Sig Dispense Refill  . ELIQUIS 5 MG TABS tablet TAKE ONE TABLET BY MOUTH TWICE DAILY 60 tablet 5  . furosemide (LASIX) 40 MG tablet Take 1 tablet (40 mg total) by mouth daily. 90 tablet 3  . levothyroxine (SYNTHROID, LEVOTHROID) 75 MCG tablet Take 75 mcg by mouth daily before breakfast.    . losartan (COZAAR) 100 MG tablet Take 1 tablet (100 mg total) by mouth daily. 90 tablet 3  . simvastatin (ZOCOR) 40 MG tablet Take 1 tablet (40 mg total) by mouth at bedtime. 90 tablet 3  . traMADol (ULTRAM) 50 MG tablet Take 1 tablet (50 mg total) by mouth every 6 (six) hours as needed. 15 tablet 0    . nitroGLYCERIN (NITROSTAT) 0.6 MG SL tablet Place 1 tablet (0.6 mg total) under the tongue every 5 (five) minutes as needed for chest pain (MAX 3 TABLETS). 25 tablet 11   No current facility-administered medications for this visit.     Physical Exam: Vitals:   09/10/16 1056  BP: 130/70  Pulse: 83  SpO2: 92%  Weight: 158 lb 4 oz (71.8 kg)  Height: 5\' 3"  (1.6 m)    GEN- The patient is elderly appearing, alert and oriented x 3 today.   Head- normocephalic, atraumatic Eyes-  Sclera clear, conjunctiva pink Ears- hearing intact Oropharynx- clear Lungs- Clear to ausculation bilaterally, normal work of breathing Chest- pacemaker pocket is well healed Heart- Regular rate and rhythm (paced) GI- soft, NT, ND, + BS Extremities- no clubbing, cyanosis, or edema  Pacemaker interrogation- personally reviewed in detail today,  See PACEART report  ekg today reveals afib, V paced  Assessment and Plan:  1. Complete heart block Normal pacemaker function See Pace Art report No changes today 27 months estimated battery longevity Importance of remotes discussed with the patient  2.  Atrial fibrillation Continue Eliquis Labs followed by primary care  3. Code status Previously DNR/DNI  Follow-up with Dr Martinique as scheduled Return to the device clinic to see Chanetta Marshall NP in 1year carelink  Thompson Grayer MD, [facility] 09/10/2016  11:20 AM

## 2016-09-10 NOTE — Patient Instructions (Signed)
Medication Instructions:  Your physician recommends that you continue on your current medications as directed. Please refer to the Current Medication list given to you today.   Labwork: None ordered   Testing/Procedures: None ordered   Follow-Up: Remote monitoring is used to monitor your Pacemaker  from home. This monitoring reduces the number of office visits required to check your device to one time per year. It allows [name] to keep an eye on the functioning of your device to ensure it is working properly. You are scheduled for a device check from home on 12/10/16. You may send your transmission at any time that day. If you have a wireless device, the transmission will be sent automatically. After your physician reviews your transmission, you will receive a postcard with your next transmission date.  Your physician wants you to follow-up in: 12 months with Chanetta Marshall, NP You will receive a reminder letter in the mail two months in advance. If you don't receive a letter, please call our office to schedule the follow-up appointment.       Any Other Special Instructions Will Be Listed Below (If Applicable).     If you need a refill on your cardiac medications before your next appointment, please call your pharmacy.

## 2016-11-03 ENCOUNTER — Other Ambulatory Visit: Payer: Self-pay

## 2016-11-03 MED ORDER — LOSARTAN POTASSIUM 100 MG PO TABS: 100.0000 mg | ORAL_TABLET | Freq: Every day | ORAL | 3 refills | Status: DC

## 2016-11-26 DIAGNOSIS — H353131 Nonexudative age-related macular degeneration, bilateral, early dry stage: Secondary | ICD-10-CM | POA: Diagnosis not present

## 2016-12-11 ENCOUNTER — Ambulatory Visit (INDEPENDENT_AMBULATORY_CARE_PROVIDER_SITE_OTHER): Payer: Medicare Other | Admitting: *Deleted

## 2016-12-11 ENCOUNTER — Telehealth: Payer: Self-pay | Admitting: Cardiology

## 2016-12-11 DIAGNOSIS — I442 Atrioventricular block, complete: Secondary | ICD-10-CM | POA: Diagnosis not present

## 2016-12-11 NOTE — Telephone Encounter (Signed)
Spoke with pt and reminded pt of remote transmission that is due today. Pt verbalized understanding.   

## 2016-12-12 ENCOUNTER — Encounter: Payer: Self-pay | Admitting: Cardiology

## 2016-12-12 LAB — CUP PACEART REMOTE DEVICE CHECK
Battery Remaining Longevity: 25 mo
Battery Voltage: 2.74 V
Brady Statistic RV Percent Paced: 99 %
Date Time Interrogation Session: 20180802170700
Implantable Lead Implant Date: 20100225
Implantable Lead Location: 753859
Implantable Lead Model: 5076
Implantable Pulse Generator Implant Date: 20100225
Lead Channel Pacing Threshold Amplitude: 1.125 V
Lead Channel Pacing Threshold Pulse Width: 0.4 ms
Lead Channel Setting Pacing Amplitude: 2.5 V
Lead Channel Setting Pacing Pulse Width: 0.4 ms
MDC IDC LEAD IMPLANT DT: 20100225
MDC IDC LEAD LOCATION: 753860
MDC IDC MSMT BATTERY IMPEDANCE: 2502 Ohm
MDC IDC MSMT LEADCHNL RA IMPEDANCE VALUE: 67 Ohm
MDC IDC MSMT LEADCHNL RV IMPEDANCE VALUE: 481 Ohm
MDC IDC SET LEADCHNL RV SENSING SENSITIVITY: 4 mV

## 2016-12-12 NOTE — Progress Notes (Signed)
Remote pacemaker transmission.   

## 2016-12-23 NOTE — Progress Notes (Signed)
[name] Date of Birth: March 06, 1917   History of Present Illness: [name] is seen for followup CAD and Afib. She is  83 years old. She has a history of coronary disease and is status post stenting of the mid right coronary in February 2010 with a bare-metal stent. She had moderate three-vessel disease at that time. She also has a history of AV block with presyncope and has had previous pacemaker implant. She has a history of chronic atrial fibrillation managed with rate control and anticoagulation. She has been on Eliquis.   She was admitted to the hospital in January 2017 with acute diastolic CHF. She was diuresed with good response. Echo showed EF 50-55% with moderate LVH and restrictive physiology. Moderate MR and TR and biatrial enlargement. Moderate pulmonary HTN.  Last pacer check on December 11, 2016 was normal.   On follow up today she is seen with her niece.  She denies any chest pain or shortness of breath. She's had no dizziness or syncope. No edema.  She walks with a cane. She no longer drives. She does not salt her food but sometimes eats things that are salty. She complains of some pain in the groin area when she first gets up but this improves with movement.   Current Outpatient Prescriptions on File Prior to Visit  Medication Sig Dispense Refill  . ELIQUIS 5 MG TABS tablet TAKE ONE TABLET BY MOUTH TWICE DAILY 60 tablet 5  . furosemide (LASIX) 40 MG tablet Take 1 tablet (40 mg total) by mouth daily. 90 tablet 3  . levothyroxine (SYNTHROID, LEVOTHROID) 75 MCG tablet Take 75 mcg by mouth daily before breakfast.    . losartan (COZAAR) 100 MG tablet Take 1 tablet (100 mg total) by mouth daily. 90 tablet 3  . simvastatin (ZOCOR) 40 MG tablet Take 1 tablet (40 mg total) by mouth at bedtime. 90 tablet 3  . [DISCONTINUED] Probiotic Product (ALIGN PO) Take 1 capsule by mouth daily.       No current facility-administered medications on file prior to visit.     Allergies   Allergen Reactions  . Diclofenac Sodium     UNKNOWN  . Hytrin [Terazosin Hcl]     UNKNOWN   . Ibuprofen     UNKNOWN   . Norvasc [Amlodipine Besylate]     UNKNOWN   . Plavix [Clopidogrel Bisulfate]     UNKNOWN     Past Medical History:  Diagnosis Date  . Atrial fibrillation ([place])   . CAD (coronary artery disease)   . Colitis   . Colon cancer ([place])   . Diverticulosis of colon   . DJD (degenerative joint disease)   . DM (diabetes mellitus) ([place])   . HLD (hyperlipidemia)   . HTN (hypertension)   . Hypothyroidism   . Mobitz (type) II atrioventricular block   . Ventral hernia     Past Surgical History:  Procedure Laterality Date  . APPENDECTOMY    . CARDIAC CATHETERIZATION  08/22/1997  . CORONARY ANGIOPLASTY  07/03/2008  . HEMICOLECTOMY  10/07   right  . PACEMAKER INSERTION  07/06/08   MDT [id number] implanted for Mobitz II heart block by Dr Rayann Heman  . ROTATOR CUFF REPAIR    . TRANSTHORACIC ECHOCARDIOGRAM  06/29/2008   EF 55-60%    History  Smoking Status  . Never Smoker  Smokeless Tobacco  . Never Used    History  Alcohol Use No    Family History  Problem Relation Age  of Onset  . Heart attack Father     Review of Systems:  as noted in history of present illness.  All other systems were reviewed and are negative.  Physical Exam: BP 140/70 (BP Location: Left Arm, Cuff Size: Normal)   Pulse 90   Ht 5\' 3"  (1.6 m)   Wt 161 lb 12.8 oz (73.4 kg)   SpO2 98%   BMI 28.66 kg/m  She is an elderly white female in no acute distress. She uses a cane to walk. She is normocephalic, atraumatic. Pupils are equal round and reactive. Sclera clear. Oropharynx is clear. Neck is supple no JVD, adenopathy, thyromegaly, or bruits. Lungs are clear. Cardiac exam reveals a grade 1/6 systolic murmur in the right upper sternal border. Abdomen is obese, soft, nontender. She has a ventral hernia. There is no organomegaly. She has  trace  pretibial edema. Pedal pulses are good. Skin is  warm and dry. She is alert and oriented x3. Cranial nerves II through XII are intact.  LABORATORY DATA: Lab Results  Component Value Date   WBC 8.6 10/10/2015   HGB 12.9 10/10/2015   HCT 38.6 10/10/2015   PLT 279 10/10/2015   GLUCOSE 235 (H) 10/10/2015   CHOL  06/28/2008    156        ATP III CLASSIFICATION:  <200     mg/dL   Desirable  200-239  mg/dL   Borderline High  >=240    mg/dL   High          TRIG 160 (H) 06/28/2008   HDL 30 (L) 06/28/2008   LDLCALC  06/28/2008    94        Total Cholesterol/HDL:CHD Risk Coronary Heart Disease Risk Table                     Men   Women  1/2 Average Risk   3.4   3.3  Average Risk       5.0   4.4  2 X Average Risk   9.6   7.1  3 X Average Risk  23.4   11.0        Use the calculated Patient Ratio above and the CHD Risk Table to determine the patient's CHD Risk.        ATP III CLASSIFICATION (LDL):  <100     mg/dL   Optimal  100-129  mg/dL   Near or Above                    Optimal  130-159  mg/dL   Borderline  160-189  mg/dL   High  >190     mg/dL   Very High   ALT 51 05/15/2015   AST 58 (H) 05/15/2015   NA 140 10/10/2015   K 4.0 10/10/2015   CL 101 10/10/2015   CREATININE 1.01 (H) 10/10/2015   BUN 18 10/10/2015   CO2 26 10/10/2015   TSH 8.303 (H) 05/16/2015   INR 1.33 05/14/2015   HGBA1C 8.4 (H) 05/14/2015   Labs dated 01/30/15 cholesterol 142, triglycerides 136, HDL 40, LDL 71 Dated 06/17/16: cholesterol 191, triglycerides 442, HDL 40, LDL 85. A1c 9%. Hgb, chemistries, TSH normal.   Assessment / Plan: 1. Coronary disease status post stenting of the mid RCA in February 2010 with a bare-metal stent. Has moderate three-vessel disease. She is asymptomatic. Not on ASA due to Eliquis. Continue statin Rx.  2. Complete heart block status post pacemaker implant.  Now programmed to VVI mode due to chronic Afib. Pacemaker check normal in August.   3. Hypertension, controlled. Continue losartan.  4. Atrial fibrillation. Rate  controlled. On Eliquis.   5. Chronic diastolic CHF. Appears to be well compensated. Instructed to weigh daily and watch salt. Continue current therapy.   I will follow up in 6 months.

## 2016-12-25 ENCOUNTER — Encounter: Payer: Self-pay | Admitting: Cardiology

## 2016-12-25 ENCOUNTER — Ambulatory Visit (INDEPENDENT_AMBULATORY_CARE_PROVIDER_SITE_OTHER): Payer: Medicare Other | Admitting: Cardiology

## 2016-12-25 VITALS — BP 140/70 | HR 90 | Ht 63.0 in | Wt 161.8 lb

## 2016-12-25 DIAGNOSIS — I482 Chronic atrial fibrillation: Secondary | ICD-10-CM | POA: Diagnosis not present

## 2016-12-25 DIAGNOSIS — I10 Essential (primary) hypertension: Secondary | ICD-10-CM

## 2016-12-25 DIAGNOSIS — Z9861 Coronary angioplasty status: Secondary | ICD-10-CM

## 2016-12-25 DIAGNOSIS — I442 Atrioventricular block, complete: Secondary | ICD-10-CM

## 2016-12-25 DIAGNOSIS — I2510 Atherosclerotic heart disease of native coronary artery without angina pectoris: Secondary | ICD-10-CM | POA: Diagnosis not present

## 2016-12-25 DIAGNOSIS — I5032 Chronic diastolic (congestive) heart failure: Secondary | ICD-10-CM

## 2016-12-25 DIAGNOSIS — I4821 Permanent atrial fibrillation: Secondary | ICD-10-CM

## 2016-12-25 NOTE — Patient Instructions (Signed)
Continue your current therapy  I will see you in 6 months.   

## 2017-01-09 ENCOUNTER — Other Ambulatory Visit: Payer: Self-pay | Admitting: Cardiology

## 2017-01-15 DIAGNOSIS — Z7984 Long term (current) use of oral hypoglycemic drugs: Secondary | ICD-10-CM | POA: Diagnosis not present

## 2017-01-15 DIAGNOSIS — E039 Hypothyroidism, unspecified: Secondary | ICD-10-CM | POA: Diagnosis not present

## 2017-01-15 DIAGNOSIS — E1165 Type 2 diabetes mellitus with hyperglycemia: Secondary | ICD-10-CM | POA: Diagnosis not present

## 2017-01-15 DIAGNOSIS — I10 Essential (primary) hypertension: Secondary | ICD-10-CM | POA: Diagnosis not present

## 2017-02-20 DIAGNOSIS — Z23 Encounter for immunization: Secondary | ICD-10-CM | POA: Diagnosis not present

## 2017-03-12 ENCOUNTER — Ambulatory Visit (INDEPENDENT_AMBULATORY_CARE_PROVIDER_SITE_OTHER): Payer: Medicare Other | Admitting: *Deleted

## 2017-03-12 DIAGNOSIS — I442 Atrioventricular block, complete: Secondary | ICD-10-CM

## 2017-03-13 ENCOUNTER — Encounter: Payer: Self-pay | Admitting: Cardiology

## 2017-03-13 NOTE — Progress Notes (Signed)
Remote pacemaker transmission.   

## 2017-04-08 LAB — CUP PACEART REMOTE DEVICE CHECK
Battery Remaining Longevity: 25 mo
Implantable Lead Implant Date: 20100225
Implantable Lead Model: 5076
Implantable Pulse Generator Implant Date: 20100225
Lead Channel Impedance Value: 535 Ohm
Lead Channel Pacing Threshold Amplitude: 1.125 V
Lead Channel Setting Pacing Amplitude: 2.5 V
Lead Channel Setting Sensing Sensitivity: 4 mV
MDC IDC LEAD IMPLANT DT: 20100225
MDC IDC LEAD LOCATION: 753859
MDC IDC LEAD LOCATION: 753860
MDC IDC MSMT BATTERY IMPEDANCE: 2629 Ohm
MDC IDC MSMT BATTERY VOLTAGE: 2.74 V
MDC IDC MSMT LEADCHNL RA IMPEDANCE VALUE: 67 Ohm
MDC IDC MSMT LEADCHNL RV PACING THRESHOLD PULSEWIDTH: 0.4 ms
MDC IDC SESS DTM: 20181101163255
MDC IDC SET LEADCHNL RV PACING PULSEWIDTH: 0.4 ms
MDC IDC STAT BRADY RV PERCENT PACED: 99 %

## 2017-06-11 ENCOUNTER — Ambulatory Visit (INDEPENDENT_AMBULATORY_CARE_PROVIDER_SITE_OTHER): Payer: PPO | Admitting: *Deleted

## 2017-06-11 ENCOUNTER — Telehealth: Payer: Self-pay | Admitting: Cardiology

## 2017-06-11 DIAGNOSIS — I442 Atrioventricular block, complete: Secondary | ICD-10-CM

## 2017-06-11 NOTE — Telephone Encounter (Signed)
Confirmed remote transmission w/ pt family member.   

## 2017-06-12 ENCOUNTER — Encounter: Payer: Self-pay | Admitting: Cardiology

## 2017-06-12 NOTE — Progress Notes (Signed)
Remote pacemaker transmission.   

## 2017-06-15 DIAGNOSIS — E039 Hypothyroidism, unspecified: Secondary | ICD-10-CM | POA: Diagnosis not present

## 2017-06-15 DIAGNOSIS — M810 Age-related osteoporosis without current pathological fracture: Secondary | ICD-10-CM | POA: Diagnosis not present

## 2017-06-15 DIAGNOSIS — F329 Major depressive disorder, single episode, unspecified: Secondary | ICD-10-CM | POA: Diagnosis not present

## 2017-06-15 DIAGNOSIS — E781 Pure hyperglyceridemia: Secondary | ICD-10-CM | POA: Diagnosis not present

## 2017-06-15 DIAGNOSIS — I4891 Unspecified atrial fibrillation: Secondary | ICD-10-CM | POA: Diagnosis not present

## 2017-06-15 DIAGNOSIS — I10 Essential (primary) hypertension: Secondary | ICD-10-CM | POA: Diagnosis not present

## 2017-06-15 DIAGNOSIS — I252 Old myocardial infarction: Secondary | ICD-10-CM | POA: Diagnosis not present

## 2017-06-15 DIAGNOSIS — D649 Anemia, unspecified: Secondary | ICD-10-CM | POA: Diagnosis not present

## 2017-06-15 DIAGNOSIS — I5032 Chronic diastolic (congestive) heart failure: Secondary | ICD-10-CM | POA: Diagnosis not present

## 2017-06-15 DIAGNOSIS — E1165 Type 2 diabetes mellitus with hyperglycemia: Secondary | ICD-10-CM | POA: Diagnosis not present

## 2017-06-15 DIAGNOSIS — M1712 Unilateral primary osteoarthritis, left knee: Secondary | ICD-10-CM | POA: Diagnosis not present

## 2017-06-15 DIAGNOSIS — E785 Hyperlipidemia, unspecified: Secondary | ICD-10-CM | POA: Diagnosis not present

## 2017-06-21 NOTE — Progress Notes (Signed)
[name] Date of Birth: 18-Aug-1916   History of Present Illness: [name] is seen for followup CAD and Afib. She is  84 years old. She has a history of coronary disease and is status post stenting of the mid right coronary in February 2010 with a bare-metal stent. She had moderate three-vessel disease at that time. She also has a history of AV block with presyncope and has had previous pacemaker implant. She has a history of chronic atrial fibrillation managed with rate control and anticoagulation. She has been on Eliquis.   She was admitted to the hospital in January 2017 with acute diastolic CHF. She was diuresed with good response. Echo showed EF 50-55% with moderate LVH and restrictive physiology. Moderate MR and TR and biatrial enlargement. Moderate pulmonary HTN.  Last pacer check on March 12, 2017 was normal.   On follow up today she is seen with her niece.  She thinks her memory is worse but her niece states it is fine. She can remember names fine and takes care of herself. She does have Meals on Wheels and a lady checks on her daily. She denies any chest pain, SOB, palpitations, edema, dizziness.   Current Outpatient Medications on File Prior to Visit  Medication Sig Dispense Refill  . ELIQUIS 5 MG TABS tablet TAKE ONE TABLET BY MOUTH TWICE DAILY 180 tablet 1  . furosemide (LASIX) 40 MG tablet Take 1 tablet (40 mg total) by mouth daily. 90 tablet 3  . levothyroxine (SYNTHROID, LEVOTHROID) 75 MCG tablet Take 75 mcg by mouth daily before breakfast.    . losartan (COZAAR) 100 MG tablet Take 1 tablet (100 mg total) by mouth daily. 90 tablet 3  . nitroGLYCERIN (NITROSTAT) 0.4 MG SL tablet Place 0.4 mg under the tongue every 5 (five) minutes as needed for chest pain.    . simvastatin (ZOCOR) 40 MG tablet Take 1 tablet (40 mg total) by mouth at bedtime. 90 tablet 3  . [DISCONTINUED] Probiotic Product (ALIGN PO) Take 1 capsule by mouth daily.       No current  facility-administered medications on file prior to visit.     Allergies  Allergen Reactions  . Diclofenac Sodium     UNKNOWN  . Hytrin [Terazosin Hcl]     UNKNOWN   . Ibuprofen     UNKNOWN   . Norvasc [Amlodipine Besylate]     UNKNOWN   . Plavix [Clopidogrel Bisulfate]     UNKNOWN     Past Medical History:  Diagnosis Date  . Atrial fibrillation ([place])   . CAD (coronary artery disease)   . Colitis   . Colon cancer ([place])   . Diverticulosis of colon   . DJD (degenerative joint disease)   . DM (diabetes mellitus) ([place])   . HLD (hyperlipidemia)   . HTN (hypertension)   . Hypothyroidism   . Mobitz (type) II atrioventricular block   . Ventral hernia     Past Surgical History:  Procedure Laterality Date  . APPENDECTOMY    . CARDIAC CATHETERIZATION  08/22/1997  . CORONARY ANGIOPLASTY  07/03/2008  . HEMICOLECTOMY  10/07   right  . PACEMAKER INSERTION  07/06/08   MDT [id number] implanted for Mobitz II heart block by Dr Rayann Heman  . ROTATOR CUFF REPAIR    . TRANSTHORACIC ECHOCARDIOGRAM  06/29/2008   EF 55-60%    Social History   Tobacco Use  Smoking Status Never Smoker  Smokeless Tobacco Never Used    Social History  Substance and Sexual Activity  Alcohol Use No    Family History  Problem Relation Age of Onset  . Heart attack Father     Review of Systems:  as noted in history of present illness.  All other systems were reviewed and are negative.  Physical Exam: BP 120/84   Pulse 82   Ht 5\' 1"  (1.549 m)   Wt 160 lb (72.6 kg)   BMI 30.23 kg/m  GENERAL:  Well appearing elderly WF in NAD HEENT:  PERRL, EOMI, sclera are clear. Oropharynx is clear. NECK:  No jugular venous distention, carotid upstroke brisk and symmetric, no bruits, no thyromegaly or adenopathy LUNGS:  Clear to auscultation bilaterally CHEST:  Unremarkable HEART:  IRRR,  PMI not displaced or sustained,S1 and S2 within normal limits, no S3, no S4: no clicks, no rubs, soft systolic murmur  RUSB ABD:  Soft, nontender. BS +, no masses or bruits. No hepatomegaly, no splenomegaly EXT:  2 + pulses throughout, no edema, no cyanosis no clubbing SKIN:  Warm and dry.  No rashes NEURO:  Alert and oriented x 3. Cranial nerves II through XII intact. PSYCH:  Cognitively intact    LABORATORY DATA: Lab Results  Component Value Date   WBC 8.6 10/10/2015   HGB 12.9 10/10/2015   HCT 38.6 10/10/2015   PLT 279 10/10/2015   GLUCOSE 235 (H) 10/10/2015   CHOL  06/28/2008    156        ATP III CLASSIFICATION:  <200     mg/dL   Desirable  200-239  mg/dL   Borderline High  >=240    mg/dL   High          TRIG 160 (H) 06/28/2008   HDL 30 (L) 06/28/2008   LDLCALC  06/28/2008    94        Total Cholesterol/HDL:CHD Risk Coronary Heart Disease Risk Table                     Men   Women  1/2 Average Risk   3.4   3.3  Average Risk       5.0   4.4  2 X Average Risk   9.6   7.1  3 X Average Risk  23.4   11.0        Use the calculated Patient Ratio above and the CHD Risk Table to determine the patient's CHD Risk.        ATP III CLASSIFICATION (LDL):  <100     mg/dL   Optimal  100-129  mg/dL   Near or Above                    Optimal  130-159  mg/dL   Borderline  160-189  mg/dL   High  >190     mg/dL   Very High   ALT 51 05/15/2015   AST 58 (H) 05/15/2015   NA 140 10/10/2015   K 4.0 10/10/2015   CL 101 10/10/2015   CREATININE 1.01 (H) 10/10/2015   BUN 18 10/10/2015   CO2 26 10/10/2015   TSH 8.303 (H) 05/16/2015   INR 1.33 05/14/2015   HGBA1C 8.4 (H) 05/14/2015   Labs dated 01/30/15 cholesterol 142, triglycerides 136, HDL 40, LDL 71 Dated 06/17/16: cholesterol 191, triglycerides 442, HDL 40, LDL 85. A1c 9%. Hgb, chemistries, TSH normal.   Assessment / Plan: 1. Coronary disease status post stenting of the mid RCA in February 2010 with a  bare-metal stent. Has moderate three-vessel disease. She is asymptomatic. Not on ASA due to Eliquis. Continue statin Rx.  2. Complete heart block  status post pacemaker implant. Now programmed to VVI mode due to chronic Afib. Pacemaker check normal in November 2018. Was recently checked in January but report not in computer.   3. Hypertension, controlled. Continue losartan.  4. Atrial fibrillation. Rate controlled. On Eliquis.   5. Chronic diastolic CHF. Appears to be well compensated. Continue to  watch salt. Continue current therapy.   I will follow up in 6 months.

## 2017-06-24 ENCOUNTER — Ambulatory Visit (INDEPENDENT_AMBULATORY_CARE_PROVIDER_SITE_OTHER): Payer: PPO | Admitting: Cardiology

## 2017-06-24 ENCOUNTER — Encounter: Payer: Self-pay | Admitting: Cardiology

## 2017-06-24 VITALS — BP 120/84 | HR 82 | Ht 61.0 in | Wt 160.0 lb

## 2017-06-24 DIAGNOSIS — I5032 Chronic diastolic (congestive) heart failure: Secondary | ICD-10-CM

## 2017-06-24 DIAGNOSIS — I2510 Atherosclerotic heart disease of native coronary artery without angina pectoris: Secondary | ICD-10-CM | POA: Diagnosis not present

## 2017-06-24 DIAGNOSIS — I442 Atrioventricular block, complete: Secondary | ICD-10-CM

## 2017-06-24 DIAGNOSIS — Z950 Presence of cardiac pacemaker: Secondary | ICD-10-CM

## 2017-06-24 DIAGNOSIS — I4821 Permanent atrial fibrillation: Secondary | ICD-10-CM

## 2017-06-24 DIAGNOSIS — I482 Chronic atrial fibrillation: Secondary | ICD-10-CM | POA: Diagnosis not present

## 2017-06-24 DIAGNOSIS — Z9861 Coronary angioplasty status: Secondary | ICD-10-CM | POA: Diagnosis not present

## 2017-06-24 NOTE — Patient Instructions (Addendum)
Continue your current therapy  I will see you in 6 months.   

## 2017-06-26 LAB — CUP PACEART REMOTE DEVICE CHECK
Battery Impedance: 2797 Ohm
Battery Remaining Longevity: 23 mo
Brady Statistic RV Percent Paced: 99 %
Date Time Interrogation Session: 20190131190153
Implantable Lead Implant Date: 20100225
Implantable Lead Location: 753860
Implantable Lead Model: 5076
Implantable Pulse Generator Implant Date: 20100225
Lead Channel Impedance Value: 497 Ohm
Lead Channel Setting Pacing Pulse Width: 0.4 ms
Lead Channel Setting Sensing Sensitivity: 4 mV
MDC IDC LEAD IMPLANT DT: 20100225
MDC IDC LEAD LOCATION: 753859
MDC IDC MSMT BATTERY VOLTAGE: 2.74 V
MDC IDC MSMT LEADCHNL RA IMPEDANCE VALUE: 67 Ohm
MDC IDC MSMT LEADCHNL RV PACING THRESHOLD AMPLITUDE: 1.25 V
MDC IDC MSMT LEADCHNL RV PACING THRESHOLD PULSEWIDTH: 0.4 ms
MDC IDC SET LEADCHNL RV PACING AMPLITUDE: 2.5 V

## 2017-07-13 DIAGNOSIS — E1165 Type 2 diabetes mellitus with hyperglycemia: Secondary | ICD-10-CM | POA: Diagnosis not present

## 2017-07-13 DIAGNOSIS — I4891 Unspecified atrial fibrillation: Secondary | ICD-10-CM | POA: Diagnosis not present

## 2017-07-13 DIAGNOSIS — Z0000 Encounter for general adult medical examination without abnormal findings: Secondary | ICD-10-CM | POA: Diagnosis not present

## 2017-07-13 DIAGNOSIS — E039 Hypothyroidism, unspecified: Secondary | ICD-10-CM | POA: Diagnosis not present

## 2017-07-13 DIAGNOSIS — Z1389 Encounter for screening for other disorder: Secondary | ICD-10-CM | POA: Diagnosis not present

## 2017-07-13 DIAGNOSIS — H3530 Unspecified macular degeneration: Secondary | ICD-10-CM | POA: Diagnosis not present

## 2017-07-13 DIAGNOSIS — I5032 Chronic diastolic (congestive) heart failure: Secondary | ICD-10-CM | POA: Diagnosis not present

## 2017-07-13 DIAGNOSIS — I10 Essential (primary) hypertension: Secondary | ICD-10-CM | POA: Diagnosis not present

## 2017-07-13 DIAGNOSIS — F329 Major depressive disorder, single episode, unspecified: Secondary | ICD-10-CM | POA: Diagnosis not present

## 2017-09-10 ENCOUNTER — Encounter: Payer: PPO | Admitting: *Deleted

## 2017-09-17 ENCOUNTER — Encounter: Payer: Self-pay | Admitting: Cardiology

## 2017-09-22 DIAGNOSIS — E039 Hypothyroidism, unspecified: Secondary | ICD-10-CM | POA: Diagnosis not present

## 2017-09-24 ENCOUNTER — Ambulatory Visit (INDEPENDENT_AMBULATORY_CARE_PROVIDER_SITE_OTHER): Payer: PPO | Admitting: *Deleted

## 2017-09-24 DIAGNOSIS — I442 Atrioventricular block, complete: Secondary | ICD-10-CM | POA: Diagnosis not present

## 2017-09-25 ENCOUNTER — Encounter: Payer: Self-pay | Admitting: Cardiology

## 2017-09-25 NOTE — Progress Notes (Signed)
Remote pacemaker transmission.   

## 2017-09-29 LAB — CUP PACEART REMOTE DEVICE CHECK
Battery Impedance: 2973 Ohm
Battery Remaining Longevity: 22 mo
Implantable Lead Implant Date: 20100225
Implantable Lead Implant Date: 20100225
Implantable Lead Model: 5076
Implantable Pulse Generator Implant Date: 20100225
Lead Channel Impedance Value: 513 Ohm
Lead Channel Pacing Threshold Amplitude: 1.375 V
Lead Channel Setting Sensing Sensitivity: 2.8 mV
MDC IDC LEAD LOCATION: 753859
MDC IDC LEAD LOCATION: 753860
MDC IDC MSMT BATTERY VOLTAGE: 2.73 V
MDC IDC MSMT LEADCHNL RA IMPEDANCE VALUE: 67 Ohm
MDC IDC MSMT LEADCHNL RV PACING THRESHOLD PULSEWIDTH: 0.4 ms
MDC IDC SESS DTM: 20190516153612
MDC IDC SET LEADCHNL RV PACING AMPLITUDE: 2.5 V
MDC IDC SET LEADCHNL RV PACING PULSEWIDTH: 0.4 ms
MDC IDC STAT BRADY RV PERCENT PACED: 99 %

## 2017-10-27 ENCOUNTER — Other Ambulatory Visit: Payer: Self-pay | Admitting: Cardiovascular Disease

## 2017-11-10 DIAGNOSIS — D649 Anemia, unspecified: Secondary | ICD-10-CM | POA: Diagnosis not present

## 2017-11-10 DIAGNOSIS — I10 Essential (primary) hypertension: Secondary | ICD-10-CM | POA: Diagnosis not present

## 2017-11-10 DIAGNOSIS — I5032 Chronic diastolic (congestive) heart failure: Secondary | ICD-10-CM | POA: Diagnosis not present

## 2017-11-10 DIAGNOSIS — E785 Hyperlipidemia, unspecified: Secondary | ICD-10-CM | POA: Diagnosis not present

## 2017-11-10 DIAGNOSIS — E1165 Type 2 diabetes mellitus with hyperglycemia: Secondary | ICD-10-CM | POA: Diagnosis not present

## 2017-11-10 DIAGNOSIS — Z85038 Personal history of other malignant neoplasm of large intestine: Secondary | ICD-10-CM | POA: Diagnosis not present

## 2017-11-10 DIAGNOSIS — E039 Hypothyroidism, unspecified: Secondary | ICD-10-CM | POA: Diagnosis not present

## 2017-11-10 DIAGNOSIS — M1712 Unilateral primary osteoarthritis, left knee: Secondary | ICD-10-CM | POA: Diagnosis not present

## 2017-11-10 DIAGNOSIS — F329 Major depressive disorder, single episode, unspecified: Secondary | ICD-10-CM | POA: Diagnosis not present

## 2017-11-10 DIAGNOSIS — M810 Age-related osteoporosis without current pathological fracture: Secondary | ICD-10-CM | POA: Diagnosis not present

## 2017-11-10 DIAGNOSIS — I252 Old myocardial infarction: Secondary | ICD-10-CM | POA: Diagnosis not present

## 2017-11-10 DIAGNOSIS — I4891 Unspecified atrial fibrillation: Secondary | ICD-10-CM | POA: Diagnosis not present

## 2017-12-07 ENCOUNTER — Encounter (HOSPITAL_COMMUNITY): Payer: Self-pay | Admitting: Internal Medicine

## 2017-12-07 ENCOUNTER — Inpatient Hospital Stay (HOSPITAL_COMMUNITY): Payer: PPO

## 2017-12-07 ENCOUNTER — Emergency Department (HOSPITAL_COMMUNITY): Payer: PPO

## 2017-12-07 ENCOUNTER — Encounter (HOSPITAL_COMMUNITY)
Admission: EM | Disposition: A | Discharge status: Skilled Nursing Facility | Payer: Self-pay | Source: Home / Self Care | Attending: Internal Medicine

## 2017-12-07 ENCOUNTER — Inpatient Hospital Stay (HOSPITAL_COMMUNITY): Payer: PPO | Admitting: Certified Registered Nurse Anesthetist

## 2017-12-07 ENCOUNTER — Inpatient Hospital Stay (HOSPITAL_COMMUNITY)
Admission: EM | Admit: 2017-12-07 | Discharge: 2017-12-09 | DRG: 481 | Disposition: A | Discharge status: Skilled Nursing Facility | Payer: PPO | Attending: Internal Medicine | Admitting: Internal Medicine

## 2017-12-07 DIAGNOSIS — Z66 Do not resuscitate: Secondary | ICD-10-CM | POA: Diagnosis present

## 2017-12-07 DIAGNOSIS — F419 Anxiety disorder, unspecified: Secondary | ICD-10-CM | POA: Diagnosis not present

## 2017-12-07 DIAGNOSIS — M542 Cervicalgia: Secondary | ICD-10-CM | POA: Diagnosis not present

## 2017-12-07 DIAGNOSIS — I5032 Chronic diastolic (congestive) heart failure: Secondary | ICD-10-CM | POA: Diagnosis not present

## 2017-12-07 DIAGNOSIS — W19XXXA Unspecified fall, initial encounter: Secondary | ICD-10-CM | POA: Diagnosis not present

## 2017-12-07 DIAGNOSIS — S299XXA Unspecified injury of thorax, initial encounter: Secondary | ICD-10-CM | POA: Diagnosis not present

## 2017-12-07 DIAGNOSIS — R52 Pain, unspecified: Secondary | ICD-10-CM | POA: Diagnosis not present

## 2017-12-07 DIAGNOSIS — Z8781 Personal history of (healed) traumatic fracture: Secondary | ICD-10-CM

## 2017-12-07 DIAGNOSIS — Z888 Allergy status to other drugs, medicaments and biological substances status: Secondary | ICD-10-CM | POA: Diagnosis not present

## 2017-12-07 DIAGNOSIS — F329 Major depressive disorder, single episode, unspecified: Secondary | ICD-10-CM | POA: Diagnosis not present

## 2017-12-07 DIAGNOSIS — Z8249 Family history of ischemic heart disease and other diseases of the circulatory system: Secondary | ICD-10-CM | POA: Diagnosis not present

## 2017-12-07 DIAGNOSIS — S069X9A Unspecified intracranial injury with loss of consciousness of unspecified duration, initial encounter: Secondary | ICD-10-CM | POA: Diagnosis not present

## 2017-12-07 DIAGNOSIS — I739 Peripheral vascular disease, unspecified: Secondary | ICD-10-CM | POA: Diagnosis not present

## 2017-12-07 DIAGNOSIS — M25552 Pain in left hip: Secondary | ICD-10-CM | POA: Diagnosis not present

## 2017-12-07 DIAGNOSIS — I110 Hypertensive heart disease with heart failure: Secondary | ICD-10-CM | POA: Diagnosis present

## 2017-12-07 DIAGNOSIS — R609 Edema, unspecified: Secondary | ICD-10-CM | POA: Diagnosis not present

## 2017-12-07 DIAGNOSIS — Z85038 Personal history of other malignant neoplasm of large intestine: Secondary | ICD-10-CM

## 2017-12-07 DIAGNOSIS — I2510 Atherosclerotic heart disease of native coronary artery without angina pectoris: Secondary | ICD-10-CM

## 2017-12-07 DIAGNOSIS — I441 Atrioventricular block, second degree: Secondary | ICD-10-CM | POA: Diagnosis present

## 2017-12-07 DIAGNOSIS — I482 Chronic atrial fibrillation: Secondary | ICD-10-CM | POA: Diagnosis present

## 2017-12-07 DIAGNOSIS — E041 Nontoxic single thyroid nodule: Secondary | ICD-10-CM | POA: Diagnosis not present

## 2017-12-07 DIAGNOSIS — Z419 Encounter for procedure for purposes other than remedying health state, unspecified: Secondary | ICD-10-CM

## 2017-12-07 DIAGNOSIS — N390 Urinary tract infection, site not specified: Secondary | ICD-10-CM | POA: Diagnosis present

## 2017-12-07 DIAGNOSIS — I442 Atrioventricular block, complete: Secondary | ICD-10-CM | POA: Diagnosis present

## 2017-12-07 DIAGNOSIS — Z950 Presence of cardiac pacemaker: Secondary | ICD-10-CM | POA: Diagnosis not present

## 2017-12-07 DIAGNOSIS — S7292XA Unspecified fracture of left femur, initial encounter for closed fracture: Secondary | ICD-10-CM | POA: Diagnosis not present

## 2017-12-07 DIAGNOSIS — M80052A Age-related osteoporosis with current pathological fracture, left femur, initial encounter for fracture: Secondary | ICD-10-CM | POA: Diagnosis not present

## 2017-12-07 DIAGNOSIS — Z9861 Coronary angioplasty status: Secondary | ICD-10-CM

## 2017-12-07 DIAGNOSIS — E039 Hypothyroidism, unspecified: Secondary | ICD-10-CM | POA: Diagnosis present

## 2017-12-07 DIAGNOSIS — E785 Hyperlipidemia, unspecified: Secondary | ICD-10-CM | POA: Diagnosis present

## 2017-12-07 DIAGNOSIS — Z7901 Long term (current) use of anticoagulants: Secondary | ICD-10-CM

## 2017-12-07 DIAGNOSIS — K5730 Diverticulosis of large intestine without perforation or abscess without bleeding: Secondary | ICD-10-CM | POA: Diagnosis not present

## 2017-12-07 DIAGNOSIS — R278 Other lack of coordination: Secondary | ICD-10-CM | POA: Diagnosis not present

## 2017-12-07 DIAGNOSIS — S72002A Fracture of unspecified part of neck of left femur, initial encounter for closed fracture: Secondary | ICD-10-CM | POA: Diagnosis not present

## 2017-12-07 DIAGNOSIS — R2681 Unsteadiness on feet: Secondary | ICD-10-CM | POA: Diagnosis not present

## 2017-12-07 DIAGNOSIS — S72092A Other fracture of head and neck of left femur, initial encounter for closed fracture: Secondary | ICD-10-CM | POA: Diagnosis not present

## 2017-12-07 DIAGNOSIS — E119 Type 2 diabetes mellitus without complications: Secondary | ICD-10-CM | POA: Diagnosis not present

## 2017-12-07 DIAGNOSIS — Z9889 Other specified postprocedural states: Secondary | ICD-10-CM

## 2017-12-07 DIAGNOSIS — I10 Essential (primary) hypertension: Secondary | ICD-10-CM | POA: Diagnosis present

## 2017-12-07 DIAGNOSIS — I481 Persistent atrial fibrillation: Secondary | ICD-10-CM | POA: Diagnosis not present

## 2017-12-07 DIAGNOSIS — Z886 Allergy status to analgesic agent status: Secondary | ICD-10-CM

## 2017-12-07 DIAGNOSIS — K5900 Constipation, unspecified: Secondary | ICD-10-CM | POA: Diagnosis not present

## 2017-12-07 DIAGNOSIS — I4821 Permanent atrial fibrillation: Secondary | ICD-10-CM | POA: Diagnosis present

## 2017-12-07 DIAGNOSIS — R279 Unspecified lack of coordination: Secondary | ICD-10-CM | POA: Diagnosis not present

## 2017-12-07 DIAGNOSIS — M6281 Muscle weakness (generalized): Secondary | ICD-10-CM | POA: Diagnosis not present

## 2017-12-07 DIAGNOSIS — Z79890 Hormone replacement therapy (postmenopausal): Secondary | ICD-10-CM | POA: Diagnosis not present

## 2017-12-07 DIAGNOSIS — S199XXA Unspecified injury of neck, initial encounter: Secondary | ICD-10-CM | POA: Diagnosis not present

## 2017-12-07 DIAGNOSIS — Z471 Aftercare following joint replacement surgery: Secondary | ICD-10-CM | POA: Diagnosis not present

## 2017-12-07 DIAGNOSIS — R41841 Cognitive communication deficit: Secondary | ICD-10-CM | POA: Diagnosis not present

## 2017-12-07 DIAGNOSIS — M79605 Pain in left leg: Secondary | ICD-10-CM | POA: Diagnosis not present

## 2017-12-07 DIAGNOSIS — M25562 Pain in left knee: Secondary | ICD-10-CM

## 2017-12-07 DIAGNOSIS — I959 Hypotension, unspecified: Secondary | ICD-10-CM | POA: Diagnosis not present

## 2017-12-07 DIAGNOSIS — Z96642 Presence of left artificial hip joint: Secondary | ICD-10-CM | POA: Diagnosis not present

## 2017-12-07 DIAGNOSIS — Z743 Need for continuous supervision: Secondary | ICD-10-CM | POA: Diagnosis not present

## 2017-12-07 DIAGNOSIS — Z4801 Encounter for change or removal of surgical wound dressing: Secondary | ICD-10-CM | POA: Diagnosis not present

## 2017-12-07 DIAGNOSIS — S72045A Nondisplaced fracture of base of neck of left femur, initial encounter for closed fracture: Secondary | ICD-10-CM | POA: Diagnosis not present

## 2017-12-07 DIAGNOSIS — R0902 Hypoxemia: Secondary | ICD-10-CM | POA: Diagnosis not present

## 2017-12-07 DIAGNOSIS — R9431 Abnormal electrocardiogram [ECG] [EKG]: Secondary | ICD-10-CM | POA: Diagnosis not present

## 2017-12-07 HISTORY — PX: HIP PINNING,CANNULATED: SHX1758

## 2017-12-07 HISTORY — DX: Fracture of unspecified part of neck of unspecified femur, initial encounter for closed fracture: S72.009A

## 2017-12-07 LAB — URINALYSIS, ROUTINE W REFLEX MICROSCOPIC
Bilirubin Urine: NEGATIVE
Glucose, UA: NEGATIVE mg/dL
HGB URINE DIPSTICK: NEGATIVE
Ketones, ur: 20 mg/dL — AB
NITRITE: NEGATIVE
Protein, ur: NEGATIVE mg/dL
SPECIFIC GRAVITY, URINE: 1.015 (ref 1.005–1.030)
pH: 9 — ABNORMAL HIGH (ref 5.0–8.0)

## 2017-12-07 LAB — CBC WITH DIFFERENTIAL/PLATELET
ABS IMMATURE GRANULOCYTES: 0.1 10*3/uL (ref 0.0–0.1)
Basophils Absolute: 0 10*3/uL (ref 0.0–0.1)
Basophils Relative: 0 %
EOS ABS: 0.1 10*3/uL (ref 0.0–0.7)
Eosinophils Relative: 1 %
HEMATOCRIT: 39.9 % (ref 36.0–46.0)
HEMOGLOBIN: 13 g/dL (ref 12.0–15.0)
IMMATURE GRANULOCYTES: 1 %
LYMPHS ABS: 1.1 10*3/uL (ref 0.7–4.0)
LYMPHS PCT: 13 %
MCH: 30.5 pg (ref 26.0–34.0)
MCHC: 32.6 g/dL (ref 30.0–36.0)
MCV: 93.7 fL (ref 78.0–100.0)
MONOS PCT: 4 %
Monocytes Absolute: 0.4 10*3/uL (ref 0.1–1.0)
NEUTROS ABS: 7.1 10*3/uL (ref 1.7–7.7)
NEUTROS PCT: 81 %
Platelets: 269 10*3/uL (ref 150–400)
RBC: 4.26 MIL/uL (ref 3.87–5.11)
RDW: 12.8 % (ref 11.5–15.5)
WBC: 8.8 10*3/uL (ref 4.0–10.5)

## 2017-12-07 LAB — TYPE AND SCREEN
ABO/RH(D): O POS
ANTIBODY SCREEN: NEGATIVE

## 2017-12-07 LAB — COMPREHENSIVE METABOLIC PANEL
ALT: 18 U/L (ref 0–44)
AST: 25 U/L (ref 15–41)
Albumin: 3.5 g/dL (ref 3.5–5.0)
Alkaline Phosphatase: 78 U/L (ref 38–126)
Anion gap: 12 (ref 5–15)
BUN: 15 mg/dL (ref 8–23)
CO2: 22 mmol/L (ref 22–32)
Calcium: 9.1 mg/dL (ref 8.9–10.3)
Chloride: 106 mmol/L (ref 98–111)
Creatinine, Ser: 1.02 mg/dL — ABNORMAL HIGH (ref 0.44–1.00)
GFR, EST AFRICAN AMERICAN: 51 mL/min — AB (ref >60)
GFR, EST NON AFRICAN AMERICAN: 44 mL/min — AB (ref >60)
Glucose, Bld: 144 mg/dL — ABNORMAL HIGH (ref 70–99)
POTASSIUM: 3.9 mmol/L (ref 3.5–5.1)
SODIUM: 140 mmol/L (ref 135–145)
Total Bilirubin: 1.8 mg/dL — ABNORMAL HIGH (ref 0.3–1.2)
Total Protein: 7.3 g/dL (ref 6.5–8.1)

## 2017-12-07 LAB — HEMOGLOBIN A1C
HEMOGLOBIN A1C: 7.1 % — AB (ref 4.8–5.6)
MEAN PLASMA GLUCOSE: 157.07 mg/dL

## 2017-12-07 LAB — GLUCOSE, CAPILLARY
GLUCOSE-CAPILLARY: 132 mg/dL — AB (ref 70–99)
GLUCOSE-CAPILLARY: 151 mg/dL — AB (ref 70–99)
Glucose-Capillary: 147 mg/dL — ABNORMAL HIGH (ref 70–99)

## 2017-12-07 LAB — ABO/RH: ABO/RH(D): O POS

## 2017-12-07 LAB — PROTIME-INR
INR: 1.13
Prothrombin Time: 14.4 seconds (ref 11.4–15.2)

## 2017-12-07 SURGERY — FIXATION, FEMUR, NECK, PERCUTANEOUS, USING SCREW
Anesthesia: General | Site: Hip | Laterality: Left

## 2017-12-07 MED ORDER — LACTATED RINGERS IV SOLN
INTRAVENOUS | Status: DC
  Administered 2017-12-07 (×2): via INTRAVENOUS

## 2017-12-07 MED ORDER — ACETAMINOPHEN 325 MG PO TABS: 650.0000 mg | ORAL_TABLET | Freq: Four times a day (QID) | ORAL | 1 refills | Status: DC | PRN

## 2017-12-07 MED ORDER — SODIUM CHLORIDE 0.9 % IV SOLN
INTRAVENOUS | Status: AC
  Administered 2017-12-07: 23:00:00 via INTRAVENOUS

## 2017-12-07 MED ORDER — HYDROCODONE-ACETAMINOPHEN 5-325 MG PO TABS
1.0000 | ORAL_TABLET | ORAL | Status: DC | PRN
  Administered 2017-12-09: 1 via ORAL
  Filled 2017-12-07: qty 1

## 2017-12-07 MED ORDER — SODIUM CHLORIDE 0.9 % IV SOLN
1.0000 g | Freq: Once | INTRAVENOUS | Status: AC
  Administered 2017-12-07: 1 g via INTRAVENOUS
  Filled 2017-12-07: qty 10

## 2017-12-07 MED ORDER — INSULIN ASPART 100 UNIT/ML ~~LOC~~ SOLN
0.0000 [IU] | Freq: Three times a day (TID) | SUBCUTANEOUS | Status: DC
  Administered 2017-12-08: 1 [IU] via SUBCUTANEOUS
  Administered 2017-12-08: 3 [IU] via SUBCUTANEOUS
  Administered 2017-12-08: 2 [IU] via SUBCUTANEOUS
  Administered 2017-12-09: 3 [IU] via SUBCUTANEOUS
  Administered 2017-12-09: 2 [IU] via SUBCUTANEOUS

## 2017-12-07 MED ORDER — BISACODYL 10 MG RE SUPP: 10.0000 mg | Freq: Every day | RECTAL | Status: DC | PRN

## 2017-12-07 MED ORDER — METOCLOPRAMIDE HCL 5 MG PO TABS: 5.0000 mg | ORAL_TABLET | Freq: Three times a day (TID) | ORAL | Status: DC | PRN

## 2017-12-07 MED ORDER — NITROGLYCERIN 0.4 MG SL SUBL: 0.4000 mg | SUBLINGUAL_TABLET | SUBLINGUAL | Status: DC | PRN

## 2017-12-07 MED ORDER — APIXABAN 5 MG PO TABS
5.0000 mg | ORAL_TABLET | Freq: Two times a day (BID) | ORAL | Status: DC
  Administered 2017-12-08 – 2017-12-09 (×3): 5 mg via ORAL
  Filled 2017-12-07 (×3): qty 1

## 2017-12-07 MED ORDER — CHLORHEXIDINE GLUCONATE 4 % EX LIQD: 60.0000 mL | Freq: Once | CUTANEOUS | Status: DC

## 2017-12-07 MED ORDER — SODIUM CHLORIDE 0.9 % IV SOLN: 1.0000 g | INTRAVENOUS | Status: DC

## 2017-12-07 MED ORDER — LOSARTAN POTASSIUM 50 MG PO TABS: 100.0000 mg | ORAL_TABLET | Freq: Every day | ORAL | Status: DC

## 2017-12-07 MED ORDER — CEFAZOLIN SODIUM-DEXTROSE 2-4 GM/100ML-% IV SOLN
INTRAVENOUS | Status: AC
  Filled 2017-12-07: qty 100

## 2017-12-07 MED ORDER — FENTANYL CITRATE (PF) 250 MCG/5ML IJ SOLN
INTRAMUSCULAR | Status: DC | PRN
  Administered 2017-12-07: 75 ug via INTRAVENOUS

## 2017-12-07 MED ORDER — POVIDONE-IODINE 10 % EX SWAB: 2.0000 "application " | Freq: Once | CUTANEOUS | Status: DC

## 2017-12-07 MED ORDER — PHENYLEPHRINE 40 MCG/ML (10ML) SYRINGE FOR IV PUSH (FOR BLOOD PRESSURE SUPPORT)
PREFILLED_SYRINGE | INTRAVENOUS | Status: DC | PRN
  Administered 2017-12-07: 120 ug via INTRAVENOUS

## 2017-12-07 MED ORDER — DOCUSATE SODIUM 100 MG PO CAPS: 100.0000 mg | ORAL_CAPSULE | Freq: Two times a day (BID) | ORAL | Status: DC

## 2017-12-07 MED ORDER — SUGAMMADEX SODIUM 200 MG/2ML IV SOLN
INTRAVENOUS | Status: DC | PRN
  Administered 2017-12-07: 140 mg via INTRAVENOUS

## 2017-12-07 MED ORDER — SODIUM CHLORIDE 0.9 % IR SOLN
Status: DC | PRN
  Administered 2017-12-07: 1000 mL

## 2017-12-07 MED ORDER — MORPHINE SULFATE (PF) 4 MG/ML IV SOLN: 0.5000 mg | INTRAVENOUS | Status: DC | PRN

## 2017-12-07 MED ORDER — LORAZEPAM 2 MG/ML IJ SOLN
0.5000 mg | Freq: Once | INTRAMUSCULAR | Status: AC
  Administered 2017-12-07: 0.5 mg via INTRAVENOUS
  Filled 2017-12-07: qty 1

## 2017-12-07 MED ORDER — ONDANSETRON HCL 4 MG PO TABS: 4.0000 mg | ORAL_TABLET | Freq: Four times a day (QID) | ORAL | Status: DC | PRN

## 2017-12-07 MED ORDER — ACETAMINOPHEN 325 MG PO TABS
325.0000 mg | ORAL_TABLET | Freq: Four times a day (QID) | ORAL | Status: DC | PRN
  Administered 2017-12-09: 650 mg via ORAL
  Filled 2017-12-07: qty 2

## 2017-12-07 MED ORDER — FERROUS SULFATE 325 (65 FE) MG PO TABS
325.0000 mg | ORAL_TABLET | Freq: Three times a day (TID) | ORAL | Status: DC
  Administered 2017-12-08 – 2017-12-09 (×5): 325 mg via ORAL
  Filled 2017-12-07 (×5): qty 1

## 2017-12-07 MED ORDER — METHOCARBAMOL 500 MG PO TABS: 500.0000 mg | ORAL_TABLET | Freq: Four times a day (QID) | ORAL | Status: DC | PRN

## 2017-12-07 MED ORDER — MENTHOL 3 MG MT LOZG: 1.0000 | LOZENGE | OROMUCOSAL | Status: DC | PRN

## 2017-12-07 MED ORDER — ZOLPIDEM TARTRATE 5 MG PO TABS: 5.0000 mg | ORAL_TABLET | Freq: Every evening | ORAL | Status: DC | PRN

## 2017-12-07 MED ORDER — ALUM & MAG HYDROXIDE-SIMETH 200-200-20 MG/5ML PO SUSP: 30.0000 mL | ORAL | Status: DC | PRN

## 2017-12-07 MED ORDER — LEVOTHYROXINE SODIUM 88 MCG PO TABS
88.0000 ug | ORAL_TABLET | Freq: Every day | ORAL | Status: DC
  Administered 2017-12-08 – 2017-12-09 (×2): 88 ug via ORAL
  Filled 2017-12-07 (×2): qty 1

## 2017-12-07 MED ORDER — POLYETHYLENE GLYCOL 3350 17 G PO PACK: 17.0000 g | PACK | Freq: Every day | ORAL | Status: DC | PRN

## 2017-12-07 MED ORDER — CEFAZOLIN SODIUM-DEXTROSE 2-4 GM/100ML-% IV SOLN
2.0000 g | INTRAVENOUS | Status: AC
  Administered 2017-12-07: 2 g via INTRAVENOUS

## 2017-12-07 MED ORDER — FUROSEMIDE 40 MG PO TABS
40.0000 mg | ORAL_TABLET | Freq: Every day | ORAL | Status: DC
  Administered 2017-12-07 – 2017-12-09 (×3): 40 mg via ORAL
  Filled 2017-12-07 (×3): qty 1

## 2017-12-07 MED ORDER — ROCURONIUM BROMIDE 10 MG/ML (PF) SYRINGE
PREFILLED_SYRINGE | INTRAVENOUS | Status: DC | PRN
  Administered 2017-12-07: 20 mg via INTRAVENOUS

## 2017-12-07 MED ORDER — MORPHINE SULFATE (PF) 2 MG/ML IV SOLN
0.5000 mg | INTRAVENOUS | Status: DC | PRN
  Administered 2017-12-09: 0.5 mg via INTRAVENOUS
  Filled 2017-12-07: qty 1

## 2017-12-07 MED ORDER — MAGNESIUM CITRATE PO SOLN: 1.0000 | Freq: Once | ORAL | Status: DC | PRN

## 2017-12-07 MED ORDER — HYDROCODONE-ACETAMINOPHEN 7.5-325 MG PO TABS: 1.0000 | ORAL_TABLET | ORAL | Status: DC | PRN

## 2017-12-07 MED ORDER — FENTANYL CITRATE (PF) 100 MCG/2ML IJ SOLN
25.0000 ug | INTRAMUSCULAR | Status: DC | PRN
  Administered 2017-12-07 (×4): 25 ug via INTRAVENOUS

## 2017-12-07 MED ORDER — DOCUSATE SODIUM 100 MG PO CAPS
100.0000 mg | ORAL_CAPSULE | Freq: Two times a day (BID) | ORAL | Status: DC
Start: 2017-12-07 — End: 2017-12-09
  Administered 2017-12-07 – 2017-12-09 (×4): 100 mg via ORAL
  Filled 2017-12-07 (×4): qty 1

## 2017-12-07 MED ORDER — HYDROCODONE-ACETAMINOPHEN 5-325 MG PO TABS: 1.0000 | ORAL_TABLET | Freq: Four times a day (QID) | ORAL | Status: DC | PRN

## 2017-12-07 MED ORDER — METHOCARBAMOL 1000 MG/10ML IJ SOLN
500.0000 mg | Freq: Four times a day (QID) | INTRAVENOUS | Status: DC | PRN
  Filled 2017-12-07: qty 5

## 2017-12-07 MED ORDER — FENTANYL CITRATE (PF) 100 MCG/2ML IJ SOLN
INTRAMUSCULAR | Status: AC
  Administered 2017-12-07: 25 ug via INTRAVENOUS
  Filled 2017-12-07: qty 2

## 2017-12-07 MED ORDER — SODIUM CHLORIDE 0.9 % IV BOLUS
500.0000 mL | Freq: Once | INTRAVENOUS | Status: AC
  Administered 2017-12-07: 500 mL via INTRAVENOUS

## 2017-12-07 MED ORDER — PROPOFOL 10 MG/ML IV BOLUS
INTRAVENOUS | Status: DC | PRN
  Administered 2017-12-07: 50 mg via INTRAVENOUS

## 2017-12-07 MED ORDER — LIDOCAINE 2% (20 MG/ML) 5 ML SYRINGE
INTRAMUSCULAR | Status: DC | PRN
  Administered 2017-12-07: 60 mg via INTRAVENOUS

## 2017-12-07 MED ORDER — ACETAMINOPHEN 500 MG PO TABS
500.0000 mg | ORAL_TABLET | Freq: Four times a day (QID) | ORAL | Status: AC
  Administered 2017-12-07 – 2017-12-08 (×3): 500 mg via ORAL
  Filled 2017-12-07 (×3): qty 1

## 2017-12-07 MED ORDER — SIMVASTATIN 40 MG PO TABS
40.0000 mg | ORAL_TABLET | Freq: Every day | ORAL | Status: DC
  Administered 2017-12-07 – 2017-12-08 (×2): 40 mg via ORAL
  Filled 2017-12-07 (×2): qty 1

## 2017-12-07 MED ORDER — ONDANSETRON HCL 4 MG/2ML IJ SOLN
INTRAMUSCULAR | Status: DC | PRN
  Administered 2017-12-07: 4 mg via INTRAVENOUS

## 2017-12-07 MED ORDER — SODIUM CHLORIDE 0.9 % IV SOLN
INTRAVENOUS | Status: DC | PRN
  Administered 2017-12-07: 40 ug/min via INTRAVENOUS

## 2017-12-07 MED ORDER — PHENOL 1.4 % MT LIQD: 1.0000 | OROMUCOSAL | Status: DC | PRN

## 2017-12-07 MED ORDER — INSULIN ASPART 100 UNIT/ML ~~LOC~~ SOLN: 0.0000 [IU] | Freq: Every day | SUBCUTANEOUS | Status: DC

## 2017-12-07 MED ORDER — LOSARTAN POTASSIUM 50 MG PO TABS
100.0000 mg | ORAL_TABLET | Freq: Every day | ORAL | Status: DC
  Administered 2017-12-08 – 2017-12-09 (×2): 100 mg via ORAL
  Filled 2017-12-07 (×2): qty 2

## 2017-12-07 MED ORDER — ONDANSETRON HCL 4 MG/2ML IJ SOLN: 4.0000 mg | Freq: Four times a day (QID) | INTRAMUSCULAR | Status: DC | PRN

## 2017-12-07 MED ORDER — METOCLOPRAMIDE HCL 5 MG/ML IJ SOLN: 5.0000 mg | Freq: Three times a day (TID) | INTRAMUSCULAR | Status: DC | PRN

## 2017-12-07 MED ORDER — CEFAZOLIN SODIUM-DEXTROSE 2-4 GM/100ML-% IV SOLN
2.0000 g | Freq: Four times a day (QID) | INTRAVENOUS | Status: AC
  Administered 2017-12-07 – 2017-12-08 (×2): 2 g via INTRAVENOUS
  Filled 2017-12-07 (×2): qty 100

## 2017-12-07 SURGICAL SUPPLY — 68 items
APL SKNCLS STERI-STRIP NONHPOA (GAUZE/BANDAGES/DRESSINGS) ×2
BENZOIN TINCTURE PRP APPL 2/3 (GAUZE/BANDAGES/DRESSINGS) ×4 IMPLANT
BLADE SAW SGTL 18.5X63.X.64 HD (BLADE) ×4 IMPLANT
BNDG COHESIVE 6X5 TAN STRL LF (GAUZE/BANDAGES/DRESSINGS) ×3 IMPLANT
BRUSH FEMORAL CANAL (MISCELLANEOUS) IMPLANT
CLOSURE STERI-STRIP 1/2X4 (GAUZE/BANDAGES/DRESSINGS) ×1
CLSR STERI-STRIP ANTIMIC 1/2X4 (GAUZE/BANDAGES/DRESSINGS) ×3 IMPLANT
COVER BACK TABLE 24X17X13 BIG (DRAPES) IMPLANT
COVER SURGICAL LIGHT HANDLE (MISCELLANEOUS) ×7 IMPLANT
DRAPE IMP U-DRAPE 54X76 (DRAPES) ×4 IMPLANT
DRAPE INCISE IOBAN 66X45 STRL (DRAPES) IMPLANT
DRAPE ORTHO SPLIT 77X108 STRL (DRAPES) ×8
DRAPE SURG ORHT 6 SPLT 77X108 (DRAPES) ×4 IMPLANT
DRAPE U-SHAPE 47X51 STRL (DRAPES) ×4 IMPLANT
DRILL BIT 5/64 (BIT) ×4 IMPLANT
DRSG MEPILEX BORDER 4X12 (GAUZE/BANDAGES/DRESSINGS) IMPLANT
DRSG MEPILEX BORDER 4X4 (GAUZE/BANDAGES/DRESSINGS) ×3 IMPLANT
DRSG MEPILEX BORDER 4X8 (GAUZE/BANDAGES/DRESSINGS) IMPLANT
DURAPREP 26ML APPLICATOR (WOUND CARE) ×4 IMPLANT
ELECT BLADE 6.5 EXT (BLADE) IMPLANT
ELECT CAUTERY BLADE 6.4 (BLADE) ×4 IMPLANT
ELECT REM PT RETURN 9FT ADLT (ELECTROSURGICAL) ×4
ELECTRODE REM PT RTRN 9FT ADLT (ELECTROSURGICAL) ×2 IMPLANT
FACESHIELD WRAPAROUND (MASK) ×8 IMPLANT
FACESHIELD WRAPAROUND OR TEAM (MASK) ×2 IMPLANT
GLOVE BIOGEL PI ORTHO PRO SZ8 (GLOVE) ×2
GLOVE ORTHO TXT STRL SZ7.5 (GLOVE) ×4 IMPLANT
GLOVE PI ORTHO PRO STRL SZ8 (GLOVE) ×2 IMPLANT
GLOVE SURG ORTHO 8.0 STRL STRW (GLOVE) ×8 IMPLANT
GOWN STRL REUS W/ TWL XL LVL3 (GOWN DISPOSABLE) ×2 IMPLANT
GOWN STRL REUS W/TWL 2XL LVL3 (GOWN DISPOSABLE) ×4 IMPLANT
GOWN STRL REUS W/TWL XL LVL3 (GOWN DISPOSABLE) ×4
GUIDEWIRE THREAD TIP 3.2X9 (WIRE) ×9 IMPLANT
HANDPIECE INTERPULSE COAX TIP (DISPOSABLE)
KIT BASIN OR (CUSTOM PROCEDURE TRAY) ×4 IMPLANT
KIT TURNOVER KIT B (KITS) ×4 IMPLANT
MANIFOLD NEPTUNE II (INSTRUMENTS) ×4 IMPLANT
NDL SUT 6 .5 CRC .975X.05 MAYO (NEEDLE) IMPLANT
NEEDLE 22X1 1/2 (OR ONLY) (NEEDLE) ×4 IMPLANT
NEEDLE MAYO TAPER (NEEDLE)
NS IRRIG 1000ML POUR BTL (IV SOLUTION) ×4 IMPLANT
PACK TOTAL JOINT (CUSTOM PROCEDURE TRAY) ×4 IMPLANT
PACK UNIVERSAL I (CUSTOM PROCEDURE TRAY) ×4 IMPLANT
PAD ARMBOARD 7.5X6 YLW CONV (MISCELLANEOUS) ×8 IMPLANT
PILLOW ABDUCTION HIP (SOFTGOODS) ×4 IMPLANT
PRESSURIZER FEMORAL UNIV (MISCELLANEOUS) IMPLANT
RETRIEVER SUT HEWSON (MISCELLANEOUS) ×4 IMPLANT
SCREW CANN RVR CUT FLUT 90X16X (Screw) ×1 IMPLANT
SCREW CANN RVRS CUT FLT 85X32 (Screw) ×2 IMPLANT
SCREW CANNULATED 6.5X85MM (Screw) ×8 IMPLANT
SCREW CANNULATED 6.5X90 (Screw) ×4 IMPLANT
SET HNDPC FAN SPRY TIP SCT (DISPOSABLE) IMPLANT
SUT FIBERWIRE #2 38 REV NDL BL (SUTURE) ×8
SUT VIC AB 0 CT1 27 (SUTURE) ×4
SUT VIC AB 0 CT1 27XBRD ANBCTR (SUTURE) ×2 IMPLANT
SUT VIC AB 1 CT1 27 (SUTURE) ×8
SUT VIC AB 1 CT1 27XBRD ANBCTR (SUTURE) ×4 IMPLANT
SUT VIC AB 2-0 CT1 27 (SUTURE) ×4
SUT VIC AB 2-0 CT1 TAPERPNT 27 (SUTURE) ×1 IMPLANT
SUT VIC AB 3-0 FS2 27 (SUTURE) ×3 IMPLANT
SUT VIC AB 3-0 SH 8-18 (SUTURE) ×4 IMPLANT
SUTURE FIBERWR#2 38 REV NDL BL (SUTURE) ×4 IMPLANT
SYR CONTROL 10ML LL (SYRINGE) ×4 IMPLANT
TOWEL OR 17X24 6PK STRL BLUE (TOWEL DISPOSABLE) ×4 IMPLANT
TOWEL OR 17X26 10 PK STRL BLUE (TOWEL DISPOSABLE) ×4 IMPLANT
TOWER CARTRIDGE SMART MIX (DISPOSABLE) IMPLANT
TRAY FOLEY MTR SLVR 16FR STAT (SET/KITS/TRAYS/PACK) ×4 IMPLANT
WATER STERILE IRR 1000ML POUR (IV SOLUTION) ×16 IMPLANT

## 2017-12-07 NOTE — Op Note (Signed)
12/07/2017  5:44 PM  PATIENT:  [name]    PRE-OPERATIVE DIAGNOSIS: Valgus impacted left nondisplaced femoral neck fracture  POST-OPERATIVE DIAGNOSIS:  Same  PROCEDURE: Percutaneous skeletal fixation left femoral neck fracture  SURGEON:  Johnny Bridge, MD  PHYSICIAN ASSISTANT: Joya Gaskins, OPA-C, present and scrubbed throughout the case, critical for completion in a timely fashion, and for retraction, instrumentation, and closure.  ANESTHESIA:   General  ESTIMATED BLOOD LOSS: 25 mL.  PREOPERATIVE INDICATIONS:  [name] is a  84 y.o. female who fell and was found to have a diagnosis of Left femoral neck fx who elected for surgical management.  I discussed with the patient and the family preoperatively the options of hemiarthroplasty versus surgical fixation, and recommended pinning in order to minimize the physiologic impact of surgery, risks for delirium, and optimize long-term recovery.  The risks benefits and alternatives were discussed with the patient preoperatively including but not limited to the risks of infection, bleeding, nerve injury, cardiopulmonary complications, blood clots, malunion, nonunion, avascular necrosis, the need for revision surgery, the potential for conversion to hemiarthroplasty, among others, and the patient was willing to proceed.  OPERATIVE IMPLANTS: 7.3 mm cannulated screws x3 stainless steel from [employer]  OPERATIVE FINDINGS: Clinical osteoporosis with weak bone, proximal femur  OPERATIVE PROCEDURE: The patient was brought to the operating room and placed in supine position. IV antibiotics were given. General anesthesia administered. The patient was placed on the fracture table. The operative extremity was positioned, without any significant reduction maneuver and was prepped and draped in usual sterile fashion.  Time out was performed.  Small incision was made distal to the greater trochanter, and 3 guidewires were introduced Into an  inverted triangle configuration. The lengths were measured. The reduction was slightly valgus, and near-anatomic. I opened the cortex with a cannulated drill, and then placed the screws into position. Satisfactory fixation was achieved.  The wounds were irrigated copiously, and repaired with Vicryl with Steri-Strips and sterile gauze. There no complications and the patient tolerated the procedure well.  The patient will be weightbearing as tolerated, and will be on her baseline anticoagulation with Eliquis after discharge for DVT prophylaxis.

## 2017-12-07 NOTE — ED Notes (Signed)
OR staff called for pt; bedside report given in holding area; 2 daughters with pt per MD request; pt remains awake, alert, conversant - appropriate in conversation; indwelling foley catheter draining concentrated yellow urine; saline lock intact to LFA; hearing aide x1, clothes and eye glasses given to pt's daughter

## 2017-12-07 NOTE — ED Notes (Signed)
Attempted to put foley catheter in. Pt refused and stated "ya'll are trying to kill me." Pt kept squeezing legs together and crying. Will try again later.

## 2017-12-07 NOTE — ED Provider Notes (Signed)
[facility] EMERGENCY DEPARTMENT Provider Note   CSN: [id number] Arrival date & time: 12/07/17  4098     History   Chief Complaint Chief Complaint  Patient presents with  . Fall    HPI [name] is a 84 y.o. female.  HPI   She presents for evaluation of fall.  A neighbor found her on the floor, and called the patient's niece who arrived after EMS got there.  Patient's caregiver also arrived and was at the scene prior to transfer.  Patient lives alone, and is able to ambulate in her home, usually without an assistive device.  The patient is unable to give any history today.  She received fentanyl during transport for suspected left hip injury.  Patient's niece who is here states that the patient "cannot handle pain well.".  The patient has a healthcare power of attorney who is not currently here.  Level 5 caveat-altered mental status  Past Medical History:  Diagnosis Date  . Atrial fibrillation ([place])   . CAD (coronary artery disease)   . Colitis   . Colon cancer ([place])   . Diverticulosis of colon   . DJD (degenerative joint disease)   . DM (diabetes mellitus) ([place])   . Hip fracture ([place])   . HLD (hyperlipidemia)   . HTN (hypertension)   . Hypothyroidism   . Mobitz (type) II atrioventricular block   . Ventral hernia     Patient Active Problem List   Diagnosis Date Noted  . UTI (urinary tract infection) 12/07/2017  . Hip fracture ([place])   . Chronic anticoagulation 05/24/2015  . Permanent atrial fibrillation ([place]) 05/14/2015  . Chronic diastolic CHF (congestive heart failure) ([place]) 05/14/2015  . Acute respiratory failure ([place]) 05/14/2015  . Elevated troponin 05/14/2015  . Type II diabetes mellitus, well controlled ([place]) 05/14/2015  . Hypothyroid 05/14/2015  . Pacemaker 02/12/2012  . History of colon cancer 03/25/2011  . Edema 02/19/2011  . COLITIS 12/06/2008  . DIARRHEA 11/23/2008  . CAD S/P percutaneous coronary angioplasty 09/28/2008  .  VENTRAL HERNIA 09/28/2008  . SYNCOPE 09/28/2008  . HYPERLIPIDEMIA 08/10/2007  . Essential hypertension 08/10/2007  . INTERNAL HEMORRHOIDS 08/10/2007  . DEGENERATIVE JOINT DISEASE 08/10/2007  . DIVERTICULOSIS OF COLON 02/17/2007    Past Surgical History:  Procedure Laterality Date  . APPENDECTOMY    . CARDIAC CATHETERIZATION  08/22/1997  . CORONARY ANGIOPLASTY  07/03/2008  . HEMICOLECTOMY  10/07   right  . PACEMAKER INSERTION  07/06/08   MDT [id number] implanted for Mobitz II heart block by Dr Rayann Heman  . ROTATOR CUFF REPAIR    . TRANSTHORACIC ECHOCARDIOGRAM  06/29/2008   EF 55-60%     OB History   None      Home Medications    Prior to Admission medications   Medication Sig Start Date End Date Taking? Authorizing Provider  ELIQUIS 5 MG TABS tablet TAKE 1 TABLET BY MOUTH TWICE DAILY 10/27/17   [place], Peter M, MD  furosemide (LASIX) 40 MG tablet Take 1 tablet (40 mg total) by mouth daily. 11/10/12   [place], Peter M, MD  levothyroxine (SYNTHROID, LEVOTHROID) 75 MCG tablet Take 75 mcg by mouth daily before breakfast.    [provider]  losartan (COZAAR) 100 MG tablet Take 1 tablet (100 mg total) by mouth daily. 11/03/16   [place], Peter M, MD  nitroGLYCERIN (NITROSTAT) 0.4 MG SL tablet Place 0.4 mg under the tongue every 5 (five) minutes as needed for chest pain.  [provider]  simvastatin (ZOCOR) 40 MG tablet Take 1 tablet (40 mg total) by mouth at bedtime. 11/10/12   [place], Peter M, MD  Probiotic Product (ALIGN PO) Take 1 capsule by mouth daily.    02/19/11  [provider]    Family History Family History  Problem Relation Age of Onset  . Heart attack Father     Social History Social History   Tobacco Use  . Smoking status: Never Smoker  . Smokeless tobacco: Never Used  Substance Use Topics  . Alcohol use: No  . Drug use: No     Allergies   Diclofenac sodium; Hytrin [terazosin hcl]; Ibuprofen; Norvasc [amlodipine besylate]; and Plavix  [clopidogrel bisulfate]   Review of Systems Review of Systems  All other systems reviewed and are negative.    Physical Exam Updated Vital Signs BP 135/60   Pulse 60   Temp 98 F (36.7 C) (Axillary)   Resp (!) 24   SpO2 94%   Physical Exam  Constitutional: She appears well-developed. She appears distressed ([place]).  Elderly, frail  HENT:  Head: Normocephalic and atraumatic.  Right Ear: External ear normal.  Left Ear: External ear normal.  Eyes: Pupils are equal, round, and reactive to light. Conjunctivae and EOM are normal.  Neck: Normal range of motion and phonation normal. Neck supple.  Cardiovascular: Normal rate, regular rhythm and normal heart sounds.  Pulmonary/Chest: Effort normal and breath sounds normal. She exhibits no bony tenderness.  Abdominal: Soft. There is no tenderness.  Musculoskeletal: Normal range of motion.  No gross deformity of large joints arms or legs.  I am able to mobilize all joints of arms and legs without significant localizing pain.  Patient does not respond to request to move.  Neurological: She is alert. No cranial nerve deficit. She exhibits normal muscle tone.  Skin: Skin is warm, dry and intact.  Psychiatric:  Distracted, in pain  Nursing note and vitals reviewed.    ED Treatments / Results  Labs (all labs ordered are listed, but only abnormal results are displayed) Labs Reviewed  COMPREHENSIVE METABOLIC PANEL - Abnormal; Notable for the following components:      Result Value   Glucose, Bld 144 (*)    Creatinine, Ser 1.02 (*)    Total Bilirubin 1.8 (*)    GFR calc non Af Amer 44 (*)    GFR calc Af Amer 51 (*)    All other components within normal limits  URINALYSIS, ROUTINE W REFLEX MICROSCOPIC - Abnormal; Notable for the following components:   pH 9.0 (*)    Ketones, ur 20 (*)    Leukocytes, UA TRACE (*)    Bacteria, UA MANY (*)    All other components within normal limits  URINE CULTURE  CBC WITH DIFFERENTIAL/PLATELET   PROTIME-INR  TROPONIN I  HEMOGLOBIN A1C  TYPE AND SCREEN    EKG EKG Interpretation  Date/Time:  Monday December 07 2017 09:42:38 EDT Ventricular Rate:  64 PR Interval:    QRS Duration: 177 QT Interval:  506 QTC Calculation: 523 R Axis:   -80 Text Interpretation:  Junctional rhythm Nonspecific IVCD with LAD LVH with secondary repolarization abnormality since last tracing no significant change Confirmed by Daleen Bo [phone]) on 12/07/2017 9:47:58 AM  Repeat EKG at 12:32 PM-ventricular paced rhythm without other acute abnormalities.    EKG Interpretation  Date/Time:  Monday December 07 2017 12:32:43 EDT Ventricular Rate:  60 PR Interval:    QRS Duration: 155 QT Interval:  505 QTC Calculation: 505 R Axis:   -80 Text Interpretation:  Ventricular-paced rhythm No further analysis attempted due to paced rhythm Since last tracing of earlier today V-pacing can now be visualized, occult earlier Confirmed by Daleen Bo [phone]) on 12/07/2017 1:07:58 PM         Radiology Dg Chest 1 View  Result Date: 12/07/2017 CLINICAL DATA:  Patient status post fall today. EXAM: CHEST  1 VIEW COMPARISON:  PA and lateral chest 05/14/2015 and 04/25/2009. FINDINGS: Two lead pacing device in place. There is cardiomegaly without edema. Aortic atherosclerosis is noted. No pneumothorax or pleural fluid. No acute or focal bony abnormality. IMPRESSION: No acute disease. Cardiomegaly. Atherosclerosis. Electronically Signed   By: Inge Rise M.D.   On: 12/07/2017 11:43   Dg Pelvis 1-2 Views  Result Date: 12/07/2017 CLINICAL DATA:  Pain due to a fall today.  Initial encounter. EXAM: PELVIS - 1-2 VIEW COMPARISON:  None. FINDINGS: The patient has a nondisplaced fracture through approximately the mid aspect of the left femoral neck. No other acute bony or joint abnormality is identified. Mesh from hernia repair and atherosclerotic vascular disease are noted. IMPRESSION: Acute nondisplaced fracture mid aspect of  the left femoral neck. Electronically Signed   By: Inge Rise M.D.   On: 12/07/2017 11:44   Ct Head Wo Contrast  Result Date: 12/07/2017 CLINICAL DATA:  84 year old female with unwitnessed fall this morning. Unsure if loss of consciousness. Denies head and neck pain. Initial encounter. EXAM: CT HEAD WITHOUT CONTRAST CT CERVICAL SPINE WITHOUT CONTRAST TECHNIQUE: Multidetector CT imaging of the head and cervical spine was performed following the standard protocol without intravenous contrast. Multiplanar CT image reconstructions of the cervical spine were also generated. COMPARISON:  None. 04/18/2009 head CT. FINDINGS: CT brain findings. Brain: No intracranial hemorrhage or CT evidence of large acute infarct. Chronic microvascular changes. Moderate global atrophy. No intracranial mass lesion noted on this unenhanced exam. Vascular: Vascular calcifications Skull: No skull fracture. Remote right convexity craniotomy. Sinuses/Orbits: No acute orbital abnormality. Complete opacification left sphenoid sinus. Mucosal thickening right sphenoid sinus. Extensive thickening of bony landmarks of the sinuses and extensive prior sinus surgery. Moderate mucosal thickening surrounding ethmoid sinus region and maxillary sinuses. Other: Mastoid air cells and middle ear cavities are clear. CT CERVICAL SPINE FINDINGS Alignment: Mild curvature convex left with slight head tilt. Skull base and vertebrae: No cervical spine fracture. Soft tissues and spinal canal: No abnormal prevertebral soft tissue swelling. Disc levels: Cervical spondylotic changes without high-grade spinal stenosis. This includes degenerative changes C1-2 articulation greater on the left and facet degenerative changes bilaterally C3-4 and C4-5 level. Partial fusion C3-4 and dens with basion. Upper chest: No worrisome abnormality. Other: 2 x 2.5 cm left lobe with thyroid gland nodule. This can be followed by thyroid ultrasound if clinically desired. IMPRESSION:  1. No skull fracture or intracranial hemorrhage. 2. No cervical spine fracture or abnormal prevertebral soft tissue swelling. Degenerative changes throughout cervical spine. Mild curvature convex left with slight head tilt. 3. Extensive prior sinus surgery with changes of chronic sinusitis. 4. 2 x 2.5 cm left lobe thyroid nodule. This can be followed by thyroid ultrasound if clinically desired. 5. Chronic microvascular changes.  Atrophy. Electronically Signed   By: Genia Del M.D.   On: 12/07/2017 11:40   Ct Cervical Spine Wo Contrast  Result Date: 12/07/2017 CLINICAL DATA:  84 year old female with unwitnessed fall this morning. Unsure if loss of consciousness. Denies head and neck pain. Initial encounter. EXAM: CT HEAD  WITHOUT CONTRAST CT CERVICAL SPINE WITHOUT CONTRAST TECHNIQUE: Multidetector CT imaging of the head and cervical spine was performed following the standard protocol without intravenous contrast. Multiplanar CT image reconstructions of the cervical spine were also generated. COMPARISON:  None. 04/18/2009 head CT. FINDINGS: CT brain findings. Brain: No intracranial hemorrhage or CT evidence of large acute infarct. Chronic microvascular changes. Moderate global atrophy. No intracranial mass lesion noted on this unenhanced exam. Vascular: Vascular calcifications Skull: No skull fracture. Remote right convexity craniotomy. Sinuses/Orbits: No acute orbital abnormality. Complete opacification left sphenoid sinus. Mucosal thickening right sphenoid sinus. Extensive thickening of bony landmarks of the sinuses and extensive prior sinus surgery. Moderate mucosal thickening surrounding ethmoid sinus region and maxillary sinuses. Other: Mastoid air cells and middle ear cavities are clear. CT CERVICAL SPINE FINDINGS Alignment: Mild curvature convex left with slight head tilt. Skull base and vertebrae: No cervical spine fracture. Soft tissues and spinal canal: No abnormal prevertebral soft tissue swelling.  Disc levels: Cervical spondylotic changes without high-grade spinal stenosis. This includes degenerative changes C1-2 articulation greater on the left and facet degenerative changes bilaterally C3-4 and C4-5 level. Partial fusion C3-4 and dens with basion. Upper chest: No worrisome abnormality. Other: 2 x 2.5 cm left lobe with thyroid gland nodule. This can be followed by thyroid ultrasound if clinically desired. IMPRESSION: 1. No skull fracture or intracranial hemorrhage. 2. No cervical spine fracture or abnormal prevertebral soft tissue swelling. Degenerative changes throughout cervical spine. Mild curvature convex left with slight head tilt. 3. Extensive prior sinus surgery with changes of chronic sinusitis. 4. 2 x 2.5 cm left lobe thyroid nodule. This can be followed by thyroid ultrasound if clinically desired. 5. Chronic microvascular changes.  Atrophy. Electronically Signed   By: Genia Del M.D.   On: 12/07/2017 11:40    Procedures .Critical Care Performed by: Daleen Bo, MD Authorized by: Daleen Bo, MD   Critical care provider statement:    Critical care time (minutes):  35   Critical care start time:  12/07/2017 10:10 AM   Critical care time was exclusive of:  Separately billable procedures and treating other patients   Critical care was necessary to treat or prevent imminent or life-threatening deterioration of the following conditions: Hip fracture and urinary tract infection.   Critical care was time spent personally by me on the following activities:  Blood draw for specimens, development of treatment plan with patient or surrogate, discussions with consultants, evaluation of patient's response to treatment, examination of patient, obtaining history from patient or surrogate, ordering and performing treatments and interventions, ordering and review of laboratory studies, pulse oximetry, re-evaluation of patient's condition, review of old charts and ordering and review of  radiographic studies   (including critical care time)  Medications Ordered in ED Medications  sodium chloride 0.9 % bolus 500 mL (500 mLs Intravenous New Bag/Given 12/07/17 1253)  cefTRIAXone (ROCEPHIN) 1 g in sodium chloride 0.9 % 100 mL IVPB (1 g Intravenous New Bag/Given 12/07/17 1251)  HYDROcodone-acetaminophen (NORCO/VICODIN) 5-325 MG per tablet 1-2 tablet (has no administration in time range)  morphine 4 MG/ML injection 0.52 mg (has no administration in time range)  0.9 %  sodium chloride infusion (has no administration in time range)  methocarbamol (ROBAXIN) tablet 500 mg (has no administration in time range)    Or  methocarbamol (ROBAXIN) 500 mg in dextrose 5 % 50 mL IVPB (has no administration in time range)  zolpidem (AMBIEN) tablet 5 mg (has no administration in time range)  docusate sodium (  COLACE) capsule 100 mg (has no administration in time range)  insulin aspart (novoLOG) injection 0-9 Units (has no administration in time range)  insulin aspart (novoLOG) injection 0-5 Units (has no administration in time range)  LORazepam (ATIVAN) injection 0.5 mg (0.5 mg Intravenous Given 12/07/17 1050)     Initial Impression / Assessment and Plan / ED Course  I have reviewed the triage vital signs and the nursing notes.  Pertinent labs & imaging results that were available during my care of the patient were reviewed by me and considered in my medical decision making (see chart for details).  Clinical Course as of Dec 07 1304  Mon Dec 07, 2017  1228 Abnormal, elevated WBC and bacteria, probable UTI.  Urinalysis, Routine w reflex microscopic(!) [EW]  1228 Normal except elevated glucose and creatinine and total bilirubin.  Comprehensive metabolic panel(!) [EW]  7026 Normal  CBC with Differential [EW]  1233 Normal except left femoral neck fracture, images reviewed  DG Pelvis 1-2 Views [EW]  1233 No acute abnormality, images reviewed  DG Chest 1 View [EW]  1234 No acute intracranial  abnormality, images reviewed by me  CT Head Wo Contrast [EW]  1234 No fracture or dislocation,  left thyroid nodule, images reviewed by me, healthcare power of attorney informed  CT Cervical Spine Wo Contrast [EW]    Clinical Course User Index [EW] Daleen Bo, MD     Patient Vitals for the past 24 hrs:  BP Temp Temp src Pulse Resp SpO2  12/07/17 1215 135/60 - - 60 - 94 %  12/07/17 1200 (!) 126/55 - - 60 - 97 %  12/07/17 1145 134/62 - - (!) 59 - 98 %  12/07/17 1130 124/70 - - 61 - 92 %  12/07/17 1115 (!) 147/86 - - 63 - 100 %  12/07/17 1100 (!) 154/80 - - 62 - 93 %  12/07/17 1045 (!) 157/70 - - 60 - 97 %  12/07/17 1030 (!) 184/69 - - (!) 59 - 98 %  12/07/17 1015 (!) 172/94 - - 72 - 99 %  12/07/17 1000 (!) 169/140 - - 60 - 98 %  12/07/17 0947 (!) 161/94 98 F (36.7 C) Axillary 63 (!) 24 100 %  12/07/17 0945 (!) 175/72 99 F (37.2 C) Axillary 60 20 99 %    12:27 PM Reevaluation with update and discussion. After initial assessment and treatment, an updated evaluation reveals patient is much more alert and cooperative now she is able to partially set up with help.  With her hearing aid and she is able to communicate well.  She is alert and lucid, and isolates pain is only left hip.  Her healthcare power of attorney is here with her, and they were informed of the findings and plan.  Patient needs operative management for left hip fracture. Daleen Bo   Medical Decision Making: Left hip fracture, unknown mechanism for fall.  Patient overall stable, without evidence for acute infectious or metabolic processes.  CRITICAL CARE-yes Performed by: Daleen Bo   Nursing Notes Reviewed/ Care Coordinated Applicable Imaging Reviewed Interpretation of Laboratory Data incorporated into ED treatment  12:33 PM-requested callback from orthopedic surgery for assistance with operative management of left hip fracture.  12:36 PM-Consult complete with hospitalist. Patient case explained and  discussed.  She agrees to admit patient for further evaluation and treatment. Call ended at 12:42 PM    Final Clinical Impressions(s) / ED Diagnoses   Final diagnoses:  Closed fracture of left hip, initial  encounter [facility])  Left thyroid nodule  Urinary tract infection without hematuria, site unspecified    ED Discharge Orders    None       Daleen Bo, MD 12/07/17 1308

## 2017-12-07 NOTE — ED Notes (Signed)
Pt transported to radiology per rad tech via stretcher - remains awake, alert, oriented, conversant

## 2017-12-07 NOTE — ED Notes (Signed)
Ortho PA in to assess

## 2017-12-07 NOTE — Transfer of Care (Signed)
Immediate Anesthesia Transfer of Care Note  Patient: [name]  Procedure(s) Performed: CANNULATED HIP PINNING (Left Hip)  Patient Location: PACU  Anesthesia Type:General  Level of Consciousness: sedated  Airway & Oxygen Therapy: Patient Spontanous Breathing and Patient connected to nasal cannula oxygen  Post-op Assessment: Report given to RN and Post -op Vital signs reviewed and stable  Post vital signs: Reviewed and stable  Last Vitals:  Vitals Value Taken Time  BP 149/81 12/07/2017  6:15 PM  Temp    Pulse 62 12/07/2017  6:16 PM  Resp 15 12/07/2017  6:16 PM  SpO2 99 % 12/07/2017  6:16 PM  Vitals shown include unvalidated device data.  Last Pain:  Vitals:   12/07/17 1510  TempSrc:   PainSc: 0-No pain         Complications: No apparent anesthesia complications

## 2017-12-07 NOTE — ED Notes (Signed)
attempted to call report - charge nurse to call me back as she is looking at staffing pattern

## 2017-12-07 NOTE — H&P (Signed)
History and Physical    Cam Harnden TFT:[id number] DOB: [birth year] DOA: 12/07/2017  PCP: Leeroy Cha, MD Patient coming from: home  Chief Complaint: left hip fracture/rocephin  HPI: [name] is a delightful 84 y.o. female with medical history significant for diabetes, atrial fibrillation on chronic anticoagulation, CAD status post PTCA, pacemaker, hypertension presents to the emergency Department chief complaint of left hip pain after a fall. Initial evaluation reveals left hip fracture. [facility] are asked to admit  Information is obtained from the patient and family who is at the bedside as well as the chart. Patient states she remembers getting up out of bed this morning and before she even took the first steps she fell. She denies any dizziness syncope or near-syncope. She is unsure what caused the fall she did not hit her head or lose consciousness.she reports a neighbor found her on the floor and called the patient's family as well as EMS. Patient states she uses a walker outside of the home but feels steady on her feet inside the home. She denies any headache dizziness syncope or near-syncope. She denies chest pain palpitation shortness of breath lower extremity edema. She denies any dysuria hematuria frequency or urgency. She denies any diarrhea constipation melena bright red blood per rectum. She states she's been eating and drinking her normal amount. He reports pain control is "good" at the time of admission.  ED Course: in the emergency department she is afebrile hemodynamically stable and not hypoxic. She is provided with IV fluids and Rocephin as well as foot now  Review of Systems: As per HPI otherwise all other systems reviewed and are negative.   Ambulatory Status: ambulates with a walker outside the home. She lives alone. Has assistance with cleaning travel and Meals on Wheels  Past Medical History:  Diagnosis Date  . Atrial fibrillation ([place])     . CAD (coronary artery disease)   . Colitis   . Colon cancer ([place])   . Diverticulosis of colon   . DJD (degenerative joint disease)   . DM (diabetes mellitus) ([place])   . Hip fracture ([place])   . HLD (hyperlipidemia)   . HTN (hypertension)   . Hypothyroidism   . Mobitz (type) II atrioventricular block   . Ventral hernia     Past Surgical History:  Procedure Laterality Date  . APPENDECTOMY    . CARDIAC CATHETERIZATION  08/22/1997  . CORONARY ANGIOPLASTY  07/03/2008  . HEMICOLECTOMY  10/07   right  . PACEMAKER INSERTION  07/06/08   MDT [id number] implanted for Mobitz II heart block by Dr Rayann Heman  . ROTATOR CUFF REPAIR    . TRANSTHORACIC ECHOCARDIOGRAM  06/29/2008   EF 55-60%    Social History   Socioeconomic History  . Marital status: Widowed    Spouse name: Not on file  . Number of children: Not on file  . Years of education: Not on file  . Highest education level: Not on file  Occupational History  . Not on file  Social Needs  . Financial resource strain: Not on file  . Food insecurity:    Worry: Not on file    Inability: Not on file  . Transportation needs:    Medical: Not on file    Non-medical: Not on file  Tobacco Use  . Smoking status: Never Smoker  . Smokeless tobacco: Never Used  Substance and Sexual Activity  . Alcohol use: No  . Drug use: No  . Sexual activity: Not on  file  Lifestyle  . Physical activity:    Days per week: Not on file    Minutes per session: Not on file  . Stress: Not on file  Relationships  . Social connections:    Talks on phone: Not on file    Gets together: Not on file    Attends religious service: Not on file    Active member of club or organization: Not on file    Attends meetings of clubs or organizations: Not on file    Relationship status: Not on file  . Intimate partner violence:    Fear of current or ex partner: Not on file    Emotionally abused: Not on file    Physically abused: Not on file    Forced sexual activity: Not  on file  Other Topics Concern  . Not on file  Social History Narrative          She is a widow.  She lives by herself.  She did have 2           children who both died at young age.  Both of the children were invalids           from birth because of birth injuries but lived to the ages of 61 and 73           respectivel    Allergies  Allergen Reactions  . Diclofenac Sodium     UNKNOWN  . Hytrin [Terazosin Hcl]     UNKNOWN   . Ibuprofen     UNKNOWN   . Norvasc [Amlodipine Besylate]     UNKNOWN   . Plavix [Clopidogrel Bisulfate]     UNKNOWN     Family History  Problem Relation Age of Onset  . Heart attack Father     Prior to Admission medications   Medication Sig Start Date End Date Taking? Authorizing Provider  ELIQUIS 5 MG TABS tablet TAKE 1 TABLET BY MOUTH TWICE DAILY 10/27/17   [place], Peter M, MD  furosemide (LASIX) 40 MG tablet Take 1 tablet (40 mg total) by mouth daily. 11/10/12   [place], Peter M, MD  levothyroxine (SYNTHROID, LEVOTHROID) 75 MCG tablet Take 75 mcg by mouth daily before breakfast.    [provider]  losartan (COZAAR) 100 MG tablet Take 1 tablet (100 mg total) by mouth daily. 11/03/16   [place], Peter M, MD  nitroGLYCERIN (NITROSTAT) 0.4 MG SL tablet Place 0.4 mg under the tongue every 5 (five) minutes as needed for chest pain.    [provider]  simvastatin (ZOCOR) 40 MG tablet Take 1 tablet (40 mg total) by mouth at bedtime. 11/10/12   [place], Peter M, MD  Probiotic Product (ALIGN PO) Take 1 capsule by mouth daily.    02/19/11  [provider]    Physical Exam: Vitals:   12/07/17 1130 12/07/17 1145 12/07/17 1200 12/07/17 1215  BP: 124/70 134/62 (!) 126/55 135/60  Pulse: 61 (!) 59 60 60  Resp:      Temp:      TempSrc:      SpO2: 92% 98% 97% 94%     General:  Slightly pale slightly frail very hard of hearing in no acute distress Eyes:  PERRL, EOMI, normal lids, iris ENT:  grossly normal hearing, lips & tongue, mucous  membranes of her mouth are pink but dry. She has a hearing aid in her right ear Neck:  no LAD, masses or thyromegaly Cardiovascular:  RRR, no m/r/g.  No LE edema.  Respiratory:  CTA bilaterally, no w/r/r. Normal respiratory effort. Abdomen:  soft, ntnd, positive bowel sounds throughout no guarding or rebounding Skin:  no rash or induration seen on limited exam Musculoskeletal:  Left leg only slightly rotated. She has tenderness in that left hip decreased range of motion due to pain other joints without swelling/erythema Psychiatric:  grossly normal mood and affect, speech fluent and appropriate, AOx3 Neurologic:  Speech clear facial symmetry lateral grip 5 out of 5. Alert and oriented 3  Labs on Admission: I have personally reviewed following labs and imaging studies  CBC: Recent Labs  Lab 12/07/17 1007  WBC 8.8  NEUTROABS 7.1  HGB 13.0  HCT 39.9  MCV 93.7  PLT 536   Basic Metabolic Panel: Recent Labs  Lab 12/07/17 1007  NA 140  K 3.9  CL 106  CO2 22  GLUCOSE 144*  BUN 15  CREATININE 1.02*  CALCIUM 9.1   GFR: CrCl cannot be calculated (Unknown ideal weight.). Liver Function Tests: Recent Labs  Lab 12/07/17 1007  AST 25  ALT 18  ALKPHOS 78  BILITOT 1.8*  PROT 7.3  ALBUMIN 3.5   No results for input(s): LIPASE, AMYLASE in the last 168 hours. No results for input(s): AMMONIA in the last 168 hours. Coagulation Profile: No results for input(s): INR, PROTIME in the last 168 hours. Cardiac Enzymes: No results for input(s): CKTOTAL, CKMB, CKMBINDEX, TROPONINI in the last 168 hours. BNP (last 3 results) No results for input(s): PROBNP in the last 8760 hours. HbA1C: Recent Labs    12/07/17 1007  HGBA1C 7.1*   CBG: No results for input(s): GLUCAP in the last 168 hours. Lipid Profile: No results for input(s): CHOL, HDL, LDLCALC, TRIG, CHOLHDL, LDLDIRECT in the last 72 hours. Thyroid Function Tests: No results for input(s): TSH, T4TOTAL, FREET4, T3FREE,  THYROIDAB in the last 72 hours. Anemia Panel: No results for input(s): VITAMINB12, FOLATE, FERRITIN, TIBC, IRON, RETICCTPCT in the last 72 hours. Urine analysis:    Component Value Date/Time   COLORURINE YELLOW 12/07/2017 1041   APPEARANCEUR CLEAR 12/07/2017 1041   LABSPEC 1.015 12/07/2017 1041   PHURINE 9.0 (H) 12/07/2017 1041   GLUCOSEU NEGATIVE 12/07/2017 1041   HGBUR NEGATIVE 12/07/2017 [place] 12/07/2017 1041   KETONESUR 20 (A) 12/07/2017 1041   PROTEINUR NEGATIVE 12/07/2017 1041   UROBILINOGEN 0.2 03/07/2009 1020   NITRITE NEGATIVE 12/07/2017 1041   LEUKOCYTESUR TRACE (A) 12/07/2017 1041    Creatinine Clearance: CrCl cannot be calculated (Unknown ideal weight.).  Sepsis Labs: @LABRCNTIP (procalcitonin:4,lacticidven:4) )No results found for this or any previous visit (from the past 240 hour(s)).   Radiological Exams on Admission: Dg Chest 1 View  Result Date: 12/07/2017 CLINICAL DATA:  Patient status post fall today. EXAM: CHEST  1 VIEW COMPARISON:  PA and lateral chest 05/14/2015 and 04/25/2009. FINDINGS: Two lead pacing device in place. There is cardiomegaly without edema. Aortic atherosclerosis is noted. No pneumothorax or pleural fluid. No acute or focal bony abnormality. IMPRESSION: No acute disease. Cardiomegaly. Atherosclerosis. Electronically Signed   By: Inge Rise M.D.   On: 12/07/2017 11:43   Dg Pelvis 1-2 Views  Result Date: 12/07/2017 CLINICAL DATA:  Pain due to a fall today.  Initial encounter. EXAM: PELVIS - 1-2 VIEW COMPARISON:  None. FINDINGS: The patient has a nondisplaced fracture through approximately the mid aspect of the left femoral neck. No other acute bony or joint abnormality is identified. Mesh from hernia repair and atherosclerotic vascular disease  are noted. IMPRESSION: Acute nondisplaced fracture mid aspect of the left femoral neck. Electronically Signed   By: Inge Rise M.D.   On: 12/07/2017 11:44   Ct Head Wo  Contrast  Result Date: 12/07/2017 CLINICAL DATA:  84 year old female with unwitnessed fall this morning. Unsure if loss of consciousness. Denies head and neck pain. Initial encounter. EXAM: CT HEAD WITHOUT CONTRAST CT CERVICAL SPINE WITHOUT CONTRAST TECHNIQUE: Multidetector CT imaging of the head and cervical spine was performed following the standard protocol without intravenous contrast. Multiplanar CT image reconstructions of the cervical spine were also generated. COMPARISON:  None. 04/18/2009 head CT. FINDINGS: CT brain findings. Brain: No intracranial hemorrhage or CT evidence of large acute infarct. Chronic microvascular changes. Moderate global atrophy. No intracranial mass lesion noted on this unenhanced exam. Vascular: Vascular calcifications Skull: No skull fracture. Remote right convexity craniotomy. Sinuses/Orbits: No acute orbital abnormality. Complete opacification left sphenoid sinus. Mucosal thickening right sphenoid sinus. Extensive thickening of bony landmarks of the sinuses and extensive prior sinus surgery. Moderate mucosal thickening surrounding ethmoid sinus region and maxillary sinuses. Other: Mastoid air cells and middle ear cavities are clear. CT CERVICAL SPINE FINDINGS Alignment: Mild curvature convex left with slight head tilt. Skull base and vertebrae: No cervical spine fracture. Soft tissues and spinal canal: No abnormal prevertebral soft tissue swelling. Disc levels: Cervical spondylotic changes without high-grade spinal stenosis. This includes degenerative changes C1-2 articulation greater on the left and facet degenerative changes bilaterally C3-4 and C4-5 level. Partial fusion C3-4 and dens with basion. Upper chest: No worrisome abnormality. Other: 2 x 2.5 cm left lobe with thyroid gland nodule. This can be followed by thyroid ultrasound if clinically desired. IMPRESSION: 1. No skull fracture or intracranial hemorrhage. 2. No cervical spine fracture or abnormal prevertebral soft  tissue swelling. Degenerative changes throughout cervical spine. Mild curvature convex left with slight head tilt. 3. Extensive prior sinus surgery with changes of chronic sinusitis. 4. 2 x 2.5 cm left lobe thyroid nodule. This can be followed by thyroid ultrasound if clinically desired. 5. Chronic microvascular changes.  Atrophy. Electronically Signed   By: Genia Del M.D.   On: 12/07/2017 11:40   Ct Cervical Spine Wo Contrast  Result Date: 12/07/2017 CLINICAL DATA:  84 year old female with unwitnessed fall this morning. Unsure if loss of consciousness. Denies head and neck pain. Initial encounter. EXAM: CT HEAD WITHOUT CONTRAST CT CERVICAL SPINE WITHOUT CONTRAST TECHNIQUE: Multidetector CT imaging of the head and cervical spine was performed following the standard protocol without intravenous contrast. Multiplanar CT image reconstructions of the cervical spine were also generated. COMPARISON:  None. 04/18/2009 head CT. FINDINGS: CT brain findings. Brain: No intracranial hemorrhage or CT evidence of large acute infarct. Chronic microvascular changes. Moderate global atrophy. No intracranial mass lesion noted on this unenhanced exam. Vascular: Vascular calcifications Skull: No skull fracture. Remote right convexity craniotomy. Sinuses/Orbits: No acute orbital abnormality. Complete opacification left sphenoid sinus. Mucosal thickening right sphenoid sinus. Extensive thickening of bony landmarks of the sinuses and extensive prior sinus surgery. Moderate mucosal thickening surrounding ethmoid sinus region and maxillary sinuses. Other: Mastoid air cells and middle ear cavities are clear. CT CERVICAL SPINE FINDINGS Alignment: Mild curvature convex left with slight head tilt. Skull base and vertebrae: No cervical spine fracture. Soft tissues and spinal canal: No abnormal prevertebral soft tissue swelling. Disc levels: Cervical spondylotic changes without high-grade spinal stenosis. This includes degenerative  changes C1-2 articulation greater on the left and facet degenerative changes bilaterally C3-4 and C4-5 level. Partial  fusion C3-4 and dens with basion. Upper chest: No worrisome abnormality. Other: 2 x 2.5 cm left lobe with thyroid gland nodule. This can be followed by thyroid ultrasound if clinically desired. IMPRESSION: 1. No skull fracture or intracranial hemorrhage. 2. No cervical spine fracture or abnormal prevertebral soft tissue swelling. Degenerative changes throughout cervical spine. Mild curvature convex left with slight head tilt. 3. Extensive prior sinus surgery with changes of chronic sinusitis. 4. 2 x 2.5 cm left lobe thyroid nodule. This can be followed by thyroid ultrasound if clinically desired. 5. Chronic microvascular changes.  Atrophy. Electronically Signed   By: Genia Del M.D.   On: 12/07/2017 11:40    EKG: Independently reviewed. Ventricular-paced rhythm No further analysis attempted due to paced rhythm  Assessment/Plan Principal Problem:   Hip fracture (HCC) Active Problems:   UTI (urinary tract infection)   Essential hypertension   CAD S/P percutaneous coronary angioplasty   Permanent atrial fibrillation (HCC)   Chronic diastolic CHF (congestive heart failure) (HCC)   Type II diabetes mellitus, well controlled (HCC)   Chronic anticoagulation   Hypothyroid   #1. Left hip fracture. Status post fall. X-ray reveals left femoral neck fracture. Evaluated by orthopedic surgery who recommend surgery this afternoon or tomorrow. -Admit -Nothing by mouth until surgery scheduled -Pain management -Obtain an INR -EKG and chest x-ray as above -Further management defer to orthopedic  #2. Urinary tract infection. Likely contributed to fall. Urinalysis with trace leukocytes many bacteria, 21-50 WBC. One dose of Rocephin provided in the emergency department. She is afebrile hemodynamically stable and nontoxic appearing -Follow urine culture -Continue Rocephin -Monitor urine  output  #3.CAD status post PTCA 2010. Chart review indicates cardiology visit February 2019. Notes indicate she has moderate three-vessel disease is asymptomatic not on aspirin due to our requests. Plan to continue medical management. Patient denies any chest pain. EKG as noted above -Obtain a troponin -Continue home meds  #4. Chronic diastolic heart failure. Appears to be well compensated. Chart review indicates echo in 2018 revealed an EF do to 55% with moderate LVH and restrictive physiology. Also moderate MR and TR and biatrial enlargement. Moderate pulmonary hypertension. Medications include Lasix, Cozaar. -Holding Lasix for now -continue cozar -Daily weights -Intake and output  #5. Atrial fibrillation. Rate controlled. Home medications include Eliquis. EKG as noted above -hold eliquis for now -Resume  6.complete heart block status post pacemaker implant. Chart review indicates now prgrammed to VVI mode due to chronic afib. Heart indicates pacemaker normal November 2018  #7. Hypertension. Controlled. -Continue home meds  #8. Diabetes. Controlled. Hemoglobin A1c 7.1. Diet controlled -SSI for optimal control  DVT prophylaxis: scd  Code Status: dnr  Family Communication: nieces at bedside  Disposition Plan: snf  Consults called: [place]  Admission status: inpatient    Radene Gunning MD [facility]  If 7PM-7AM, please contact night-coverage [email or web address] Password [email or web address]  12/07/2017, 1:49 PM

## 2017-12-07 NOTE — Consult Note (Addendum)
Reason for Consult:Left hip fx Referring Physician: [name] is an 84 y.o. female.  HPI: [name] was at home early this morning when she fell. She's unsure what caused her to fall. She had immediate pain and could not get up. She was brought to the ED for evaluation. X-rays showed a left femoral neck fx and orthopedic surgery was consulted. She is very [facility].   Past Medical History:  Diagnosis Date  . Atrial fibrillation ([place])   . CAD (coronary artery disease)   . Colitis   . Colon cancer ([place])   . Diverticulosis of colon   . DJD (degenerative joint disease)   . DM (diabetes mellitus) ([place])   . Hip fracture ([place])   . HLD (hyperlipidemia)   . HTN (hypertension)   . Hypothyroidism   . Mobitz (type) II atrioventricular block   . Ventral hernia     Past Surgical History:  Procedure Laterality Date  . APPENDECTOMY    . CARDIAC CATHETERIZATION  08/22/1997  . CORONARY ANGIOPLASTY  07/03/2008  . HEMICOLECTOMY  10/07   right  . PACEMAKER INSERTION  07/06/08   MDT [id number] implanted for Mobitz II heart block by Dr Rayann Heman  . ROTATOR CUFF REPAIR    . TRANSTHORACIC ECHOCARDIOGRAM  06/29/2008   EF 55-60%    Family History  Problem Relation Age of Onset  . Heart attack Father     Social History:  reports that she has never smoked. She has never used smokeless tobacco. She reports that she does not drink alcohol or use drugs.  Allergies:  Allergies  Allergen Reactions  . Diclofenac Sodium     UNKNOWN  . Hytrin [Terazosin Hcl]     UNKNOWN   . Ibuprofen     UNKNOWN   . Norvasc [Amlodipine Besylate]     UNKNOWN   . Plavix [Clopidogrel Bisulfate]     UNKNOWN     Medications: I have reviewed the patient's current medications.  Results for orders placed or performed during the hospital encounter of 12/07/17 (from the past 48 hour(s))  Comprehensive metabolic panel     Status: Abnormal   Collection Time: 12/07/17 10:07 AM  Result Value Ref Range   Sodium 140 135 -  145 mmol/L   Potassium 3.9 3.5 - 5.1 mmol/L   Chloride 106 98 - 111 mmol/L   CO2 22 22 - 32 mmol/L   Glucose, Bld 144 (H) 70 - 99 mg/dL   BUN 15 8 - 23 mg/dL   Creatinine, Ser 1.02 (H) 0.44 - 1.00 mg/dL   Calcium 9.1 8.9 - 10.3 mg/dL   Total Protein 7.3 6.5 - 8.1 g/dL   Albumin 3.5 3.5 - 5.0 g/dL   AST 25 15 - 41 U/L   ALT 18 0 - 44 U/L   Alkaline Phosphatase 78 38 - 126 U/L   Total Bilirubin 1.8 (H) 0.3 - 1.2 mg/dL   GFR calc non Af Amer 44 (L) >60 mL/min   GFR calc Af Amer 51 (L) >60 mL/min    Comment: (NOTE) The eGFR has been calculated using the CKD EPI equation. This calculation has not been validated in all clinical situations. eGFR's persistently <60 mL/min signify possible Chronic Kidney Disease.    Anion gap 12 5 - 15    Comment: Performed at [facility] [address] [place], [place] [address]  CBC with Differential     Status: None   Collection Time: 12/07/17 10:07 AM  Result Value Ref Range   WBC 8.8 4.0 - 10.5 K/uL   RBC 4.26 3.87 - 5.11 MIL/uL   Hemoglobin 13.0 12.0 - 15.0 g/dL   HCT 39.9 36.0 - 46.0 %   MCV 93.7 78.0 - 100.0 fL   MCH 30.5 26.0 - 34.0 pg   MCHC 32.6 30.0 - 36.0 g/dL   RDW 12.8 11.5 - 15.5 %   Platelets 269 150 - 400 K/uL   Neutrophils Relative % 81 %   Neutro Abs 7.1 1.7 - 7.7 K/uL   Lymphocytes Relative 13 %   Lymphs Abs 1.1 0.7 - 4.0 K/uL   Monocytes Relative 4 %   Monocytes Absolute 0.4 0.1 - 1.0 K/uL   Eosinophils Relative 1 %   Eosinophils Absolute 0.1 0.0 - 0.7 K/uL   Basophils Relative 0 %   Basophils Absolute 0.0 0.0 - 0.1 K/uL   Immature Granulocytes 1 %   Abs Immature Granulocytes 0.1 0.0 - 0.1 K/uL    Comment: Performed at [facility], [address] [place], [place] [address]  Urinalysis, Routine w reflex microscopic     Status: Abnormal   Collection Time: 12/07/17 10:41 AM  Result Value Ref Range   Color, Urine YELLOW YELLOW   APPearance CLEAR CLEAR   Specific Gravity, Urine 1.015 1.005 - 1.030   pH  9.0 (H) 5.0 - 8.0   Glucose, UA NEGATIVE NEGATIVE mg/dL   Hgb urine dipstick NEGATIVE NEGATIVE   Bilirubin Urine NEGATIVE NEGATIVE   Ketones, ur 20 (A) NEGATIVE mg/dL   Protein, ur NEGATIVE NEGATIVE mg/dL   Nitrite NEGATIVE NEGATIVE   Leukocytes, UA TRACE (A) NEGATIVE   RBC / HPF 0-5 0 - 5 RBC/hpf   WBC, UA 21-50 0 - 5 WBC/hpf   Bacteria, UA MANY (A) NONE SEEN   Squamous Epithelial / LPF 0-5 0 - 5   Mucus PRESENT     Comment: Performed at [facility], [place] [address], [place], [place] [address]    Dg Chest 1 View  Result Date: 12/07/2017 CLINICAL DATA:  Patient status post fall today. EXAM: CHEST  1 VIEW COMPARISON:  PA and lateral chest 05/14/2015 and 04/25/2009. FINDINGS: Two lead pacing device in place. There is cardiomegaly without edema. Aortic atherosclerosis is noted. No pneumothorax or pleural fluid. No acute or focal bony abnormality. IMPRESSION: No acute disease. Cardiomegaly. Atherosclerosis. Electronically Signed   By: Inge Rise M.D.   On: 12/07/2017 11:43   Dg Pelvis 1-2 Views  Result Date: 12/07/2017 CLINICAL DATA:  Pain due to a fall today.  Initial encounter. EXAM: PELVIS - 1-2 VIEW COMPARISON:  None. FINDINGS: The patient has a nondisplaced fracture through approximately the mid aspect of the left femoral neck. No other acute bony or joint abnormality is identified. Mesh from hernia repair and atherosclerotic vascular disease are noted. IMPRESSION: Acute nondisplaced fracture mid aspect of the left femoral neck. Electronically Signed   By: Inge Rise M.D.   On: 12/07/2017 11:44   Ct Head Wo Contrast  Result Date: 12/07/2017 CLINICAL DATA:  84 year old female with unwitnessed fall this morning. Unsure if loss of consciousness. Denies head and neck pain. Initial encounter. EXAM: CT HEAD WITHOUT CONTRAST CT CERVICAL SPINE WITHOUT CONTRAST TECHNIQUE: Multidetector CT imaging of the head and cervical spine was performed following the standard protocol without  intravenous contrast. Multiplanar CT image reconstructions of the cervical spine were also generated. COMPARISON:  None. 04/18/2009 head CT. FINDINGS: CT brain findings. Brain: No  intracranial hemorrhage or CT evidence of large acute infarct. Chronic microvascular changes. Moderate global atrophy. No intracranial mass lesion noted on this unenhanced exam. Vascular: Vascular calcifications Skull: No skull fracture. Remote right convexity craniotomy. Sinuses/Orbits: No acute orbital abnormality. Complete opacification left sphenoid sinus. Mucosal thickening right sphenoid sinus. Extensive thickening of bony landmarks of the sinuses and extensive prior sinus surgery. Moderate mucosal thickening surrounding ethmoid sinus region and maxillary sinuses. Other: Mastoid air cells and middle ear cavities are clear. CT CERVICAL SPINE FINDINGS Alignment: Mild curvature convex left with slight head tilt. Skull base and vertebrae: No cervical spine fracture. Soft tissues and spinal canal: No abnormal prevertebral soft tissue swelling. Disc levels: Cervical spondylotic changes without high-grade spinal stenosis. This includes degenerative changes C1-2 articulation greater on the left and facet degenerative changes bilaterally C3-4 and C4-5 level. Partial fusion C3-4 and dens with basion. Upper chest: No worrisome abnormality. Other: 2 x 2.5 cm left lobe with thyroid gland nodule. This can be followed by thyroid ultrasound if clinically desired. IMPRESSION: 1. No skull fracture or intracranial hemorrhage. 2. No cervical spine fracture or abnormal prevertebral soft tissue swelling. Degenerative changes throughout cervical spine. Mild curvature convex left with slight head tilt. 3. Extensive prior sinus surgery with changes of chronic sinusitis. 4. 2 x 2.5 cm left lobe thyroid nodule. This can be followed by thyroid ultrasound if clinically desired. 5. Chronic microvascular changes.  Atrophy. Electronically Signed   By: Genia Del  M.D.   On: 12/07/2017 11:40   Ct Cervical Spine Wo Contrast  Result Date: 12/07/2017 CLINICAL DATA:  84 year old female with unwitnessed fall this morning. Unsure if loss of consciousness. Denies head and neck pain. Initial encounter. EXAM: CT HEAD WITHOUT CONTRAST CT CERVICAL SPINE WITHOUT CONTRAST TECHNIQUE: Multidetector CT imaging of the head and cervical spine was performed following the standard protocol without intravenous contrast. Multiplanar CT image reconstructions of the cervical spine were also generated. COMPARISON:  None. 04/18/2009 head CT. FINDINGS: CT brain findings. Brain: No intracranial hemorrhage or CT evidence of large acute infarct. Chronic microvascular changes. Moderate global atrophy. No intracranial mass lesion noted on this unenhanced exam. Vascular: Vascular calcifications Skull: No skull fracture. Remote right convexity craniotomy. Sinuses/Orbits: No acute orbital abnormality. Complete opacification left sphenoid sinus. Mucosal thickening right sphenoid sinus. Extensive thickening of bony landmarks of the sinuses and extensive prior sinus surgery. Moderate mucosal thickening surrounding ethmoid sinus region and maxillary sinuses. Other: Mastoid air cells and middle ear cavities are clear. CT CERVICAL SPINE FINDINGS Alignment: Mild curvature convex left with slight head tilt. Skull base and vertebrae: No cervical spine fracture. Soft tissues and spinal canal: No abnormal prevertebral soft tissue swelling. Disc levels: Cervical spondylotic changes without high-grade spinal stenosis. This includes degenerative changes C1-2 articulation greater on the left and facet degenerative changes bilaterally C3-4 and C4-5 level. Partial fusion C3-4 and dens with basion. Upper chest: No worrisome abnormality. Other: 2 x 2.5 cm left lobe with thyroid gland nodule. This can be followed by thyroid ultrasound if clinically desired. IMPRESSION: 1. No skull fracture or intracranial hemorrhage. 2. No  cervical spine fracture or abnormal prevertebral soft tissue swelling. Degenerative changes throughout cervical spine. Mild curvature convex left with slight head tilt. 3. Extensive prior sinus surgery with changes of chronic sinusitis. 4. 2 x 2.5 cm left lobe thyroid nodule. This can be followed by thyroid ultrasound if clinically desired. 5. Chronic microvascular changes.  Atrophy. Electronically Signed   By: Genia Del M.D.   On: 12/07/2017  11:40    Review of Systems  Constitutional: Negative for weight loss.  HENT: Negative for ear discharge, ear pain, hearing loss and tinnitus.   Eyes: Negative for blurred vision, double vision, photophobia and pain.  Respiratory: Negative for cough, sputum production and shortness of breath.   Cardiovascular: Negative for chest pain.  Gastrointestinal: Negative for abdominal pain, nausea and vomiting.  Genitourinary: Negative for dysuria, flank pain, frequency and urgency.  Musculoskeletal: Positive for joint pain (Left hip). Negative for back pain, falls, myalgias and neck pain.  Neurological: Negative for dizziness, tingling, sensory change, focal weakness, loss of consciousness and headaches.  Endo/Heme/Allergies: Does not bruise/bleed easily.  Psychiatric/Behavioral: Negative for depression, memory loss and substance abuse. The patient is not nervous/anxious.    Blood pressure 135/60, pulse 60, temperature 98 F (36.7 C), temperature source Axillary, resp. rate (!) 24, SpO2 94 %. Physical Exam  Constitutional: She appears well-developed and well-nourished. No distress.  HENT:  Head: Normocephalic and atraumatic.  Eyes: Conjunctivae are normal. Right eye exhibits no discharge. Left eye exhibits no discharge. No scleral icterus.  Neck: Normal range of motion.  Cardiovascular: Normal rate and regular rhythm.  Respiratory: Effort normal. No respiratory distress.  Musculoskeletal:  LLE No traumatic wounds, ecchymosis, or rash  Mod TTP hip and  knee  No knee or ankle effusion  Knee stable to varus/ valgus and anterior/posterior stress  Sens DPN, SPN, TN intact  Motor EHL, ext, flex, evers 5/5  DP 2+, PT 2+, No significant edema  Neurological: She is alert.  Skin: Skin is warm and dry. She is not diaphoretic.  Psychiatric: She has a normal mood and affect. Her behavior is normal.    Assessment/Plan: Fall Left femoral neck fx -- For hip hemi this afternoon or tomorrow by Dr. Mardelle Matte depending on medical clearance. Will keep NPO for now and will let pt/dtr know the plan.  Left knee pain -- Will get x-rays. Dtr notes pt has been c/o pain in that knee for several weeks. Anticoagulated on Eliquis -- Hold until after surgery CAD s/p pacemaker Mona, PA-C Orthopedic Surgery [phone] 12/07/2017, 12:57 PM   I reviewed her x-rays, examined her, and had an in-depth discussion with the family.  The options are nonsurgical, versus percutaneous pinning, versus hemiarthroplasty.  I recommended percutaneous pinning in order to minimize the physiologic impact, as well as the trauma of surgery.  The fracture does appear to be valgus impacted with minimal displacement.  I have discussed the potential that 15 to 20% of the time she may require subsequent hemiarthroplasty, but I would reserve that for a last resort.  She is a household ambulator, fairly minimal it sounds like, with significant risk factors.  The risks benefits and alternatives were discussed with the patient and the family including but not limited to the risks of nonoperative treatment, versus surgical intervention including infection, bleeding, nerve injury, malunion, nonunion, the need for revision surgery, hardware prominence, hardware failure, the need for hardware removal, blood clots, cardiopulmonary complications, morbidity, mortality, among others, and they were willing to proceed.

## 2017-12-07 NOTE — Anesthesia Procedure Notes (Signed)
Procedure Name: Intubation Date/Time: 12/07/2017 4:56 PM Performed by: Imagene Riches, CRNA Pre-anesthesia Checklist: Patient identified, Emergency Drugs available, Suction available and Patient being monitored Patient Re-evaluated:Patient Re-evaluated prior to induction Oxygen Delivery Method: Circle System Utilized Preoxygenation: Pre-oxygenation with 100% oxygen Induction Type: IV induction Ventilation: Mask ventilation without difficulty Laryngoscope Size: Miller and 3 Grade View: Grade I Tube type: Oral Tube size: 7.0 mm Number of attempts: 1 Airway Equipment and Method: Stylet and Oral airway Placement Confirmation: ETT inserted through vocal cords under direct vision,  positive ETCO2 and breath sounds checked- equal and bilateral Secured at: 22 cm Tube secured with: Tape Dental Injury: Teeth and Oropharynx as per pre-operative assessment

## 2017-12-07 NOTE — Anesthesia Preprocedure Evaluation (Addendum)
Anesthesia Evaluation  Patient identified by MRN, date of birth, ID band Patient awake    Reviewed: Allergy & Precautions, H&P , NPO status , Patient's Chart, lab work & pertinent test results  Airway Mallampati: III  TM Distance: >3 FB Neck ROM: Full    Dental no notable dental hx. (+) Poor Dentition, Dental Advisory Given   Pulmonary neg pulmonary ROS,    Pulmonary exam normal breath sounds clear to auscultation       Cardiovascular hypertension, Pt. on medications + CAD and +CHF  negative cardio ROS  + dysrhythmias Atrial Fibrillation + pacemaker  Rhythm:Regular Rate:Normal     Neuro/Psych negative neurological ROS  negative psych ROS   GI/Hepatic negative GI ROS, Neg liver ROS,   Endo/Other  negative endocrine ROSdiabetesHypothyroidism   Renal/GU negative Renal ROS  negative genitourinary   Musculoskeletal  (+) Arthritis , Osteoarthritis,    Abdominal   Peds  Hematology negative hematology ROS (+)   Anesthesia Other Findings   Reproductive/Obstetrics negative OB ROS                            Anesthesia Physical Anesthesia Plan  ASA: III  Anesthesia Plan: General   Post-op Pain Management:    Induction: Intravenous  PONV Risk Score and Plan: 4 or greater and Ondansetron and Treatment may vary due to age or medical condition  Airway Management Planned: Oral ETT  Additional Equipment:   Intra-op Plan:   Post-operative Plan: Extubation in OR  Informed Consent: I have reviewed the patients History and Physical, chart, labs and discussed the procedure including the risks, benefits and alternatives for the proposed anesthesia with the patient or authorized representative who has indicated his/her understanding and acceptance.   Dental advisory given  Plan Discussed with: CRNA  Anesthesia Plan Comments: (DNR discussed with patient and family. The patient would like for her  DNR to be maintained in the OR. No CPR. No defibrillation. No meds. I told them that the ETT was necessary for the procedure and we would place that.)       Anesthesia Quick Evaluation

## 2017-12-07 NOTE — ED Triage Notes (Signed)
Witnessed fall this am - unsure if mechanical or near syncopal episode; c/o pain to left hip, neck and upper back pain - on Eliquis per ems; was given Fentanyl 100 mcg IV PTA per EMS then an additional Fentanyl 100 mcg IV upon arrival to ED

## 2017-12-08 ENCOUNTER — Encounter (HOSPITAL_COMMUNITY): Payer: Self-pay | Admitting: Orthopedic Surgery

## 2017-12-08 ENCOUNTER — Other Ambulatory Visit: Payer: Self-pay

## 2017-12-08 DIAGNOSIS — E039 Hypothyroidism, unspecified: Secondary | ICD-10-CM

## 2017-12-08 DIAGNOSIS — N390 Urinary tract infection, site not specified: Secondary | ICD-10-CM

## 2017-12-08 LAB — BASIC METABOLIC PANEL
Anion gap: 8 (ref 5–15)
BUN: 13 mg/dL (ref 8–23)
CHLORIDE: 104 mmol/L (ref 98–111)
CO2: 24 mmol/L (ref 22–32)
CREATININE: 0.97 mg/dL (ref 0.44–1.00)
Calcium: 8 mg/dL — ABNORMAL LOW (ref 8.9–10.3)
GFR, EST AFRICAN AMERICAN: 55 mL/min — AB (ref >60)
GFR, EST NON AFRICAN AMERICAN: 47 mL/min — AB (ref >60)
Glucose, Bld: 128 mg/dL — ABNORMAL HIGH (ref 70–99)
Potassium: 3.4 mmol/L — ABNORMAL LOW (ref 3.5–5.1)
SODIUM: 136 mmol/L (ref 135–145)

## 2017-12-08 LAB — CBC
HCT: 33.8 % — ABNORMAL LOW (ref 36.0–46.0)
HEMOGLOBIN: 11 g/dL — AB (ref 12.0–15.0)
MCH: 30.9 pg (ref 26.0–34.0)
MCHC: 32.5 g/dL (ref 30.0–36.0)
MCV: 94.9 fL (ref 78.0–100.0)
PLATELETS: 224 10*3/uL (ref 150–400)
RBC: 3.56 MIL/uL — AB (ref 3.87–5.11)
RDW: 13 % (ref 11.5–15.5)
WBC: 9.9 10*3/uL (ref 4.0–10.5)

## 2017-12-08 LAB — GLUCOSE, CAPILLARY
GLUCOSE-CAPILLARY: 127 mg/dL — AB (ref 70–99)
GLUCOSE-CAPILLARY: 212 mg/dL — AB (ref 70–99)
Glucose-Capillary: 189 mg/dL — ABNORMAL HIGH (ref 70–99)

## 2017-12-08 NOTE — Anesthesia Postprocedure Evaluation (Signed)
Anesthesia Post Note  Patient: [name]  Procedure(s) Performed: CANNULATED HIP PINNING (Left Hip)     Patient location during evaluation: PACU Anesthesia Type: General Level of consciousness: awake and alert Pain management: pain level controlled Vital Signs Assessment: post-procedure vital signs reviewed and stable Respiratory status: spontaneous breathing, nonlabored ventilation, respiratory function stable and patient connected to nasal cannula oxygen Cardiovascular status: blood pressure returned to baseline and stable Postop Assessment: no apparent nausea or vomiting Anesthetic complications: no    Last Vitals:  Vitals:   12/07/17 2328 12/08/17 0535  BP: 126/84 132/68  Pulse: 60 72  Resp: 12   Temp: 36.6 C 36.6 C  SpO2: 95% 95%    Last Pain:  Vitals:   12/08/17 0535  TempSrc: Oral  PainSc:                  Armstrong Creasy,W. EDMOND

## 2017-12-08 NOTE — Progress Notes (Signed)
Physical Therapy Evaluation Patient Details Name: [name] MRN: [id number] DOB: 12-Apr-1917 Today's Date: 12/08/2017   History of Present Illness  Pt is a 84 y/o female with a PMH significant for hypothyroidism, HTN, DM, colon CA, CAD, and afib on Eliquis who presents with a left femoral neck fracture s/p mechanical fall. Pt is now s/p percutaneous skeletal fixation of the left femoral neck fracture on 12/07/17.  Clinical Impression  Pt presented OOB in chair upon arrival with friends in room, with no complaints of left hip pain just "sore". Pt slightly impulsive to get up and walk as she had been sitting in chair for a while prior to arrival. Min assist needed for transfers for safety awareness and technique. Pt ambulated with min guard, requiring multimodal cues for proper use of RW and to remain upright. Pt got fatigued in hallway while ambulating and also reported slight left hip pain and a chair follow was utilized. Therapeutic exercise performed in chair with good pain tolerance, but required demonstration for each as unable to fully understand how to perform glut sets. Skilled PT needed at this time to continue to improve overall safety and independence with functional mobility.     Follow Up Recommendations SNF    Equipment Recommendations       Recommendations for Other Services       Precautions / Restrictions Restrictions Weight Bearing Restrictions: Yes LLE Weight Bearing: Weight bearing as tolerated      Mobility  Bed Mobility               General bed mobility comments: Pt OOB in chair upon arrival and at end of session  Transfers Overall transfer level: Needs assistance Equipment used: Rolling walker (2 wheeled) Transfers: Sit to/from Stand Sit to Stand: Min assist         General transfer comment: Pt needs verbal cues for saferty awareness and technique. Cue to push off of chair instead of pull up with RW. Increased time needed for powering up. Pt  initially bent over RW until cued to stand upright.  Ambulation/Gait Ambulation/Gait assistance: Min guard Gait Distance (Feet): 35 Feet Assistive device: Rolling walker (2 wheeled) Gait Pattern/deviations: Step-to pattern;Step-through pattern;Decreased step length - left;Decreased stance time - left;Antalgic;Trunk flexed Gait velocity: decreased Gait velocity interpretation: <1.8 ft/sec, indicate of risk for recurrent falls General Gait Details: Pt required multimodal cues in order to remain within RW and upright with eyes forward.  Stairs            Wheelchair Mobility    Modified Rankin (Stroke Patients Only)       Balance Overall balance assessment: Needs assistance Sitting-balance support: No upper extremity supported;Feet supported Sitting balance-Leahy Scale: Fair Sitting balance - Comments: Pt sat edge of chair without back support with no signs of LOB or need for assist.    Standing balance support: Bilateral upper extremity supported Standing balance-Leahy Scale: Poor Standing balance comment: Pt required bil arm support in standing with RW and cues to prevent pt from standing hunched forward.                              Pertinent Vitals/Pain Pain Assessment: Faces Faces Pain Scale: Hurts a little bit Pain Location: Left hip Pain Descriptors / Indicators: Sore;Grimacing Pain Intervention(s): Limited activity within patient's tolerance;Monitored during session    Home Living Family/patient expects to be discharged to:: Skilled nursing facility  Additional Comments: Pt was living alone prior to fall. Neighbors were in the room checking on her prior to the start of physical therapy. Husband is deceased.    Prior Function Level of Independence: Independent with assistive device(s)         Comments: Pt was using a cane or stick on occasions to get around with in home and community     Hand Dominance         Extremity/Trunk Assessment                Communication   Communication: No difficulties  Cognition Arousal/Alertness: Awake/alert Behavior During Therapy: WFL for tasks assessed/performed Overall Cognitive Status: Within Functional Limits for tasks assessed                                        General Comments      Exercises General Exercises - Lower Extremity Gluteal Sets: Seated;5 reps(with legs reclined; d/c as pt didn't understand how to do it) Long Arc Quad: 10 reps;Left;Seated Hip Flexion/Marching: 10 reps;Seated   Assessment/Plan    PT Assessment Patient needs continued PT services  PT Problem List Decreased strength;Decreased range of motion;Decreased activity tolerance;Decreased balance;Decreased mobility;Decreased coordination;Decreased knowledge of use of DME;Decreased safety awareness;Pain       PT Treatment Interventions DME instruction;Gait training;Functional mobility training;Therapeutic activities;Therapeutic exercise;Balance training;Patient/family education    PT Goals (Current goals can be found in the Care Plan section)       Frequency Min 3X/week   Barriers to discharge        Co-evaluation               AM-PAC PT "6 Clicks" Daily Activity  Outcome Measure Difficulty turning over in bed (including adjusting bedclothes, sheets and blankets)?: Unable Difficulty moving from lying on back to sitting on the side of the bed? : Unable Difficulty sitting down on and standing up from a chair with arms (e.g., wheelchair, bedside commode, etc,.)?: Unable Help needed moving to and from a bed to chair (including a wheelchair)?: A Little Help needed walking in hospital room?: A Lot Help needed climbing 3-5 steps with a railing? : A Lot 6 Click Score: 10    End of Session Equipment Utilized During Treatment: Gait belt Activity Tolerance: Patient limited by fatigue;Patient limited by pain Patient left: in chair;with call  bell/phone within reach Nurse Communication: Mobility status PT Visit Diagnosis: Other abnormalities of gait and mobility (R26.89);Muscle weakness (generalized) (M62.81);History of falling (Z91.81);Pain Pain - Right/Left: Left Pain - part of body: Hip    Time:  -      Charges:   PT Evaluation $PT Eval Moderate Complexity: 1 Mod PT Treatments $Gait Training: 8-22 mins        Einar Crow, [place]  Student Physical Therapist Acute Rehab [phone]   Einar Crow 12/08/2017, 2:48 PM

## 2017-12-08 NOTE — Progress Notes (Signed)
     Subjective:  Patient reports pain as mild.  Sitting in bed talking on the phone.   Objective:   VITALS:   Vitals:   12/07/17 1945 12/07/17 2007 12/07/17 2328 12/08/17 0535  BP: (!) 149/68 (!) 145/53 126/84 132/68  Pulse: (!) 59 63 60 72  Resp: 10  12   Temp: 97.7 F (36.5 C) (!) 97.5 F (36.4 C) 97.8 F (36.6 C) 97.9 F (36.6 C)  TempSrc:  Oral  Oral  SpO2: 92% 100% 95% 95%  Weight:    77.9 kg (171 lb 11.8 oz)    Neurologically intact Dorsiflexion/Plantar flexion intact Incision: no drainage   Lab Results  Component Value Date   WBC 9.9 12/08/2017   HGB 11.0 (L) 12/08/2017   HCT 33.8 (L) 12/08/2017   MCV 94.9 12/08/2017   PLT 224 12/08/2017   BMET    Component Value Date/Time   NA 136 12/08/2017 0514   K 3.4 (L) 12/08/2017 0514   CL 104 12/08/2017 0514   CO2 24 12/08/2017 0514   GLUCOSE 128 (H) 12/08/2017 0514   BUN 13 12/08/2017 0514   CREATININE 0.97 12/08/2017 0514   CREATININE 1.01 (H) 10/10/2015 1424   CALCIUM 8.0 (L) 12/08/2017 0514   GFRNONAA 47 (L) 12/08/2017 0514   GFRAA 55 (L) 12/08/2017 0514     Assessment/Plan: 1 Day Post-Op   Principal Problem:   Fracture of femoral neck, left (HCC) Active Problems:   Essential hypertension   CAD S/P percutaneous coronary angioplasty   Permanent atrial fibrillation (HCC)   Chronic diastolic CHF (congestive heart failure) (HCC)   Type II diabetes mellitus, well controlled (HCC)   Hypothyroid   Chronic anticoagulation   UTI (urinary tract infection)   Advance diet Up with therapy Discharge to SNF Lovenox, WBAT, RTC with me in 2 weeks, tylenol for pain.     [name] 12/08/2017, 10:38 AM   [name] Bond, MD Cell [phone]

## 2017-12-08 NOTE — Plan of Care (Signed)
  Problem: Education: Goal: Knowledge of General Education information will improve Description Including pain rating scale, medication(s)/side effects and non-pharmacologic comfort measures Outcome: Progressing Note:  POC and orders reviewed with pt.   

## 2017-12-08 NOTE — Progress Notes (Signed)
PROGRESS NOTE    [name]  NID:[id number] DOB: 11/22/1916 DOA: 12/07/2017 PCP: Leeroy Cha, MD   Brief Narrative:  HPI On 12/07/2017 by Ms. Dyanne Carrel, NP [name] is a delightful 84 y.o. female with medical history significant for diabetes, atrial fibrillation on chronic anticoagulation, CAD status post PTCA, pacemaker, hypertension presents to the emergency Department chief complaint of left hip pain after a fall. Initial evaluation reveals left hip fracture. [facility] are asked to admit  Information is obtained from the patient and family who is at the bedside as well as the chart. Patient states she remembers getting up out of bed this morning and before she even took the first steps she fell. She denies any dizziness syncope or near-syncope. She is unsure what caused the fall she did not hit her head or lose consciousness.she reports a neighbor found her on the floor and called the patient's family as well as EMS. Patient states she uses a walker outside of the home but feels steady on her feet inside the home. She denies any headache dizziness syncope or near-syncope. She denies chest pain palpitation shortness of breath lower extremity edema. She denies any dysuria hematuria frequency or urgency. She denies any diarrhea constipation melena bright red blood per rectum. She states she's been eating and drinking her normal amount. He reports pain control is "good" at the time of admission.  Interim history Admitted for left hip fracture status post surgery.  Currently pending PT and OT consultations.  Also found to have UTI, currently on Rocephin. Assessment & Plan   Left femoral neck fracture status post fall -Noted on x-ray -Orthopedic surgery consulted and appreciated, status post percutaneous skeletal fixation left femoral neck fracture -Pending PT and OT evaluations, will likely need SNF placement -Continue pain control  Urinary tract  infection -Possibly contributed to patient's fall UA showed trace leukocytes, many bacteria, 21-50 WBC -Urine culture >100k klebsiella pneumonia, >100k Ecoli; pending susceptibilities -Continue Rocephin  Coronary artery disease -Status post PTCA in 2010 -Patient follows with cardiology -Currently on Eliquis, cozaar, statin -Currently denies chest pain  Atrial fibrillation, chronic -Continue Eliquis -has pacemaker  Chronic diastolic heart failure -Currently appears to be compensated and euvolemic -Echocardiogram 05/13/2017: EF 50-55%, moderate LVH and restrictive physiology, moderate MR and TR, biatrial enlargement -Lasix currently held, continue Cozaar -Monitor intake and output, daily weights  Complete heart block status post pacemaker -Noted to be normal in November 2018 -per EKG, ventricularly paced  Essential hypertension -Continue Cozaar  Diabetes mellitus, type II -Diet controlled at home -Hemoglobin A1c 7.1 -Continue insulin sliding scale and CBG monitoring  Hypothyroidism -Continue Synthroid  DVT Prophylaxis  Eliquis  Code Status: DNR  Family Communication: Family at bedside  Disposition Plan: Admitted.  Pending PT and OT evaluations.  Suspect patient will need SNF.  Social work consulted.  Consultants Orthopedic surgery  Procedures  Percutaneous skeletal fixation of left femoral neck fracture  Antibiotics   Anti-infectives (From admission, onward)   Start     Dose/Rate Route Frequency Ordered Stop   12/08/17 1300  cefTRIAXone (ROCEPHIN) 1 g in sodium chloride 0.9 % 100 mL IVPB  Status:  Discontinued     1 g 200 mL/hr over 30 Minutes Intravenous Every 24 hours 12/07/17 1329 12/07/17 1410   12/08/17 0600  ceFAZolin (ANCEF) IVPB 2g/100 mL premix     2 g 200 mL/hr over 30 Minutes Intravenous On call to O.R. 12/07/17 1526 12/07/17 1720   12/07/17 2330  ceFAZolin (  ANCEF) IVPB 2g/100 mL premix     2 g 200 mL/hr over 30 Minutes Intravenous Every 6 hours  12/07/17 2020 12/08/17 0612   12/07/17 1529  ceFAZolin (ANCEF) 2-4 GM/100ML-% IVPB    Note to Pharmacy:  Henrine Screws   : cabinet override      12/07/17 1529 12/07/17 1720   12/07/17 1230  cefTRIAXone (ROCEPHIN) 1 g in sodium chloride 0.9 % 100 mL IVPB     1 g 200 mL/hr over 30 Minutes Intravenous  Once 12/07/17 1229 12/07/17 1331      Subjective:   [name] seen and examined today.  Patient feeling well this morning.  Has no complaints.  Denies chest pain, shortness breath, abdominal pain, nausea or vomiting, diarrhea or constipation, dizziness or headache.  Objective:   Vitals:   12/07/17 1945 12/07/17 2007 12/07/17 2328 12/08/17 0535  BP: (!) 149/68 (!) 145/53 126/84 132/68  Pulse: (!) 59 63 60 72  Resp: 10  12   Temp: 97.7 F (36.5 C) (!) 97.5 F (36.4 C) 97.8 F (36.6 C) 97.9 F (36.6 C)  TempSrc:  Oral  Oral  SpO2: 92% 100% 95% 95%  Weight:    77.9 kg (171 lb 11.8 oz)    Intake/Output Summary (Last 24 hours) at 12/08/2017 1351 Last data filed at 12/08/2017 1015 Gross per 24 hour  Intake 1130 ml  Output 1310 ml  Net -180 ml   Filed Weights   12/08/17 0535  Weight: 77.9 kg (171 lb 11.8 oz)    Exam  General: Well developed, well nourished, NAD, appears stated age  HEENT: NCAT, mucous membranes moist.   Neck: Supple  Cardiovascular: S1 S2 auscultated, no rubs, murmurs or gallops. Regular rate and rhythm.  Respiratory: Clear to auscultation bilaterally with equal chest rise  Abdomen: Soft, nontender, nondistended, + bowel sounds  Extremities: warm dry without cyanosis clubbing or edema. Bandage on left thigh  Neuro: AAOx3, nonfocal  Psych: Normal affect and demeanor with intact judgement and insight, pleasant   Data Reviewed: I have personally reviewed following labs and imaging studies  CBC: Recent Labs  Lab 12/07/17 1007 12/08/17 0514  WBC 8.8 9.9  NEUTROABS 7.1  --   HGB 13.0 11.0*  HCT 39.9 33.8*  MCV 93.7 94.9  PLT 269 161   Basic  Metabolic Panel: Recent Labs  Lab 12/07/17 1007 12/08/17 0514  NA 140 136  K 3.9 3.4*  CL 106 104  CO2 22 24  GLUCOSE 144* 128*  BUN 15 13  CREATININE 1.02* 0.97  CALCIUM 9.1 8.0*   GFR: Estimated Creatinine Clearance: 30.6 mL/min (by C-G formula based on SCr of 0.97 mg/dL). Liver Function Tests: Recent Labs  Lab 12/07/17 1007  AST 25  ALT 18  ALKPHOS 78  BILITOT 1.8*  PROT 7.3  ALBUMIN 3.5   No results for input(s): LIPASE, AMYLASE in the last 168 hours. No results for input(s): AMMONIA in the last 168 hours. Coagulation Profile: Recent Labs  Lab 12/07/17 1848  INR 1.13   Cardiac Enzymes: No results for input(s): CKTOTAL, CKMB, CKMBINDEX, TROPONINI in the last 168 hours. BNP (last 3 results) No results for input(s): PROBNP in the last 8760 hours. HbA1C: Recent Labs    12/07/17 1007  HGBA1C 7.1*   CBG: Recent Labs  Lab 12/07/17 1535 12/07/17 1825 12/07/17 2212 12/08/17 0619 12/08/17 1141  GLUCAP 132* 147* 151* 127* 212*   Lipid Profile: No results for input(s): CHOL, HDL, LDLCALC, TRIG, CHOLHDL, LDLDIRECT in  the last 72 hours. Thyroid Function Tests: No results for input(s): TSH, T4TOTAL, FREET4, T3FREE, THYROIDAB in the last 72 hours. Anemia Panel: No results for input(s): VITAMINB12, FOLATE, FERRITIN, TIBC, IRON, RETICCTPCT in the last 72 hours. Urine analysis:    Component Value Date/Time   COLORURINE YELLOW 12/07/2017 [place] 12/07/2017 1041   LABSPEC 1.015 12/07/2017 1041   PHURINE 9.0 (H) 12/07/2017 1041   GLUCOSEU NEGATIVE 12/07/2017 1041   HGBUR NEGATIVE 12/07/2017 1041   [place] 12/07/2017 1041   KETONESUR 20 (A) 12/07/2017 1041   PROTEINUR NEGATIVE 12/07/2017 1041   UROBILINOGEN 0.2 03/07/2009 1020   NITRITE NEGATIVE 12/07/2017 1041   LEUKOCYTESUR TRACE (A) 12/07/2017 1041   Sepsis Labs: @LABRCNTIP (procalcitonin:4,lacticidven:4)  ) Recent Results (from the past 240 hour(s))  Urine culture      Status: Abnormal (Preliminary result)   Collection Time: 12/07/17 12:30 PM  Result Value Ref Range Status   Specimen Description URINE, CLEAN CATCH  Final   Special Requests NONE  Final   Culture (A)  Final    >=100,000 COLONIES/mL KLEBSIELLA PNEUMONIAE >=100,000 COLONIES/mL ESCHERICHIA COLI SUSCEPTIBILITIES TO FOLLOW Performed at [facility], [place] [address] [place], [place] [address]    Report Status PENDING  Incomplete      Radiology Studies: Dg Chest 1 View  Result Date: 12/07/2017 CLINICAL DATA:  Patient status post fall today. EXAM: CHEST  1 VIEW COMPARISON:  PA and lateral chest 05/14/2015 and 04/25/2009. FINDINGS: Two lead pacing device in place. There is cardiomegaly without edema. Aortic atherosclerosis is noted. No pneumothorax or pleural fluid. No acute or focal bony abnormality. IMPRESSION: No acute disease. Cardiomegaly. Atherosclerosis. Electronically Signed   By: Inge Rise M.D.   On: 12/07/2017 11:43   Dg Pelvis 1-2 Views  Result Date: 12/07/2017 CLINICAL DATA:  Pain due to a fall today.  Initial encounter. EXAM: PELVIS - 1-2 VIEW COMPARISON:  None. FINDINGS: The patient has a nondisplaced fracture through approximately the mid aspect of the left femoral neck. No other acute bony or joint abnormality is identified. Mesh from hernia repair and atherosclerotic vascular disease are noted. IMPRESSION: Acute nondisplaced fracture mid aspect of the left femoral neck. Electronically Signed   By: Inge Rise M.D.   On: 12/07/2017 11:44   Ct Head Wo Contrast  Result Date: 12/07/2017 CLINICAL DATA:  84 year old female with unwitnessed fall this morning. Unsure if loss of consciousness. Denies head and neck pain. Initial encounter. EXAM: CT HEAD WITHOUT CONTRAST CT CERVICAL SPINE WITHOUT CONTRAST TECHNIQUE: Multidetector CT imaging of the head and cervical spine was performed following the standard protocol without intravenous contrast. Multiplanar CT image  reconstructions of the cervical spine were also generated. COMPARISON:  None. 04/18/2009 head CT. FINDINGS: CT brain findings. Brain: No intracranial hemorrhage or CT evidence of large acute infarct. Chronic microvascular changes. Moderate global atrophy. No intracranial mass lesion noted on this unenhanced exam. Vascular: Vascular calcifications Skull: No skull fracture. Remote right convexity craniotomy. Sinuses/Orbits: No acute orbital abnormality. Complete opacification left sphenoid sinus. Mucosal thickening right sphenoid sinus. Extensive thickening of bony landmarks of the sinuses and extensive prior sinus surgery. Moderate mucosal thickening surrounding ethmoid sinus region and maxillary sinuses. Other: Mastoid air cells and middle ear cavities are clear. CT CERVICAL SPINE FINDINGS Alignment: Mild curvature convex left with slight head tilt. Skull base and vertebrae: No cervical spine fracture. Soft tissues and spinal canal: No abnormal prevertebral soft tissue swelling. Disc levels: Cervical spondylotic changes without high-grade spinal  stenosis. This includes degenerative changes C1-2 articulation greater on the left and facet degenerative changes bilaterally C3-4 and C4-5 level. Partial fusion C3-4 and dens with basion. Upper chest: No worrisome abnormality. Other: 2 x 2.5 cm left lobe with thyroid gland nodule. This can be followed by thyroid ultrasound if clinically desired. IMPRESSION: 1. No skull fracture or intracranial hemorrhage. 2. No cervical spine fracture or abnormal prevertebral soft tissue swelling. Degenerative changes throughout cervical spine. Mild curvature convex left with slight head tilt. 3. Extensive prior sinus surgery with changes of chronic sinusitis. 4. 2 x 2.5 cm left lobe thyroid nodule. This can be followed by thyroid ultrasound if clinically desired. 5. Chronic microvascular changes.  Atrophy. Electronically Signed   By: Genia Del M.D.   On: 12/07/2017 11:40   Ct  Cervical Spine Wo Contrast  Result Date: 12/07/2017 CLINICAL DATA:  84 year old female with unwitnessed fall this morning. Unsure if loss of consciousness. Denies head and neck pain. Initial encounter. EXAM: CT HEAD WITHOUT CONTRAST CT CERVICAL SPINE WITHOUT CONTRAST TECHNIQUE: Multidetector CT imaging of the head and cervical spine was performed following the standard protocol without intravenous contrast. Multiplanar CT image reconstructions of the cervical spine were also generated. COMPARISON:  None. 04/18/2009 head CT. FINDINGS: CT brain findings. Brain: No intracranial hemorrhage or CT evidence of large acute infarct. Chronic microvascular changes. Moderate global atrophy. No intracranial mass lesion noted on this unenhanced exam. Vascular: Vascular calcifications Skull: No skull fracture. Remote right convexity craniotomy. Sinuses/Orbits: No acute orbital abnormality. Complete opacification left sphenoid sinus. Mucosal thickening right sphenoid sinus. Extensive thickening of bony landmarks of the sinuses and extensive prior sinus surgery. Moderate mucosal thickening surrounding ethmoid sinus region and maxillary sinuses. Other: Mastoid air cells and middle ear cavities are clear. CT CERVICAL SPINE FINDINGS Alignment: Mild curvature convex left with slight head tilt. Skull base and vertebrae: No cervical spine fracture. Soft tissues and spinal canal: No abnormal prevertebral soft tissue swelling. Disc levels: Cervical spondylotic changes without high-grade spinal stenosis. This includes degenerative changes C1-2 articulation greater on the left and facet degenerative changes bilaterally C3-4 and C4-5 level. Partial fusion C3-4 and dens with basion. Upper chest: No worrisome abnormality. Other: 2 x 2.5 cm left lobe with thyroid gland nodule. This can be followed by thyroid ultrasound if clinically desired. IMPRESSION: 1. No skull fracture or intracranial hemorrhage. 2. No cervical spine fracture or abnormal  prevertebral soft tissue swelling. Degenerative changes throughout cervical spine. Mild curvature convex left with slight head tilt. 3. Extensive prior sinus surgery with changes of chronic sinusitis. 4. 2 x 2.5 cm left lobe thyroid nodule. This can be followed by thyroid ultrasound if clinically desired. 5. Chronic microvascular changes.  Atrophy. Electronically Signed   By: Genia Del M.D.   On: 12/07/2017 11:40   Pelvis Portable  Result Date: 12/07/2017 CLINICAL DATA:  LEFT hip fracture. EXAM: OPERATIVE LEFT HIP (WITH PELVIS IF PERFORMED) 2 VIEWS TECHNIQUE: Fluoroscopic spot image(s) were submitted for interpretation post-operatively. COMPARISON:  Preoperative pelvic radiograph earlier today FINDINGS: Three cannulated screws have been placed across a nondisplaced fracture of the mid aspect of the LEFT femoral neck. IMPRESSION: Improved position and alignment.  No adverse features. Electronically Signed   By: Staci Righter M.D.   On: 12/07/2017 19:40   Dg Knee Complete 4 Views Left  Result Date: 12/07/2017 CLINICAL DATA:  Left knee pain.  Closed left hip fracture EXAM: LEFT KNEE - COMPLETE 4+ VIEW COMPARISON:  06/13/2016 FINDINGS: No evidence of fracture, dislocation,  or joint effusion. Tricompartmental osteoarthritis with marginal spurring and narrowing that is generalized. Patellofemoral degeneration is the greatest. Osteopenia and atherosclerosis. IMPRESSION: 1. No acute finding. 2. Osteoarthritis and atherosclerosis. Electronically Signed   By: Monte Fantasia M.D.   On: 12/07/2017 14:49   Dg C-arm 1-60 Min  Result Date: 12/07/2017 CLINICAL DATA:  LEFT hip fracture. EXAM: OPERATIVE LEFT HIP (WITH PELVIS IF PERFORMED) 2 VIEWS TECHNIQUE: Fluoroscopic spot image(s) were submitted for interpretation post-operatively. COMPARISON:  Preoperative pelvic radiograph earlier today FINDINGS: Three cannulated screws have been placed across a nondisplaced fracture of the mid aspect of the LEFT femoral neck.  IMPRESSION: Improved position and alignment.  No adverse features. Electronically Signed   By: Staci Righter M.D.   On: 12/07/2017 19:40   Dg Hip Operative Unilat W Or W/o Pelvis Left  Result Date: 12/07/2017 CLINICAL DATA:  LEFT hip fracture. EXAM: OPERATIVE LEFT HIP (WITH PELVIS IF PERFORMED) 2 VIEWS TECHNIQUE: Fluoroscopic spot image(s) were submitted for interpretation post-operatively. COMPARISON:  Preoperative pelvic radiograph earlier today FINDINGS: Three cannulated screws have been placed across a nondisplaced fracture of the mid aspect of the LEFT femoral neck. IMPRESSION: Improved position and alignment.  No adverse features. Electronically Signed   By: Staci Righter M.D.   On: 12/07/2017 19:40     Scheduled Meds: . acetaminophen  500 mg Oral Q6H  . apixaban  5 mg Oral BID  . docusate sodium  100 mg Oral BID  . ferrous sulfate  325 mg Oral TID PC  . furosemide  40 mg Oral Daily  . insulin aspart  0-5 Units Subcutaneous QHS  . insulin aspart  0-9 Units Subcutaneous TID WC  . levothyroxine  88 mcg Oral QAC breakfast  . losartan  100 mg Oral Daily  . simvastatin  40 mg Oral QHS   Continuous Infusions: . sodium chloride 50 mL/hr at 12/07/17 2247     LOS: 1 day   Time Spent in minutes   30 minutes  Aryanne Gilleland D.O. on 12/08/2017 at 1:51 PM  Between 7am to 7pm - Pager - [phone]  After 7pm go to [email or web address] - password [email or web address]  And look for the night coverage person covering for me after hours  [facility] Office  [phone]

## 2017-12-08 NOTE — Progress Notes (Signed)
Pt. transported from PACU via bed to 5N-24; alert and oriented x3 forgetful re: place /hospital; she thought she was at home, oriented to call button and room; family with pt.

## 2017-12-08 NOTE — Addendum Note (Signed)
Addendum  created 12/08/17 0907 by Imagene Riches, CRNA   Intraprocedure Event edited

## 2017-12-08 NOTE — Progress Notes (Signed)
Nutrition Consult/Brief Note  RD consulted via Hip Fracture Protocol.  She is s/p cannulated pinning of her L hip.  Wt Readings from Last 15 Encounters:  12/08/17 171 lb 11.8 oz (77.9 kg)  06/24/17 160 lb (72.6 kg)  12/25/16 161 lb 12.8 oz (73.4 kg)  09/10/16 158 lb 4 oz (71.8 kg)  07/04/16 155 lb (70.3 kg)  11/23/15 155 lb (70.3 kg)  10/10/15 155 lb (70.3 kg)  05/24/15 145 lb 8 oz (66 kg)  05/17/15 145 lb 4.8 oz (65.9 kg)  09/25/14 158 lb 12.8 oz (72 kg)  08/04/14 155 lb 7 oz (70.5 kg)  02/09/14 160 lb (72.6 kg)  08/31/13 156 lb (70.8 kg)  08/31/13 156 lb 1.9 oz (70.8 kg)  02/25/13 151 lb (68.5 kg)   Body mass index is 32.45 kg/m. Patient meets criteria for Obesity Class I based on current BMI.   Current diet order is Regular, patient is consuming approximately 75% of meals at this time. Labs and medications reviewed.   No nutrition interventions warranted at this time. If nutrition issues arise, please consult RD.   Arthur Holms, RD, LDN Pager #: [phone] After-Hours Pager #: [phone]

## 2017-12-09 DIAGNOSIS — I2510 Atherosclerotic heart disease of native coronary artery without angina pectoris: Secondary | ICD-10-CM

## 2017-12-09 DIAGNOSIS — Z950 Presence of cardiac pacemaker: Secondary | ICD-10-CM | POA: Diagnosis not present

## 2017-12-09 DIAGNOSIS — E039 Hypothyroidism, unspecified: Secondary | ICD-10-CM | POA: Diagnosis not present

## 2017-12-09 DIAGNOSIS — Y838 Other surgical procedures as the cause of abnormal reaction of the patient, or of later complication, without mention of misadventure at the time of the procedure: Secondary | ICD-10-CM | POA: Diagnosis present

## 2017-12-09 DIAGNOSIS — R278 Other lack of coordination: Secondary | ICD-10-CM | POA: Diagnosis not present

## 2017-12-09 DIAGNOSIS — R339 Retention of urine, unspecified: Secondary | ICD-10-CM | POA: Diagnosis not present

## 2017-12-09 DIAGNOSIS — J90 Pleural effusion, not elsewhere classified: Secondary | ICD-10-CM | POA: Diagnosis not present

## 2017-12-09 DIAGNOSIS — R2681 Unsteadiness on feet: Secondary | ICD-10-CM | POA: Diagnosis not present

## 2017-12-09 DIAGNOSIS — Z4801 Encounter for change or removal of surgical wound dressing: Secondary | ICD-10-CM | POA: Diagnosis not present

## 2017-12-09 DIAGNOSIS — M25552 Pain in left hip: Secondary | ICD-10-CM | POA: Diagnosis not present

## 2017-12-09 DIAGNOSIS — S72002D Fracture of unspecified part of neck of left femur, subsequent encounter for closed fracture with routine healing: Secondary | ICD-10-CM | POA: Diagnosis not present

## 2017-12-09 DIAGNOSIS — Z471 Aftercare following joint replacement surgery: Secondary | ICD-10-CM | POA: Diagnosis not present

## 2017-12-09 DIAGNOSIS — I441 Atrioventricular block, second degree: Secondary | ICD-10-CM | POA: Diagnosis not present

## 2017-12-09 DIAGNOSIS — R4182 Altered mental status, unspecified: Secondary | ICD-10-CM | POA: Diagnosis not present

## 2017-12-09 DIAGNOSIS — S7292XA Unspecified fracture of left femur, initial encounter for closed fracture: Secondary | ICD-10-CM | POA: Diagnosis not present

## 2017-12-09 DIAGNOSIS — K5900 Constipation, unspecified: Secondary | ICD-10-CM | POA: Diagnosis not present

## 2017-12-09 DIAGNOSIS — Z79899 Other long term (current) drug therapy: Secondary | ICD-10-CM | POA: Diagnosis not present

## 2017-12-09 DIAGNOSIS — S72002A Fracture of unspecified part of neck of left femur, initial encounter for closed fracture: Secondary | ICD-10-CM | POA: Diagnosis not present

## 2017-12-09 DIAGNOSIS — I5032 Chronic diastolic (congestive) heart failure: Secondary | ICD-10-CM | POA: Diagnosis not present

## 2017-12-09 DIAGNOSIS — F419 Anxiety disorder, unspecified: Secondary | ICD-10-CM | POA: Diagnosis not present

## 2017-12-09 DIAGNOSIS — I110 Hypertensive heart disease with heart failure: Secondary | ICD-10-CM | POA: Diagnosis not present

## 2017-12-09 DIAGNOSIS — W19XXXD Unspecified fall, subsequent encounter: Secondary | ICD-10-CM | POA: Diagnosis present

## 2017-12-09 DIAGNOSIS — I482 Chronic atrial fibrillation: Secondary | ICD-10-CM | POA: Diagnosis not present

## 2017-12-09 DIAGNOSIS — N390 Urinary tract infection, site not specified: Secondary | ICD-10-CM | POA: Diagnosis not present

## 2017-12-09 DIAGNOSIS — S72002G Fracture of unspecified part of neck of left femur, subsequent encounter for closed fracture with delayed healing: Secondary | ICD-10-CM | POA: Diagnosis not present

## 2017-12-09 DIAGNOSIS — I10 Essential (primary) hypertension: Secondary | ICD-10-CM | POA: Diagnosis not present

## 2017-12-09 DIAGNOSIS — Z9861 Coronary angioplasty status: Secondary | ICD-10-CM | POA: Diagnosis not present

## 2017-12-09 DIAGNOSIS — T84115A Breakdown (mechanical) of internal fixation device of left femur, initial encounter: Secondary | ICD-10-CM | POA: Diagnosis not present

## 2017-12-09 DIAGNOSIS — E119 Type 2 diabetes mellitus without complications: Secondary | ICD-10-CM | POA: Diagnosis not present

## 2017-12-09 DIAGNOSIS — E785 Hyperlipidemia, unspecified: Secondary | ICD-10-CM | POA: Diagnosis not present

## 2017-12-09 DIAGNOSIS — Z7901 Long term (current) use of anticoagulants: Secondary | ICD-10-CM | POA: Diagnosis not present

## 2017-12-09 DIAGNOSIS — Z888 Allergy status to other drugs, medicaments and biological substances status: Secondary | ICD-10-CM | POA: Diagnosis not present

## 2017-12-09 DIAGNOSIS — E222 Syndrome of inappropriate secretion of antidiuretic hormone: Secondary | ICD-10-CM | POA: Diagnosis not present

## 2017-12-09 DIAGNOSIS — I739 Peripheral vascular disease, unspecified: Secondary | ICD-10-CM | POA: Diagnosis not present

## 2017-12-09 DIAGNOSIS — Z85038 Personal history of other malignant neoplasm of large intestine: Secondary | ICD-10-CM | POA: Diagnosis not present

## 2017-12-09 DIAGNOSIS — R609 Edema, unspecified: Secondary | ICD-10-CM | POA: Diagnosis not present

## 2017-12-09 DIAGNOSIS — R279 Unspecified lack of coordination: Secondary | ICD-10-CM | POA: Diagnosis not present

## 2017-12-09 DIAGNOSIS — E1165 Type 2 diabetes mellitus with hyperglycemia: Secondary | ICD-10-CM | POA: Diagnosis not present

## 2017-12-09 DIAGNOSIS — M6281 Muscle weakness (generalized): Secondary | ICD-10-CM | POA: Diagnosis not present

## 2017-12-09 DIAGNOSIS — R600 Localized edema: Secondary | ICD-10-CM | POA: Diagnosis not present

## 2017-12-09 DIAGNOSIS — M79605 Pain in left leg: Secondary | ICD-10-CM | POA: Diagnosis not present

## 2017-12-09 DIAGNOSIS — Z96642 Presence of left artificial hip joint: Secondary | ICD-10-CM | POA: Diagnosis not present

## 2017-12-09 DIAGNOSIS — Z743 Need for continuous supervision: Secondary | ICD-10-CM | POA: Diagnosis not present

## 2017-12-09 DIAGNOSIS — Z66 Do not resuscitate: Secondary | ICD-10-CM | POA: Diagnosis not present

## 2017-12-09 DIAGNOSIS — F329 Major depressive disorder, single episode, unspecified: Secondary | ICD-10-CM | POA: Diagnosis not present

## 2017-12-09 DIAGNOSIS — D62 Acute posthemorrhagic anemia: Secondary | ICD-10-CM | POA: Diagnosis not present

## 2017-12-09 DIAGNOSIS — R41841 Cognitive communication deficit: Secondary | ICD-10-CM | POA: Diagnosis not present

## 2017-12-09 LAB — BASIC METABOLIC PANEL
ANION GAP: 8 (ref 5–15)
BUN: 13 mg/dL (ref 8–23)
CHLORIDE: 104 mmol/L (ref 98–111)
CO2: 26 mmol/L (ref 22–32)
Calcium: 8.2 mg/dL — ABNORMAL LOW (ref 8.9–10.3)
Creatinine, Ser: 1.12 mg/dL — ABNORMAL HIGH (ref 0.44–1.00)
GFR calc Af Amer: 46 mL/min — ABNORMAL LOW (ref >60)
GFR, EST NON AFRICAN AMERICAN: 40 mL/min — AB (ref >60)
Glucose, Bld: 193 mg/dL — ABNORMAL HIGH (ref 70–99)
POTASSIUM: 3.3 mmol/L — AB (ref 3.5–5.1)
SODIUM: 138 mmol/L (ref 135–145)

## 2017-12-09 LAB — CBC
HCT: 33.7 % — ABNORMAL LOW (ref 36.0–46.0)
HEMOGLOBIN: 11.4 g/dL — AB (ref 12.0–15.0)
MCH: 31.7 pg (ref 26.0–34.0)
MCHC: 33.8 g/dL (ref 30.0–36.0)
MCV: 93.6 fL (ref 78.0–100.0)
PLATELETS: 210 10*3/uL (ref 150–400)
RBC: 3.6 MIL/uL — AB (ref 3.87–5.11)
RDW: 13 % (ref 11.5–15.5)
WBC: 10 10*3/uL (ref 4.0–10.5)

## 2017-12-09 LAB — URINE CULTURE

## 2017-12-09 LAB — GLUCOSE, CAPILLARY
GLUCOSE-CAPILLARY: 181 mg/dL — AB (ref 70–99)
GLUCOSE-CAPILLARY: 225 mg/dL — AB (ref 70–99)

## 2017-12-09 MED ORDER — POLYETHYLENE GLYCOL 3350 17 G PO PACK
17.0000 g | PACK | Freq: Every day | ORAL | Status: DC
  Administered 2017-12-09: 17 g via ORAL
  Filled 2017-12-09: qty 1

## 2017-12-09 MED ORDER — POTASSIUM CHLORIDE CRYS ER 20 MEQ PO TBCR: 20.0000 meq | EXTENDED_RELEASE_TABLET | Freq: Every day | ORAL | 0 refills | Status: AC

## 2017-12-09 MED ORDER — POLYETHYLENE GLYCOL 3350 17 G PO PACK: 17.0000 g | PACK | Freq: Every day | ORAL | 0 refills | Status: AC

## 2017-12-09 MED ORDER — HYDROCODONE-ACETAMINOPHEN 5-325 MG PO TABS: 1.0000 | ORAL_TABLET | Freq: Four times a day (QID) | ORAL | 0 refills | Status: DC | PRN

## 2017-12-09 MED ORDER — FERROUS SULFATE 325 (65 FE) MG PO TABS: 325.0000 mg | ORAL_TABLET | Freq: Two times a day (BID) | ORAL | 3 refills | Status: DC

## 2017-12-09 MED ORDER — POTASSIUM CHLORIDE CRYS ER 20 MEQ PO TBCR
40.0000 meq | EXTENDED_RELEASE_TABLET | Freq: Once | ORAL | Status: AC
  Administered 2017-12-09: 40 meq via ORAL
  Filled 2017-12-09: qty 2

## 2017-12-09 NOTE — Progress Notes (Signed)
OT Cancellation Note  Patient Details Name: Archita Lomeli MRN: [id number] DOB: August 14, 1916   Cancelled Treatment:    Reason Eval/Treat Not Completed: Other (comment)  Pt plans to go to SNF- will defer OT eval to SNF [facility] [place] [place]  Betsy Pries 12/09/2017, 11:56 AM

## 2017-12-09 NOTE — Clinical Social Work Note (Signed)
Clinical Social Work Assessment  Patient Details  Name: [name] MRN: [id number] Date of Birth: [birth year]  Date of referral:  12/09/17               Reason for consult:  Facility Placement                Permission sought to share information with:  Facility Art therapist granted to share information::  Yes, Verbal Permission Granted  Name::     Jiles Garter  Agency::  SNFs   Relationship::  Niece  Contact Information:  [phone]  Housing/Transportation Living arrangements for the past 2 months:  [place] Information:  Patient, Other (Comment Required)(patient's niece) Patient Interpreter Needed:  None Criminal Activity/Legal Involvement Pertinent to Current Situation/Hospitalization:  No - Comment as needed Significant Relationships:  Other Family Members Lives with:  Self Do you feel safe going back to the place where you live?  No Need for family participation in patient care:  Yes (Comment)  Care giving concerns:  CSW received consult for discharge needs. CSW spoke with patient and her niece, [name] at bedside regarding PT recommendation of SNF placement at time of discharge. Patient's niece reports patient lives alone.    Social Worker assessment / plan: CSW spoke with patient and her niece concerning possibility of rehab at [facility] before returning home.  Employment status:  Retired Forensic scientist:  Medicare PT Recommendations:  [place] / Referral to community resources:  [place]  Patient/Family's Response to care: Patient recognizes need for rehab before returning home and is agreeable to a SNF in [place]. Patient reported preference for [facility] in [place].  Patient/Family's Understanding of and Emotional Response to Diagnosis, Current Treatment, and Prognosis:  Patient/family is realistic regarding therapy needs and expressed being hopeful for SNF placement.  Patient expressed understanding of CSW role and discharge process as well as medical condition. No questions/concerns about plan or treatment.   Emotional Assessment Appearance:  Appears stated age Attitude/Demeanor/Rapport:  Engaged Affect (typically observed):  Pleasant, Accepting, Calm, Happy Orientation:  Oriented to Self, Oriented to Place, Oriented to  Time, Oriented to Situation Alcohol / Substance use:  Not Applicable Psych involvement (Current and /or in the community):  No (Comment)  Discharge Needs  Concerns to be addressed:  Basic Needs, Care Coordination Readmission within the last 30 days:  No Current discharge risk:  Lives alone Barriers to Discharge:  Continued Medical Work up   [employer], [place] 12/09/2017, 10:35 AM

## 2017-12-09 NOTE — Progress Notes (Signed)
     Subjective:  Patient reports pain as mild.  No complaints, eating breakfast.  Objective:   VITALS:   Vitals:   12/08/17 1514 12/08/17 1649 12/08/17 1956 12/09/17 0341  BP: (!) 105/50 (!) 105/50 (!) 111/53 (!) 146/77  Pulse: 60 60 70 66  Resp: 16 16 16 16   Temp:  97.9 F (36.6 C) 99 F (37.2 C) 98 F (36.7 C)  TempSrc:  Oral Oral Oral  SpO2: 95%  97% 96%  Weight:  77.6 kg (171 lb)    Height:  5\' 3"  (1.6 m)      Neurologically intact Dorsiflexion/Plantar flexion intact Incision: no drainage   Lab Results  Component Value Date   WBC 10.0 12/09/2017   HGB 11.4 (L) 12/09/2017   HCT 33.7 (L) 12/09/2017   MCV 93.6 12/09/2017   PLT 210 12/09/2017   BMET    Component Value Date/Time   NA 138 12/09/2017 0459   K 3.3 (L) 12/09/2017 0459   CL 104 12/09/2017 0459   CO2 26 12/09/2017 0459   GLUCOSE 193 (H) 12/09/2017 0459   BUN 13 12/09/2017 0459   CREATININE 1.12 (H) 12/09/2017 0459   CREATININE 1.01 (H) 10/10/2015 1424   CALCIUM 8.2 (L) 12/09/2017 0459   GFRNONAA 40 (L) 12/09/2017 0459   GFRAA 46 (L) 12/09/2017 0459     Assessment/Plan: 2 Days Post-Op   Principal Problem:   Fracture of femoral neck, left (HCC) Active Problems:   Essential hypertension   CAD S/P percutaneous coronary angioplasty   Permanent atrial fibrillation (HCC)   Chronic diastolic CHF (congestive heart failure) (HCC)   Type II diabetes mellitus, well controlled (HCC)   Hypothyroid   Chronic anticoagulation   UTI (urinary tract infection)   Advance diet Up with therapy Discharge to SNF   [name] 12/09/2017, 7:58 AM   [name] Bond, MD Cell [phone]

## 2017-12-09 NOTE — Clinical Social Work Placement (Signed)
   CLINICAL SOCIAL WORK PLACEMENT  NOTE  Date:  12/09/2017  Patient Details  Name: [name] MRN: [id number] Date of Birth: 11-21-1916  Clinical Social Work is seeking post-discharge placement for this patient at the [place] level of care (*CSW will initial, date and re-position this form in  chart as items are completed):  Yes   Patient/family provided with [facility] Work Department's list of facilities offering this level of care within the geographic area requested by the patient (or if unable, by the patient's family).  Yes   Patient/family informed of their freedom to choose among providers that offer the needed level of care, that participate in Medicare, Medicaid or managed care program needed by the patient, have an available bed and are willing to accept the patient.  Yes   Patient/family informed of [facility]'s ownership interest in [facility] and [facility], as well as of the fact that they are under no obligation to receive care at these facilities.  PASRR submitted to EDS on       PASRR number received on       Existing PASRR number confirmed on 12/09/17     FL2 transmitted to all facilities in geographic area requested by pt/family on 12/09/17     FL2 transmitted to all facilities within larger geographic area on 12/09/17     Patient informed that his/her managed care company has contracts with or will negotiate with certain facilities, including the following:        Yes   Patient/family informed of bed offers received.  Patient chooses bed at [place], [place]     Physician recommends and patient chooses bed at      Patient to be transferred to [place] on 12/09/17.  Patient to be transferred to facility by [facility]     Patient family notified on 12/09/17 of transfer.  Name of family member notified:  [name] patient's niece     PHYSICIAN       Additional Comment:     _______________________________________________ Vinie Sill, [place] 12/09/2017, 2:27 PM

## 2017-12-09 NOTE — Discharge Summary (Signed)
PATIENT DETAILS Name: [name] Age: 84 y.o. Sex: female Date of Birth: 1916-09-02 MRN: [id number]. Admitting Physician: Karmen Bongo, MD [id number], Ronie Spies, MD  Admit Date: 12/07/2017 Discharge date: 12/09/2017  Recommendations for Outpatient Follow-up:  1. Follow up with PCP in 1-2 weeks 2. Please obtain BMP/CBC in one week 3. Please ensure follow-up with orthopedics in 2 weeks  Admitted From:  Home  Disposition: SNF   Home Health: No  Equipment/Devices: None  Discharge Condition: Stable  CODE STATUS: DNR  Diet recommendation:  Heart Healthy / Carb Modified  Brief Summary: See H&P, Labs, Consult and Test reports for all details in brief, patient is a 84 year old female with prior history of diabetes, hypertension, A. fib on chronic anticoagulation-presented to the ED following a mechanical fall, she was found to have a left hip fracture and admitted to the hospitalist service.  Brief Hospital Course: Left hip fracture: Secondary to mechanical fall, consulted-underwent percutaneous skeletal fixation, postoperative course was uncomplicated.  Back on Eliquis-follow-up with orthopedics in 2 weeks.  Weightbearing as tolerated per orthopedics.   Asymptomatic bacteriuria: Although she has a urine culture positive-she really does not have any clinical features suggestive of UTI.  She did receive Rocephin and Keflex during this hospital stay-she will not be discharged with any further antimicrobial therapy.  Atrial fibrillation: Pacemaker in place-continue Eliquis.  CAD: Underwent PCI in 2010-without any anginal symptoms-tolerated orthopedic surgery well.  Chronic diastolic heart failure: Euvolemic-resume Lasix.  Continue to follow volume status closely while at SNF.  Essential hypertension: Controlled-continue Cozaar  Diet-controlled DM-2: A1c 7.1-continue outpatient monitoring.  Hypothyroidism: Continue Synthroid  Left thyroid nodule: a 2 x 2.5 cm  left lobe thyroid nodule was incidentally found on CT scan of the C-spine-stable for outpatient follow-up and work-up.  The patient was apprised of this finding by this MD.  Procedures/Studies: 7/29>>: Percutaneous skeletal fixation left femoral neck fracture  Discharge Diagnoses:  Principal Problem:   Fracture of femoral neck, left (HCC) Active Problems:   Essential hypertension   CAD S/P percutaneous coronary angioplasty   Permanent atrial fibrillation (HCC)   Chronic diastolic CHF (congestive heart failure) (HCC)   Type II diabetes mellitus, well controlled ([place])   Hypothyroid   Chronic anticoagulation   UTI (urinary tract infection)   Discharge Instructions:  Activity:  WBAT   Discharge Instructions    Diet - low sodium heart healthy   Complete by:  As directed    Discharge instructions   Complete by:  As directed    Follow with Primary MD  Leeroy Cha, MD  and  Dr Mardelle Matte in 2 weeks  You have a left thyroid nodule that was seen incidentally on a CT scan of your cervical spine-please ask your primary care practitioner to do further work-up in the outpatient setting (when you see her next)  Please get a complete blood count and chemistry panel checked by your Primary MD at your next visit, and again as instructed by your Primary MD.  Get Medicines reviewed and adjusted: Please take all your medications with you for your next visit with your Primary MD  Laboratory/radiological data: Please request your Primary MD to go over all hospital tests and procedure/radiological results at the follow up, please ask your Primary MD to get all Hospital records sent to his/her office.  In some cases, they will be blood work, cultures and biopsy results pending at the time of your discharge. Please request that your primary care M.D. follows up on these results.  Also Note the following: If you experience worsening of your admission symptoms, develop shortness of breath,  life threatening emergency, suicidal or homicidal thoughts you must seek medical attention immediately by calling 911 or calling your MD immediately  if symptoms less severe.  You must read complete instructions/literature along with all the possible adverse reactions/side effects for all the Medicines you take and that have been prescribed to you. Take any new Medicines after you have completely understood and accpet all the possible adverse reactions/side effects.   Do not drive when taking Pain medications or sleeping medications (Benzodaizepines)  Do not take more than prescribed Pain, Sleep and Anxiety Medications. It is not advisable to combine anxiety,sleep and pain medications without talking with your primary care practitioner  Special Instructions: If you have smoked or chewed Tobacco  in the last 2 yrs please stop smoking, stop any regular Alcohol  and or any Recreational drug use.  Wear Seat belts while driving.  Please note: You were cared for by a hospitalist during your hospital stay. Once you are discharged, your primary care physician will handle any further medical issues. Please note that NO REFILLS for any discharge medications will be authorized once you are discharged, as it is imperative that you return to your primary care physician (or establish a relationship with a primary care physician if you do not have one) for your post hospital discharge needs so that they can reassess your need for medications and monitor your lab values.   Increase activity slowly   Complete by:  As directed    WBAT to LLE   Weight bearing as tolerated   Complete by:  As directed      Allergies as of 12/09/2017      Reactions   Diclofenac Sodium    UNKNOWN   Hytrin [terazosin Hcl]    UNKNOWN   Ibuprofen    UNKNOWN   Norvasc [amlodipine Besylate]    UNKNOWN   Plavix [clopidogrel Bisulfate]    UNKNOWN      Medication List    TAKE these medications   ELIQUIS 5 MG Tabs  tablet Generic drug:  apixaban TAKE 1 TABLET BY MOUTH TWICE DAILY   ferrous sulfate 325 (65 FE) MG tablet Take 1 tablet (325 mg total) by mouth 2 (two) times daily with a meal.   furosemide 40 MG tablet Commonly known as:  LASIX Take 1 tablet (40 mg total) by mouth daily.   HYDROcodone-acetaminophen 5-325 MG tablet Commonly known as:  NORCO/VICODIN Take 1-2 tablets by mouth every 6 (six) hours as needed for moderate pain (pain score 4-6).   levothyroxine 88 MCG tablet Commonly known as:  SYNTHROID, LEVOTHROID Take 88 mcg by mouth daily.   losartan 100 MG tablet Commonly known as:  COZAAR Take 1 tablet (100 mg total) by mouth daily.   nitroGLYCERIN 0.4 MG SL tablet Commonly known as:  NITROSTAT Place 0.4 mg under the tongue every 5 (five) minutes as needed for chest pain.   polyethylene glycol packet Commonly known as:  MIRALAX / GLYCOLAX Take 17 g by mouth daily.   potassium chloride SA 20 MEQ tablet Commonly known as:  K-DUR,KLOR-CON Take 1 tablet (20 mEq total) by mouth daily.   simvastatin 40 MG tablet Commonly known as:  ZOCOR Take 1 tablet (40 mg total) by mouth at bedtime.            Discharge Care Instructions  (From admission, onward)        Start  Ordered   12/07/17 0000  Weight bearing as tolerated     12/07/17 1750     Follow-up Information    [name], MD. Schedule an appointment as soon as possible for a visit in 2 weeks.   Specialty:  Orthopedic Surgery Contact information: [place] [address] [phone]        Leeroy Cha, MD. Schedule an appointment as soon as possible for a visit in 1 week(s).   Specialty:  Internal Medicine Contact information: [address] [place] [address] [phone]          Allergies  Allergen Reactions  . Diclofenac Sodium     UNKNOWN  . Hytrin [Terazosin Hcl]     UNKNOWN   . Ibuprofen     UNKNOWN   . Norvasc  [Amlodipine Besylate]     UNKNOWN   . Plavix [Clopidogrel Bisulfate]     UNKNOWN     Consultations:   orthopedic surgery  Other Procedures/Studies: Dg Chest 1 View  Result Date: 12/07/2017 CLINICAL DATA:  Patient status post fall today. EXAM: CHEST  1 VIEW COMPARISON:  PA and lateral chest 05/14/2015 and 04/25/2009. FINDINGS: Two lead pacing device in place. There is cardiomegaly without edema. Aortic atherosclerosis is noted. No pneumothorax or pleural fluid. No acute or focal bony abnormality. IMPRESSION: No acute disease. Cardiomegaly. Atherosclerosis. Electronically Signed   By: Inge Rise M.D.   On: 12/07/2017 11:43   Dg Pelvis 1-2 Views  Result Date: 12/07/2017 CLINICAL DATA:  Pain due to a fall today.  Initial encounter. EXAM: PELVIS - 1-2 VIEW COMPARISON:  None. FINDINGS: The patient has a nondisplaced fracture through approximately the mid aspect of the left femoral neck. No other acute bony or joint abnormality is identified. Mesh from hernia repair and atherosclerotic vascular disease are noted. IMPRESSION: Acute nondisplaced fracture mid aspect of the left femoral neck. Electronically Signed   By: Inge Rise M.D.   On: 12/07/2017 11:44   Ct Head Wo Contrast  Result Date: 12/07/2017 CLINICAL DATA:  84 year old female with unwitnessed fall this morning. Unsure if loss of consciousness. Denies head and neck pain. Initial encounter. EXAM: CT HEAD WITHOUT CONTRAST CT CERVICAL SPINE WITHOUT CONTRAST TECHNIQUE: Multidetector CT imaging of the head and cervical spine was performed following the standard protocol without intravenous contrast. Multiplanar CT image reconstructions of the cervical spine were also generated. COMPARISON:  None. 04/18/2009 head CT. FINDINGS: CT brain findings. Brain: No intracranial hemorrhage or CT evidence of large acute infarct. Chronic microvascular changes. Moderate global atrophy. No intracranial mass lesion noted on this unenhanced exam.  Vascular: Vascular calcifications Skull: No skull fracture. Remote right convexity craniotomy. Sinuses/Orbits: No acute orbital abnormality. Complete opacification left sphenoid sinus. Mucosal thickening right sphenoid sinus. Extensive thickening of bony landmarks of the sinuses and extensive prior sinus surgery. Moderate mucosal thickening surrounding ethmoid sinus region and maxillary sinuses. Other: Mastoid air cells and middle ear cavities are clear. CT CERVICAL SPINE FINDINGS Alignment: Mild curvature convex left with slight head tilt. Skull base and vertebrae: No cervical spine fracture. Soft tissues and spinal canal: No abnormal prevertebral soft tissue swelling. Disc levels: Cervical spondylotic changes without high-grade spinal stenosis. This includes degenerative changes C1-2 articulation greater on the left and facet degenerative changes bilaterally C3-4 and C4-5 level. Partial fusion C3-4 and dens with basion. Upper chest: No worrisome abnormality. Other: 2 x 2.5 cm left lobe with thyroid gland nodule. This can be followed by  thyroid ultrasound if clinically desired. IMPRESSION: 1. No skull fracture or intracranial hemorrhage. 2. No cervical spine fracture or abnormal prevertebral soft tissue swelling. Degenerative changes throughout cervical spine. Mild curvature convex left with slight head tilt. 3. Extensive prior sinus surgery with changes of chronic sinusitis. 4. 2 x 2.5 cm left lobe thyroid nodule. This can be followed by thyroid ultrasound if clinically desired. 5. Chronic microvascular changes.  Atrophy. Electronically Signed   By: Genia Del M.D.   On: 12/07/2017 11:40   Ct Cervical Spine Wo Contrast  Result Date: 12/07/2017 CLINICAL DATA:  84 year old female with unwitnessed fall this morning. Unsure if loss of consciousness. Denies head and neck pain. Initial encounter. EXAM: CT HEAD WITHOUT CONTRAST CT CERVICAL SPINE WITHOUT CONTRAST TECHNIQUE: Multidetector CT imaging of the head and  cervical spine was performed following the standard protocol without intravenous contrast. Multiplanar CT image reconstructions of the cervical spine were also generated. COMPARISON:  None. 04/18/2009 head CT. FINDINGS: CT brain findings. Brain: No intracranial hemorrhage or CT evidence of large acute infarct. Chronic microvascular changes. Moderate global atrophy. No intracranial mass lesion noted on this unenhanced exam. Vascular: Vascular calcifications Skull: No skull fracture. Remote right convexity craniotomy. Sinuses/Orbits: No acute orbital abnormality. Complete opacification left sphenoid sinus. Mucosal thickening right sphenoid sinus. Extensive thickening of bony landmarks of the sinuses and extensive prior sinus surgery. Moderate mucosal thickening surrounding ethmoid sinus region and maxillary sinuses. Other: Mastoid air cells and middle ear cavities are clear. CT CERVICAL SPINE FINDINGS Alignment: Mild curvature convex left with slight head tilt. Skull base and vertebrae: No cervical spine fracture. Soft tissues and spinal canal: No abnormal prevertebral soft tissue swelling. Disc levels: Cervical spondylotic changes without high-grade spinal stenosis. This includes degenerative changes C1-2 articulation greater on the left and facet degenerative changes bilaterally C3-4 and C4-5 level. Partial fusion C3-4 and dens with basion. Upper chest: No worrisome abnormality. Other: 2 x 2.5 cm left lobe with thyroid gland nodule. This can be followed by thyroid ultrasound if clinically desired. IMPRESSION: 1. No skull fracture or intracranial hemorrhage. 2. No cervical spine fracture or abnormal prevertebral soft tissue swelling. Degenerative changes throughout cervical spine. Mild curvature convex left with slight head tilt. 3. Extensive prior sinus surgery with changes of chronic sinusitis. 4. 2 x 2.5 cm left lobe thyroid nodule. This can be followed by thyroid ultrasound if clinically desired. 5. Chronic  microvascular changes.  Atrophy. Electronically Signed   By: Genia Del M.D.   On: 12/07/2017 11:40   Pelvis Portable  Result Date: 12/07/2017 CLINICAL DATA:  LEFT hip fracture. EXAM: OPERATIVE LEFT HIP (WITH PELVIS IF PERFORMED) 2 VIEWS TECHNIQUE: Fluoroscopic spot image(s) were submitted for interpretation post-operatively. COMPARISON:  Preoperative pelvic radiograph earlier today FINDINGS: Three cannulated screws have been placed across a nondisplaced fracture of the mid aspect of the LEFT femoral neck. IMPRESSION: Improved position and alignment.  No adverse features. Electronically Signed   By: Staci Righter M.D.   On: 12/07/2017 19:40   Dg Knee Complete 4 Views Left  Result Date: 12/07/2017 CLINICAL DATA:  Left knee pain.  Closed left hip fracture EXAM: LEFT KNEE - COMPLETE 4+ VIEW COMPARISON:  06/13/2016 FINDINGS: No evidence of fracture, dislocation, or joint effusion. Tricompartmental osteoarthritis with marginal spurring and narrowing that is generalized. Patellofemoral degeneration is the greatest. Osteopenia and atherosclerosis. IMPRESSION: 1. No acute finding. 2. Osteoarthritis and atherosclerosis. Electronically Signed   By: Monte Fantasia M.D.   On: 12/07/2017 14:49   Dg C-arm  1-60 Min  Result Date: 12/07/2017 CLINICAL DATA:  LEFT hip fracture. EXAM: OPERATIVE LEFT HIP (WITH PELVIS IF PERFORMED) 2 VIEWS TECHNIQUE: Fluoroscopic spot image(s) were submitted for interpretation post-operatively. COMPARISON:  Preoperative pelvic radiograph earlier today FINDINGS: Three cannulated screws have been placed across a nondisplaced fracture of the mid aspect of the LEFT femoral neck. IMPRESSION: Improved position and alignment.  No adverse features. Electronically Signed   By: Staci Righter M.D.   On: 12/07/2017 19:40   Dg Hip Operative Unilat W Or W/o Pelvis Left  Result Date: 12/07/2017 CLINICAL DATA:  LEFT hip fracture. EXAM: OPERATIVE LEFT HIP (WITH PELVIS IF PERFORMED) 2 VIEWS TECHNIQUE:  Fluoroscopic spot image(s) were submitted for interpretation post-operatively. COMPARISON:  Preoperative pelvic radiograph earlier today FINDINGS: Three cannulated screws have been placed across a nondisplaced fracture of the mid aspect of the LEFT femoral neck. IMPRESSION: Improved position and alignment.  No adverse features. Electronically Signed   By: Staci Righter M.D.   On: 12/07/2017 19:40     TODAY-DAY OF DISCHARGE:  Subjective:   Vielka Klinedinst today has no headache,no chest abdominal pain,no new weakness tingling or numbness, feels much better wants to go home today.   Objective:   Blood pressure (!) 146/77, pulse 66, temperature 98 F (36.7 C), temperature source Oral, resp. rate 16, height 5\' 3"  (1.6 m), weight 77.6 kg (171 lb), SpO2 96 %.  Intake/Output Summary (Last 24 hours) at 12/09/2017 1213 Last data filed at 12/09/2017 0811 Gross per 24 hour  Intake 1290.83 ml  Output -  Net 1290.83 ml   Filed Weights   12/08/17 0535 12/08/17 1649  Weight: 77.9 kg (171 lb 11.8 oz) 77.6 kg (171 lb)    Exam: Awake Alert, Oriented *3, No new F.N deficits, Normal affect [place].AT,PERRAL Supple Neck,No JVD, No cervical lymphadenopathy appriciated.  Symmetrical Chest wall movement, Good air movement bilaterally, CTAB RRR,No Gallops,Rubs or new Murmurs, No Parasternal Heave +ve B.Sounds, Abd Soft, Non tender, No organomegaly appriciated, No rebound -guarding or rigidity. No Cyanosis, Clubbing or edema, No new Rash or bruise  PERTINENT RADIOLOGIC STUDIES: Dg Chest 1 View  Result Date: 12/07/2017 CLINICAL DATA:  Patient status post fall today. EXAM: CHEST  1 VIEW COMPARISON:  PA and lateral chest 05/14/2015 and 04/25/2009. FINDINGS: Two lead pacing device in place. There is cardiomegaly without edema. Aortic atherosclerosis is noted. No pneumothorax or pleural fluid. No acute or focal bony abnormality. IMPRESSION: No acute disease. Cardiomegaly. Atherosclerosis. Electronically Signed   By:  Inge Rise M.D.   On: 12/07/2017 11:43   Dg Pelvis 1-2 Views  Result Date: 12/07/2017 CLINICAL DATA:  Pain due to a fall today.  Initial encounter. EXAM: PELVIS - 1-2 VIEW COMPARISON:  None. FINDINGS: The patient has a nondisplaced fracture through approximately the mid aspect of the left femoral neck. No other acute bony or joint abnormality is identified. Mesh from hernia repair and atherosclerotic vascular disease are noted. IMPRESSION: Acute nondisplaced fracture mid aspect of the left femoral neck. Electronically Signed   By: Inge Rise M.D.   On: 12/07/2017 11:44   Ct Head Wo Contrast  Result Date: 12/07/2017 CLINICAL DATA:  84 year old female with unwitnessed fall this morning. Unsure if loss of consciousness. Denies head and neck pain. Initial encounter. EXAM: CT HEAD WITHOUT CONTRAST CT CERVICAL SPINE WITHOUT CONTRAST TECHNIQUE: Multidetector CT imaging of the head and cervical spine was performed following the standard protocol without intravenous contrast. Multiplanar CT image reconstructions of the cervical spine were also  generated. COMPARISON:  None. 04/18/2009 head CT. FINDINGS: CT brain findings. Brain: No intracranial hemorrhage or CT evidence of large acute infarct. Chronic microvascular changes. Moderate global atrophy. No intracranial mass lesion noted on this unenhanced exam. Vascular: Vascular calcifications Skull: No skull fracture. Remote right convexity craniotomy. Sinuses/Orbits: No acute orbital abnormality. Complete opacification left sphenoid sinus. Mucosal thickening right sphenoid sinus. Extensive thickening of bony landmarks of the sinuses and extensive prior sinus surgery. Moderate mucosal thickening surrounding ethmoid sinus region and maxillary sinuses. Other: Mastoid air cells and middle ear cavities are clear. CT CERVICAL SPINE FINDINGS Alignment: Mild curvature convex left with slight head tilt. Skull base and vertebrae: No cervical spine fracture. Soft  tissues and spinal canal: No abnormal prevertebral soft tissue swelling. Disc levels: Cervical spondylotic changes without high-grade spinal stenosis. This includes degenerative changes C1-2 articulation greater on the left and facet degenerative changes bilaterally C3-4 and C4-5 level. Partial fusion C3-4 and dens with basion. Upper chest: No worrisome abnormality. Other: 2 x 2.5 cm left lobe with thyroid gland nodule. This can be followed by thyroid ultrasound if clinically desired. IMPRESSION: 1. No skull fracture or intracranial hemorrhage. 2. No cervical spine fracture or abnormal prevertebral soft tissue swelling. Degenerative changes throughout cervical spine. Mild curvature convex left with slight head tilt. 3. Extensive prior sinus surgery with changes of chronic sinusitis. 4. 2 x 2.5 cm left lobe thyroid nodule. This can be followed by thyroid ultrasound if clinically desired. 5. Chronic microvascular changes.  Atrophy. Electronically Signed   By: Genia Del M.D.   On: 12/07/2017 11:40   Ct Cervical Spine Wo Contrast  Result Date: 12/07/2017 CLINICAL DATA:  84 year old female with unwitnessed fall this morning. Unsure if loss of consciousness. Denies head and neck pain. Initial encounter. EXAM: CT HEAD WITHOUT CONTRAST CT CERVICAL SPINE WITHOUT CONTRAST TECHNIQUE: Multidetector CT imaging of the head and cervical spine was performed following the standard protocol without intravenous contrast. Multiplanar CT image reconstructions of the cervical spine were also generated. COMPARISON:  None. 04/18/2009 head CT. FINDINGS: CT brain findings. Brain: No intracranial hemorrhage or CT evidence of large acute infarct. Chronic microvascular changes. Moderate global atrophy. No intracranial mass lesion noted on this unenhanced exam. Vascular: Vascular calcifications Skull: No skull fracture. Remote right convexity craniotomy. Sinuses/Orbits: No acute orbital abnormality. Complete opacification left sphenoid  sinus. Mucosal thickening right sphenoid sinus. Extensive thickening of bony landmarks of the sinuses and extensive prior sinus surgery. Moderate mucosal thickening surrounding ethmoid sinus region and maxillary sinuses. Other: Mastoid air cells and middle ear cavities are clear. CT CERVICAL SPINE FINDINGS Alignment: Mild curvature convex left with slight head tilt. Skull base and vertebrae: No cervical spine fracture. Soft tissues and spinal canal: No abnormal prevertebral soft tissue swelling. Disc levels: Cervical spondylotic changes without high-grade spinal stenosis. This includes degenerative changes C1-2 articulation greater on the left and facet degenerative changes bilaterally C3-4 and C4-5 level. Partial fusion C3-4 and dens with basion. Upper chest: No worrisome abnormality. Other: 2 x 2.5 cm left lobe with thyroid gland nodule. This can be followed by thyroid ultrasound if clinically desired. IMPRESSION: 1. No skull fracture or intracranial hemorrhage. 2. No cervical spine fracture or abnormal prevertebral soft tissue swelling. Degenerative changes throughout cervical spine. Mild curvature convex left with slight head tilt. 3. Extensive prior sinus surgery with changes of chronic sinusitis. 4. 2 x 2.5 cm left lobe thyroid nodule. This can be followed by thyroid ultrasound if clinically desired. 5. Chronic microvascular changes.  Atrophy.  Electronically Signed   By: Genia Del M.D.   On: 12/07/2017 11:40   Pelvis Portable  Result Date: 12/07/2017 CLINICAL DATA:  LEFT hip fracture. EXAM: OPERATIVE LEFT HIP (WITH PELVIS IF PERFORMED) 2 VIEWS TECHNIQUE: Fluoroscopic spot image(s) were submitted for interpretation post-operatively. COMPARISON:  Preoperative pelvic radiograph earlier today FINDINGS: Three cannulated screws have been placed across a nondisplaced fracture of the mid aspect of the LEFT femoral neck. IMPRESSION: Improved position and alignment.  No adverse features. Electronically Signed    By: Staci Righter M.D.   On: 12/07/2017 19:40   Dg Knee Complete 4 Views Left  Result Date: 12/07/2017 CLINICAL DATA:  Left knee pain.  Closed left hip fracture EXAM: LEFT KNEE - COMPLETE 4+ VIEW COMPARISON:  06/13/2016 FINDINGS: No evidence of fracture, dislocation, or joint effusion. Tricompartmental osteoarthritis with marginal spurring and narrowing that is generalized. Patellofemoral degeneration is the greatest. Osteopenia and atherosclerosis. IMPRESSION: 1. No acute finding. 2. Osteoarthritis and atherosclerosis. Electronically Signed   By: Monte Fantasia M.D.   On: 12/07/2017 14:49   Dg C-arm 1-60 Min  Result Date: 12/07/2017 CLINICAL DATA:  LEFT hip fracture. EXAM: OPERATIVE LEFT HIP (WITH PELVIS IF PERFORMED) 2 VIEWS TECHNIQUE: Fluoroscopic spot image(s) were submitted for interpretation post-operatively. COMPARISON:  Preoperative pelvic radiograph earlier today FINDINGS: Three cannulated screws have been placed across a nondisplaced fracture of the mid aspect of the LEFT femoral neck. IMPRESSION: Improved position and alignment.  No adverse features. Electronically Signed   By: Staci Righter M.D.   On: 12/07/2017 19:40   Dg Hip Operative Unilat W Or W/o Pelvis Left  Result Date: 12/07/2017 CLINICAL DATA:  LEFT hip fracture. EXAM: OPERATIVE LEFT HIP (WITH PELVIS IF PERFORMED) 2 VIEWS TECHNIQUE: Fluoroscopic spot image(s) were submitted for interpretation post-operatively. COMPARISON:  Preoperative pelvic radiograph earlier today FINDINGS: Three cannulated screws have been placed across a nondisplaced fracture of the mid aspect of the LEFT femoral neck. IMPRESSION: Improved position and alignment.  No adverse features. Electronically Signed   By: Staci Righter M.D.   On: 12/07/2017 19:40     PERTINENT LAB RESULTS: CBC: Recent Labs    12/08/17 0514 12/09/17 0459  WBC 9.9 10.0  HGB 11.0* 11.4*  HCT 33.8* 33.7*  PLT 224 210   CMET CMP     Component Value Date/Time   NA 138  12/09/2017 0459   K 3.3 (L) 12/09/2017 0459   CL 104 12/09/2017 0459   CO2 26 12/09/2017 0459   GLUCOSE 193 (H) 12/09/2017 0459   BUN 13 12/09/2017 0459   CREATININE 1.12 (H) 12/09/2017 0459   CREATININE 1.01 (H) 10/10/2015 1424   CALCIUM 8.2 (L) 12/09/2017 0459   PROT 7.3 12/07/2017 1007   ALBUMIN 3.5 12/07/2017 1007   AST 25 12/07/2017 1007   ALT 18 12/07/2017 1007   ALKPHOS 78 12/07/2017 1007   BILITOT 1.8 (H) 12/07/2017 1007   GFRNONAA 40 (L) 12/09/2017 0459   GFRAA 46 (L) 12/09/2017 0459    GFR Estimated Creatinine Clearance: 27.7 mL/min (A) (by C-G formula based on SCr of 1.12 mg/dL (H)). No results for input(s): LIPASE, AMYLASE in the last 72 hours. No results for input(s): CKTOTAL, CKMB, CKMBINDEX, TROPONINI in the last 72 hours. Invalid input(s): POCBNP No results for input(s): DDIMER in the last 72 hours. Recent Labs    12/07/17 1007  HGBA1C 7.1*   No results for input(s): CHOL, HDL, LDLCALC, TRIG, CHOLHDL, LDLDIRECT in the last 72 hours. No results for input(s): TSH,  T4TOTAL, T3FREE, THYROIDAB in the last 72 hours.  Invalid input(s): FREET3 No results for input(s): VITAMINB12, FOLATE, FERRITIN, TIBC, IRON, RETICCTPCT in the last 72 hours. Coags: Recent Labs    12/07/17 1848  INR 1.13   Microbiology: Recent Results (from the past 240 hour(s))  Urine culture     Status: Abnormal   Collection Time: 12/07/17 12:30 PM  Result Value Ref Range Status   Specimen Description URINE, CLEAN CATCH  Final   Special Requests   Final    NONE Performed at [facility], [place] [address], [place], [place] [address]    Culture (A)  Final    >=100,000 COLONIES/mL KLEBSIELLA PNEUMONIAE >=100,000 COLONIES/mL ESCHERICHIA COLI    Report Status 12/09/2017 FINAL  Final   Organism ID, Bacteria KLEBSIELLA PNEUMONIAE (A)  Final   Organism ID, Bacteria ESCHERICHIA COLI (A)  Final      Susceptibility   Escherichia coli - MIC*    AMPICILLIN <=2 SENSITIVE Sensitive      CEFAZOLIN <=4 SENSITIVE Sensitive     CEFTRIAXONE <=1 SENSITIVE Sensitive     CIPROFLOXACIN <=0.25 SENSITIVE Sensitive     GENTAMICIN <=1 SENSITIVE Sensitive     IMIPENEM <=0.25 SENSITIVE Sensitive     NITROFURANTOIN <=16 SENSITIVE Sensitive     TRIMETH/SULFA <=20 SENSITIVE Sensitive     AMPICILLIN/SULBACTAM <=2 SENSITIVE Sensitive     PIP/TAZO <=4 SENSITIVE Sensitive     Extended ESBL NEGATIVE Sensitive     * >=100,000 COLONIES/mL ESCHERICHIA COLI   Klebsiella pneumoniae - MIC*    AMPICILLIN RESISTANT Resistant     CEFAZOLIN <=4 SENSITIVE Sensitive     CEFTRIAXONE <=1 SENSITIVE Sensitive     CIPROFLOXACIN <=0.25 SENSITIVE Sensitive     GENTAMICIN <=1 SENSITIVE Sensitive     IMIPENEM <=0.25 SENSITIVE Sensitive     NITROFURANTOIN 64 INTERMEDIATE Intermediate     TRIMETH/SULFA <=20 SENSITIVE Sensitive     AMPICILLIN/SULBACTAM <=2 SENSITIVE Sensitive     PIP/TAZO <=4 SENSITIVE Sensitive     Extended ESBL NEGATIVE Sensitive     * >=100,000 COLONIES/mL KLEBSIELLA PNEUMONIAE    FURTHER DISCHARGE INSTRUCTIONS:  Get Medicines reviewed and adjusted: Please take all your medications with you for your next visit with your Primary MD  Laboratory/radiological data: Please request your Primary MD to go over all hospital tests and procedure/radiological results at the follow up, please ask your Primary MD to get all Hospital records sent to his/her office.  In some cases, they will be blood work, cultures and biopsy results pending at the time of your discharge. Please request that your primary care M.D. goes through all the records of your hospital data and follows up on these results.  Also Note the following: If you experience worsening of your admission symptoms, develop shortness of breath, life threatening emergency, suicidal or homicidal thoughts you must seek medical attention immediately by calling 911 or calling your MD immediately  if symptoms less severe.  You must read complete  instructions/literature along with all the possible adverse reactions/side effects for all the Medicines you take and that have been prescribed to you. Take any new Medicines after you have completely understood and accpet all the possible adverse reactions/side effects.   Do not drive when taking Pain medications or sleeping medications (Benzodaizepines)  Do not take more than prescribed Pain, Sleep and Anxiety Medications. It is not advisable to combine anxiety,sleep and pain medications without talking with your primary care practitioner  Special Instructions: If you have  smoked or chewed Tobacco  in the last 2 yrs please stop smoking, stop any regular Alcohol  and or any Recreational drug use.  Wear Seat belts while driving.  Please note: You were cared for by a hospitalist during your hospital stay. Once you are discharged, your primary care physician will handle any further medical issues. Please note that NO REFILLS for any discharge medications will be authorized once you are discharged, as it is imperative that you return to your primary care physician (or establish a relationship with a primary care physician if you do not have one) for your post hospital discharge needs so that they can reassess your need for medications and monitor your lab values.  Total Time spent coordinating discharge including counseling, education and face to face time equals 45 minutes  Signed: Oren Binet 12/09/2017 12:13 PM

## 2017-12-09 NOTE — Consult Note (Signed)
            [name] CM Primary Care Navigator  12/09/2017  [name] 08-Apr-1917 [id number]   Went to see patientat the bedsideto identify possible discharge needs. Patient was quite tearful, anxious and worried if she can be able to get into the nursing home. Provided reassurance to patient that staff will arrange and see to it that she can get into one. Patientreportshaving a "fall coming out of bed and broke left hip" that had led to this admission/ surgery. (status post percutaneous skeletal fixation left femoral neck fracture)  Patient confirms that [name] with [name] is her primary care provider.   Patientreports using [facility] [address] toobtain medications withoutdifficulty.   Patient verbalized managingherownmedications at [facility] use of "pill box" system filled every week.  Patient statesthatshedoes not drive but has a lady (private pay aide) who has been driving and providingtransportation to herdoctors'appointments.  Patientverbalized that she lives alone but has a private- pay aide who comes daily (Mon- Fri) for 6- 7 hours/ day to help with her needs at home. She also mentioned having 2 nieces [name] and [name]) who provide assistance to her when needed.  Anticipateddischarge planisskilled nursing facility (SNF- prefers [facility]) for rehab, per therapy recommendation.  Patient voiced understanding to call primary care provider's office whenshe returns back home, for a post discharge follow-up appointment within 1- 2 weeks or sooner if needed.Patient letter (with PCP's contact number) was provided as a reminder.  Explained to patient about [facility] CM services available for health management and resources at home but she states "not interested with services for now" and "my concern is to just get to the nursing home to get better". Patientindicated that she has  no pressing issues regarding health for now and diabetes that has been under control (recent A1c of 7.1). Encouraged patientand she agreed that if she changes her mind,toseekreferral fromprimary care provider to [facility] care management if deemed necessaryand appropriate for anyservices in the nearfuture.  [facility] care management information provided for future needs that she may have.   Primary care provider's office is listed as providing transition of care (TOC) follow-up.    For additional questions please contact:  Edwena Felty A. Timara Loma, BSN, RN-BC [facility] PRIMARY CARE Navigator Cell: [phone]

## 2017-12-09 NOTE — Discharge Instructions (Signed)
Diet: As you were doing prior to hospitalization   Shower:  May shower but keep the wounds dry, use an occlusive plastic wrap, NO SOAKING IN TUB.  If the bandage gets wet, change with a clean dry gauze.  If you have a splint on, leave the splint in place and keep the splint dry with a plastic bag.  Dressing:  You may change your dressing 3-5 days after surgery, unless you have a splint.  If you have a splint, then just leave the splint in place and we will change your bandages during your first follow-up appointment.    If you had hand or foot surgery, we will plan to remove your stitches in about 2 weeks in the office.  For all other surgeries, there are sticky tapes (steri-strips) on your wounds and all the stitches are absorbable.  Leave the steri-strips in place when changing your dressings, they will peel off with time, usually 2-3 weeks.  Activity:  Increase activity slowly as tolerated, but follow the weight bearing instructions below.  The rules on driving is that you can not be taking narcotics while you drive, and you must feel in control of the vehicle.    Weight Bearing:   Weight bearing as tolerated.    To prevent constipation: you may use a stool softener such as -  Colace (over the counter) 100 mg by mouth twice a day  Drink plenty of fluids (prune juice may be helpful) and high fiber foods Miralax (over the counter) for constipation as needed.    Itching:  If you experience itching with your medications, try taking only a single pain pill, or even half a pain pill at a time.  You may take up to 10 pain pills per day, and you can also use benadryl over the counter for itching or also to help with sleep.   Precautions:  If you experience chest pain or shortness of breath - call 911 immediately for transfer to the hospital emergency department!!  If you develop a fever greater that 101 F, purulent drainage from wound, increased redness or drainage from wound, or calf pain -- Call  the office at [phone]                                                Follow- Up Appointment:  Please call for an appointment to be seen in 2 weeks [place] - [phone]  Information on my medicine - ELIQUIS (apixaban)  This medication education was reviewed with me or my healthcare representative as part of my discharge preparation.  Why was Eliquis prescribed for you? Eliquis was prescribed for you to reduce the risk of a blood clot forming that can cause a stroke if you have a medical condition called atrial fibrillation (a type of irregular heartbeat).  What do You need to know about Eliquis ? Take your Eliquis TWICE DAILY - one tablet in the morning and one tablet in the evening with or without food. If you have difficulty swallowing the tablet whole please discuss with your pharmacist how to take the medication safely.  Take Eliquis exactly as prescribed by your doctor and DO NOT stop taking Eliquis without talking to the doctor who prescribed the medication.  Stopping may increase your risk of developing a stroke.  Refill your prescription before you run out.  After discharge,  you should have regular check-up appointments with your healthcare provider that is prescribing your Eliquis.  In the future your dose may need to be changed if your kidney function or weight changes by a significant amount or as you get older.  What do you do if you miss a dose? If you miss a dose, take it as soon as you remember on the same day and resume taking twice daily.  Do not take more than one dose of ELIQUIS at the same time to make up a missed dose.  Important Safety Information A possible side effect of Eliquis is bleeding. You should call your healthcare provider right away if you experience any of the following: ? Bleeding from an injury or your nose that does not stop. ? Unusual colored urine (red or dark brown) or unusual colored stools (red or black). ? Unusual bruising for  unknown reasons. ? A serious fall or if you hit your head (even if there is no bleeding).  Some medicines may interact with Eliquis and might increase your risk of bleeding or clotting while on Eliquis. To help avoid this, consult your healthcare provider or pharmacist prior to using any new prescription or non-prescription medications, including herbals, vitamins, non-steroidal anti-inflammatory drugs (NSAIDs) and supplements.  This website has more information on Eliquis (apixaban): http://www.eliquis.com/eliquis/home

## 2017-12-09 NOTE — Progress Notes (Signed)
Report called to Hinton Dyer (nurse) at [facility].  Written instructions will be sent via PTAR along with a prescription for pain.  Patient is awaiting PTAR

## 2017-12-09 NOTE — Progress Notes (Addendum)
Pt will DC to: [facility] date: 12/09/2017 Family notified: [name] (Niece) Transport by: Corey Harold  RN, patient, and facility notified of DC. Discharge Summary sent to facility. RN given number for report [phone] room 104A. DC packet on chart. Ambulance transport requested for patient.   CSW signing off.  Thurmond Butts, [place] Social Worker [phone]

## 2017-12-09 NOTE — NC FL2 (Signed)
[place] LEVEL OF CARE SCREENING TOOL     IDENTIFICATION  Patient Name: [name] Birthdate: September 05, 1916 Sex: female Admission Date (Current Location): 12/07/2017  [facility] and [place] Number:  [employer] and Address:  [place], [place] [address] [place], [place] [address]      Provider Number:    Attending Physician Name and Address:  Jonetta Osgood, MD  Relative Name and Phone Number:       Current Level of Care: Hospital Recommended Level of Care: [place] Prior Approval Number: [id number]  Date Approved/Denied:   PASRR Number:    Discharge Plan: SNF    Current Diagnoses: Patient Active Problem List   Diagnosis Date Noted  . UTI (urinary tract infection) 12/07/2017  . Fracture of femoral neck, left ([place])   . Mobitz type II atrioventricular block   . Chronic anticoagulation 05/24/2015  . Permanent atrial fibrillation ([place]) 05/14/2015  . Chronic diastolic CHF (congestive heart failure) ([place]) 05/14/2015  . Acute respiratory failure ([place]) 05/14/2015  . Elevated troponin 05/14/2015  . Type II diabetes mellitus, well controlled ([place]) 05/14/2015  . Hypothyroid 05/14/2015  . Pacemaker 02/12/2012  . History of colon cancer 03/25/2011  . Edema 02/19/2011  . COLITIS 12/06/2008  . DIARRHEA 11/23/2008  . CAD S/P percutaneous coronary angioplasty 09/28/2008  . VENTRAL HERNIA 09/28/2008  . SYNCOPE 09/28/2008  . HYPERLIPIDEMIA 08/10/2007  . Essential hypertension 08/10/2007  . INTERNAL HEMORRHOIDS 08/10/2007  . DEGENERATIVE JOINT DISEASE 08/10/2007  . DIVERTICULOSIS OF COLON 02/17/2007    Orientation RESPIRATION BLADDER Height & Weight     Self, Time, Situation, Place  Normal Continent Weight: 171 lb (77.6 kg) Height:  5\' 3"  (160 cm)  BEHAVIORAL SYMPTOMS/MOOD NEUROLOGICAL BOWEL NUTRITION STATUS      Continent Diet(see Discharge Plan)  AMBULATORY STATUS COMMUNICATION OF NEEDS Skin      Verbally Other (Comment)(incision(closed) hip left)                       Personal Care Assistance Level of Assistance  Bathing, Feeding, Dressing Bathing Assistance: Maximum assistance Feeding assistance: Limited assistance Dressing Assistance: Maximum assistance     Functional Limitations Info  Sight, Hearing, Speech Sight Info: Impaired Hearing Info: Impaired Speech Info: Adequate    SPECIAL CARE FACTORS FREQUENCY  PT (By licensed PT)     PT Frequency: 3x per/wk              Contractures Contractures Info: Not present    Additional Factors Info  Code Status, Allergies(insulin aspart (novoLOG) injection 0-5 Units   daily at bedtime  insulin aspart (novoLOG) injection 0-9 Units 3x daily with meals ) Code Status Info: DNR Allergies Info: Diclofenac Sodium, Hytrin Terazosin Hcl, Ibuprofen, Norvasc Amlodipine Besylate, Plavix Clopidogrel Bisulfate           Current Medications (12/09/2017):  This is the current hospital active medication list Current Facility-Administered Medications  Medication Dose Route Frequency Provider Last Rate Last Dose  . acetaminophen (TYLENOL) tablet 325-650 mg  325-650 mg Oral Q6H PRN [name], MD   650 mg at 12/09/17 0348  . alum & mag hydroxide-simeth (MAALOX/MYLANTA) 200-200-20 MG/5ML suspension 30 mL  30 mL Oral Q4H PRN [name], MD      . apixaban Arne Cleveland) tablet 5 mg  5 mg Oral BID [name], MD   5 mg at 12/08/17 2224  . bisacodyl (DULCOLAX) suppository 10 mg  10 mg Rectal  Daily PRN [name], MD      . docusate sodium (COLACE) capsule 100 mg  100 mg Oral BID [name], MD   100 mg at 12/08/17 2224  . ferrous sulfate tablet 325 mg  325 mg Oral TID Emiliano Dyer, MD   325 mg at 12/09/17 0854  . furosemide (LASIX) tablet 40 mg  40 mg Oral Daily [name], MD   40 mg at 12/08/17 3716  . HYDROcodone-acetaminophen (NORCO) 7.5-325 MG per tablet 1-2 tablet  1-2 tablet Oral Q4H PRN [name], MD       . HYDROcodone-acetaminophen (NORCO/VICODIN) 5-325 MG per tablet 1-2 tablet  1-2 tablet Oral Q4H PRN [name], MD      . insulin aspart (novoLOG) injection 0-5 Units  0-5 Units Subcutaneous QHS [name], MD      . insulin aspart (novoLOG) injection 0-9 Units  0-9 Units Subcutaneous TID WC [name], MD   2 Units at 12/09/17 0831  . levothyroxine (SYNTHROID, LEVOTHROID) tablet 88 mcg  88 mcg Oral QAC breakfast [name], MD   88 mcg at 12/09/17 0855  . losartan (COZAAR) tablet 100 mg  100 mg Oral Daily [name], MD   100 mg at 12/08/17 9678  . magnesium citrate solution 1 Bottle  1 Bottle Oral Once PRN [name], MD      . menthol-cetylpyridinium (CEPACOL) lozenge 3 mg  1 lozenge Oral PRN [name], MD       Or  . phenol (CHLORASEPTIC) mouth spray 1 spray  1 spray Mouth/Throat PRN [name], MD      . metoCLOPramide (REGLAN) tablet 5-10 mg  5-10 mg Oral Q8H PRN [name], MD       Or  . metoCLOPramide (REGLAN) injection 5-10 mg  5-10 mg Intravenous Q8H PRN [name], MD      . morphine 2 MG/ML injection 0.5-1 mg  0.5-1 mg Intravenous Q2H PRN [name], MD   0.5 mg at 12/09/17 0618  . nitroGLYCERIN (NITROSTAT) SL tablet 0.4 mg  0.4 mg Sublingual Q5 min PRN [name], MD      . ondansetron [facility]) tablet 4 mg  4 mg Oral Q6H PRN [name], MD       Or  . ondansetron [facility]) injection 4 mg  4 mg Intravenous Q6H PRN [name], MD      . polyethylene glycol (MIRALAX / GLYCOLAX) packet 17 g  17 g Oral Daily Ghimire, Shanker M, MD      . potassium chloride SA (K-DUR,KLOR-CON) CR tablet 40 mEq  40 mEq Oral Once Jonetta Osgood, MD      . simvastatin (ZOCOR) tablet 40 mg  40 mg Oral QHS [name], MD   40 mg at 12/08/17 2242  . zolpidem (AMBIEN) tablet 5 mg  5 mg Oral QHS PRN [name], MD         Discharge Medications: Please see discharge summary for a list of discharge medications.  Relevant Imaging  Results:  Relevant Lab Results:   Additional Information SSN [id number]  Vinie Sill, LCSWA

## 2017-12-23 ENCOUNTER — Other Ambulatory Visit: Payer: Self-pay

## 2017-12-23 ENCOUNTER — Inpatient Hospital Stay (HOSPITAL_COMMUNITY)
Admission: EM | Admit: 2017-12-23 | Discharge: 2017-12-26 | DRG: 470 | Disposition: A | Discharge status: Skilled Nursing Facility | Payer: PPO | Attending: Internal Medicine | Admitting: Internal Medicine

## 2017-12-23 ENCOUNTER — Emergency Department (HOSPITAL_COMMUNITY): Payer: PPO

## 2017-12-23 DIAGNOSIS — I2510 Atherosclerotic heart disease of native coronary artery without angina pectoris: Secondary | ICD-10-CM

## 2017-12-23 DIAGNOSIS — Z66 Do not resuscitate: Secondary | ICD-10-CM | POA: Diagnosis not present

## 2017-12-23 DIAGNOSIS — Z471 Aftercare following joint replacement surgery: Secondary | ICD-10-CM | POA: Diagnosis not present

## 2017-12-23 DIAGNOSIS — K5900 Constipation, unspecified: Secondary | ICD-10-CM | POA: Diagnosis not present

## 2017-12-23 DIAGNOSIS — I10 Essential (primary) hypertension: Secondary | ICD-10-CM | POA: Diagnosis present

## 2017-12-23 DIAGNOSIS — I480 Paroxysmal atrial fibrillation: Secondary | ICD-10-CM | POA: Diagnosis not present

## 2017-12-23 DIAGNOSIS — S7292XA Unspecified fracture of left femur, initial encounter for closed fracture: Secondary | ICD-10-CM | POA: Diagnosis not present

## 2017-12-23 DIAGNOSIS — S72043A Displaced fracture of base of neck of unspecified femur, initial encounter for closed fracture: Secondary | ICD-10-CM | POA: Diagnosis not present

## 2017-12-23 DIAGNOSIS — R339 Retention of urine, unspecified: Secondary | ICD-10-CM | POA: Diagnosis not present

## 2017-12-23 DIAGNOSIS — Z7401 Bed confinement status: Secondary | ICD-10-CM | POA: Diagnosis not present

## 2017-12-23 DIAGNOSIS — R8271 Bacteriuria: Secondary | ICD-10-CM | POA: Diagnosis not present

## 2017-12-23 DIAGNOSIS — R609 Edema, unspecified: Secondary | ICD-10-CM | POA: Diagnosis not present

## 2017-12-23 DIAGNOSIS — I482 Chronic atrial fibrillation: Secondary | ICD-10-CM | POA: Diagnosis present

## 2017-12-23 DIAGNOSIS — S72002G Fracture of unspecified part of neck of left femur, subsequent encounter for closed fracture with delayed healing: Secondary | ICD-10-CM

## 2017-12-23 DIAGNOSIS — E1165 Type 2 diabetes mellitus with hyperglycemia: Secondary | ICD-10-CM | POA: Diagnosis present

## 2017-12-23 DIAGNOSIS — I4821 Permanent atrial fibrillation: Secondary | ICD-10-CM | POA: Diagnosis present

## 2017-12-23 DIAGNOSIS — Z85038 Personal history of other malignant neoplasm of large intestine: Secondary | ICD-10-CM

## 2017-12-23 DIAGNOSIS — Z950 Presence of cardiac pacemaker: Secondary | ICD-10-CM | POA: Diagnosis present

## 2017-12-23 DIAGNOSIS — S72002A Fracture of unspecified part of neck of left femur, initial encounter for closed fracture: Secondary | ICD-10-CM | POA: Diagnosis not present

## 2017-12-23 DIAGNOSIS — J90 Pleural effusion, not elsewhere classified: Secondary | ICD-10-CM | POA: Diagnosis not present

## 2017-12-23 DIAGNOSIS — E119 Type 2 diabetes mellitus without complications: Secondary | ICD-10-CM | POA: Diagnosis not present

## 2017-12-23 DIAGNOSIS — T84091A Other mechanical complication of internal left hip prosthesis, initial encounter: Secondary | ICD-10-CM | POA: Diagnosis not present

## 2017-12-23 DIAGNOSIS — D62 Acute posthemorrhagic anemia: Secondary | ICD-10-CM | POA: Diagnosis not present

## 2017-12-23 DIAGNOSIS — E039 Hypothyroidism, unspecified: Secondary | ICD-10-CM | POA: Diagnosis present

## 2017-12-23 DIAGNOSIS — N390 Urinary tract infection, site not specified: Secondary | ICD-10-CM | POA: Diagnosis present

## 2017-12-23 DIAGNOSIS — Z96642 Presence of left artificial hip joint: Secondary | ICD-10-CM | POA: Diagnosis not present

## 2017-12-23 DIAGNOSIS — I5032 Chronic diastolic (congestive) heart failure: Secondary | ICD-10-CM | POA: Diagnosis not present

## 2017-12-23 DIAGNOSIS — Z9861 Coronary angioplasty status: Secondary | ICD-10-CM

## 2017-12-23 DIAGNOSIS — R600 Localized edema: Secondary | ICD-10-CM | POA: Diagnosis not present

## 2017-12-23 DIAGNOSIS — Z7901 Long term (current) use of anticoagulants: Secondary | ICD-10-CM

## 2017-12-23 DIAGNOSIS — S72002D Fracture of unspecified part of neck of left femur, subsequent encounter for closed fracture with routine healing: Secondary | ICD-10-CM | POA: Diagnosis not present

## 2017-12-23 DIAGNOSIS — M25552 Pain in left hip: Secondary | ICD-10-CM | POA: Diagnosis not present

## 2017-12-23 DIAGNOSIS — E222 Syndrome of inappropriate secretion of antidiuretic hormone: Secondary | ICD-10-CM | POA: Diagnosis not present

## 2017-12-23 DIAGNOSIS — L89623 Pressure ulcer of left heel, stage 3: Secondary | ICD-10-CM | POA: Diagnosis not present

## 2017-12-23 DIAGNOSIS — Z888 Allergy status to other drugs, medicaments and biological substances status: Secondary | ICD-10-CM | POA: Diagnosis not present

## 2017-12-23 DIAGNOSIS — I441 Atrioventricular block, second degree: Secondary | ICD-10-CM | POA: Diagnosis not present

## 2017-12-23 DIAGNOSIS — W19XXXD Unspecified fall, subsequent encounter: Secondary | ICD-10-CM | POA: Diagnosis present

## 2017-12-23 DIAGNOSIS — Z8781 Personal history of (healed) traumatic fracture: Secondary | ICD-10-CM

## 2017-12-23 DIAGNOSIS — Z79899 Other long term (current) drug therapy: Secondary | ICD-10-CM | POA: Diagnosis not present

## 2017-12-23 DIAGNOSIS — Y838 Other surgical procedures as the cause of abnormal reaction of the patient, or of later complication, without mention of misadventure at the time of the procedure: Secondary | ICD-10-CM | POA: Diagnosis present

## 2017-12-23 DIAGNOSIS — T84115A Breakdown (mechanical) of internal fixation device of left femur, initial encounter: Principal | ICD-10-CM | POA: Diagnosis present

## 2017-12-23 DIAGNOSIS — E785 Hyperlipidemia, unspecified: Secondary | ICD-10-CM | POA: Diagnosis present

## 2017-12-23 DIAGNOSIS — M2550 Pain in unspecified joint: Secondary | ICD-10-CM | POA: Diagnosis not present

## 2017-12-23 DIAGNOSIS — Z4801 Encounter for change or removal of surgical wound dressing: Secondary | ICD-10-CM | POA: Diagnosis not present

## 2017-12-23 DIAGNOSIS — F329 Major depressive disorder, single episode, unspecified: Secondary | ICD-10-CM | POA: Diagnosis not present

## 2017-12-23 DIAGNOSIS — I110 Hypertensive heart disease with heart failure: Secondary | ICD-10-CM | POA: Diagnosis present

## 2017-12-23 DIAGNOSIS — E43 Unspecified severe protein-calorie malnutrition: Secondary | ICD-10-CM | POA: Diagnosis not present

## 2017-12-23 DIAGNOSIS — Z9889 Other specified postprocedural states: Secondary | ICD-10-CM

## 2017-12-23 DIAGNOSIS — F419 Anxiety disorder, unspecified: Secondary | ICD-10-CM | POA: Diagnosis not present

## 2017-12-23 DIAGNOSIS — M810 Age-related osteoporosis without current pathological fracture: Secondary | ICD-10-CM | POA: Diagnosis not present

## 2017-12-23 LAB — CBC WITH DIFFERENTIAL/PLATELET
ABS IMMATURE GRANULOCYTES: 0 10*3/uL (ref 0.0–0.1)
BASOS PCT: 0 %
Basophils Absolute: 0 10*3/uL (ref 0.0–0.1)
EOS PCT: 3 %
Eosinophils Absolute: 0.3 10*3/uL (ref 0.0–0.7)
HCT: 35.9 % — ABNORMAL LOW (ref 36.0–46.0)
Hemoglobin: 11.8 g/dL — ABNORMAL LOW (ref 12.0–15.0)
Immature Granulocytes: 0 %
Lymphocytes Relative: 19 %
Lymphs Abs: 1.9 10*3/uL (ref 0.7–4.0)
MCH: 31.3 pg (ref 26.0–34.0)
MCHC: 32.9 g/dL (ref 30.0–36.0)
MCV: 95.2 fL (ref 78.0–100.0)
MONO ABS: 0.6 10*3/uL (ref 0.1–1.0)
MONOS PCT: 6 %
Neutro Abs: 7 10*3/uL (ref 1.7–7.7)
Neutrophils Relative %: 72 %
PLATELETS: 437 10*3/uL — AB (ref 150–400)
RBC: 3.77 MIL/uL — ABNORMAL LOW (ref 3.87–5.11)
RDW: 13.5 % (ref 11.5–15.5)
WBC: 9.9 10*3/uL (ref 4.0–10.5)

## 2017-12-23 LAB — COMPREHENSIVE METABOLIC PANEL
ALK PHOS: 117 U/L (ref 38–126)
ALT: 14 U/L (ref 0–44)
ANION GAP: 10 (ref 5–15)
AST: 20 U/L (ref 15–41)
Albumin: 3.1 g/dL — ABNORMAL LOW (ref 3.5–5.0)
BUN: 12 mg/dL (ref 8–23)
CALCIUM: 9 mg/dL (ref 8.9–10.3)
CHLORIDE: 103 mmol/L (ref 98–111)
CO2: 24 mmol/L (ref 22–32)
CREATININE: 0.88 mg/dL (ref 0.44–1.00)
GFR calc Af Amer: >60 mL/min (ref >60)
GFR, EST NON AFRICAN AMERICAN: 53 mL/min — AB (ref >60)
Glucose, Bld: 246 mg/dL — ABNORMAL HIGH (ref 70–99)
Potassium: 3.9 mmol/L (ref 3.5–5.1)
Sodium: 137 mmol/L (ref 135–145)
Total Bilirubin: 1 mg/dL (ref 0.3–1.2)
Total Protein: 7.1 g/dL (ref 6.5–8.1)

## 2017-12-23 LAB — BRAIN NATRIURETIC PEPTIDE: B NATRIURETIC PEPTIDE 5: 206 pg/mL — AB (ref 0.0–100.0)

## 2017-12-23 LAB — TROPONIN I

## 2017-12-23 LAB — SURGICAL PCR SCREEN
MRSA, PCR: NEGATIVE
STAPHYLOCOCCUS AUREUS: NEGATIVE

## 2017-12-23 LAB — GLUCOSE, CAPILLARY: Glucose-Capillary: 224 mg/dL — ABNORMAL HIGH (ref 70–99)

## 2017-12-23 LAB — CBG MONITORING, ED: Glucose-Capillary: 149 mg/dL — ABNORMAL HIGH (ref 70–99)

## 2017-12-23 MED ORDER — INSULIN ASPART 100 UNIT/ML ~~LOC~~ SOLN
0.0000 [IU] | Freq: Three times a day (TID) | SUBCUTANEOUS | Status: DC
  Administered 2017-12-23: 1 [IU] via SUBCUTANEOUS
  Administered 2017-12-25: 2 [IU] via SUBCUTANEOUS
  Administered 2017-12-25: 5 [IU] via SUBCUTANEOUS
  Administered 2017-12-25: 9 [IU] via SUBCUTANEOUS
  Administered 2017-12-26 (×2): 2 [IU] via SUBCUTANEOUS
  Filled 2017-12-23: qty 1

## 2017-12-23 MED ORDER — LEVOTHYROXINE SODIUM 88 MCG PO TABS
88.0000 ug | ORAL_TABLET | Freq: Every day | ORAL | Status: DC
  Administered 2017-12-24 – 2017-12-26 (×3): 88 ug via ORAL
  Filled 2017-12-23 (×3): qty 1

## 2017-12-23 MED ORDER — FUROSEMIDE 40 MG PO TABS
40.0000 mg | ORAL_TABLET | Freq: Every day | ORAL | Status: DC
  Administered 2017-12-24 – 2017-12-26 (×3): 40 mg via ORAL
  Filled 2017-12-23 (×3): qty 1

## 2017-12-23 MED ORDER — CHLORHEXIDINE GLUCONATE 4 % EX LIQD
60.0000 mL | Freq: Once | CUTANEOUS | Status: DC
  Filled 2017-12-23: qty 60

## 2017-12-23 MED ORDER — HYDROMORPHONE HCL 1 MG/ML IJ SOLN
0.5000 mg | INTRAMUSCULAR | Status: DC | PRN
  Administered 2017-12-23: 0.5 mg via INTRAVENOUS
  Filled 2017-12-23: qty 1

## 2017-12-23 MED ORDER — INSULIN ASPART 100 UNIT/ML ~~LOC~~ SOLN
0.0000 [IU] | Freq: Every day | SUBCUTANEOUS | Status: DC
  Administered 2017-12-23 – 2017-12-24 (×2): 2 [IU] via SUBCUTANEOUS
  Administered 2017-12-26: 3 [IU] via SUBCUTANEOUS

## 2017-12-23 MED ORDER — POTASSIUM CHLORIDE CRYS ER 20 MEQ PO TBCR
20.0000 meq | EXTENDED_RELEASE_TABLET | Freq: Every day | ORAL | Status: DC
  Administered 2017-12-24 – 2017-12-26 (×3): 20 meq via ORAL
  Filled 2017-12-23 (×3): qty 1

## 2017-12-23 MED ORDER — HYDROMORPHONE HCL 1 MG/ML IJ SOLN
0.5000 mg | Freq: Once | INTRAMUSCULAR | Status: AC
  Administered 2017-12-23: 0.5 mg via INTRAVENOUS
  Filled 2017-12-23: qty 1

## 2017-12-23 MED ORDER — FERROUS SULFATE 325 (65 FE) MG PO TABS
325.0000 mg | ORAL_TABLET | Freq: Two times a day (BID) | ORAL | Status: DC
  Administered 2017-12-23 – 2017-12-25 (×5): 325 mg via ORAL
  Filled 2017-12-23 (×5): qty 1

## 2017-12-23 MED ORDER — ENOXAPARIN SODIUM 40 MG/0.4ML ~~LOC~~ SOLN: 40.0000 mg | SUBCUTANEOUS | Status: DC

## 2017-12-23 MED ORDER — SODIUM CHLORIDE 0.9 % IV SOLN
INTRAVENOUS | Status: DC
  Administered 2017-12-23 – 2017-12-24 (×3): via INTRAVENOUS

## 2017-12-23 MED ORDER — SIMVASTATIN 40 MG PO TABS
40.0000 mg | ORAL_TABLET | Freq: Every day | ORAL | Status: DC
  Administered 2017-12-23 – 2017-12-25 (×3): 40 mg via ORAL
  Filled 2017-12-23 (×3): qty 1

## 2017-12-23 MED ORDER — ONDANSETRON HCL 4 MG/2ML IJ SOLN: 4.0000 mg | Freq: Three times a day (TID) | INTRAMUSCULAR | Status: DC | PRN

## 2017-12-23 MED ORDER — LOSARTAN POTASSIUM 50 MG PO TABS
100.0000 mg | ORAL_TABLET | Freq: Every day | ORAL | Status: DC
  Administered 2017-12-24 – 2017-12-26 (×3): 100 mg via ORAL
  Filled 2017-12-23 (×3): qty 2

## 2017-12-23 MED ORDER — CIPROFLOXACIN HCL 250 MG PO TABS
250.0000 mg | ORAL_TABLET | Freq: Two times a day (BID) | ORAL | Status: DC
  Administered 2017-12-23 – 2017-12-24 (×2): 250 mg via ORAL
  Filled 2017-12-23 (×2): qty 1

## 2017-12-23 MED ORDER — SODIUM CHLORIDE 0.9 % IV SOLN
1000.0000 mg | INTRAVENOUS | Status: AC
  Administered 2017-12-24: 1000 mg via INTRAVENOUS
  Filled 2017-12-23: qty 1000

## 2017-12-23 MED ORDER — ONDANSETRON HCL 4 MG/2ML IJ SOLN
4.0000 mg | Freq: Once | INTRAMUSCULAR | Status: AC
  Administered 2017-12-23: 4 mg via INTRAVENOUS
  Filled 2017-12-23: qty 2

## 2017-12-23 MED ORDER — HYDROCODONE-ACETAMINOPHEN 5-325 MG PO TABS
1.0000 | ORAL_TABLET | Freq: Four times a day (QID) | ORAL | Status: DC | PRN
  Administered 2017-12-23: 1 via ORAL
  Filled 2017-12-23: qty 1

## 2017-12-23 MED ORDER — FUROSEMIDE 40 MG PO TABS
40.0000 mg | ORAL_TABLET | Freq: Every day | ORAL | Status: DC
  Administered 2017-12-23: 40 mg via ORAL
  Filled 2017-12-23: qty 1

## 2017-12-23 MED ORDER — NITROGLYCERIN 0.4 MG SL SUBL: 0.4000 mg | SUBLINGUAL_TABLET | SUBLINGUAL | Status: DC | PRN

## 2017-12-23 MED ORDER — POVIDONE-IODINE 10 % EX SWAB: 2.0000 "application " | Freq: Once | CUTANEOUS | Status: DC

## 2017-12-23 MED ORDER — POLYETHYLENE GLYCOL 3350 17 G PO PACK: 17.0000 g | PACK | Freq: Every day | ORAL | Status: DC

## 2017-12-23 NOTE — Consult Note (Signed)
ORTHOPAEDIC CONSULTATION  REQUESTING PHYSICIAN: Fredia Sorrow, MD  Chief Complaint: Left hip pain  HPI: [name] is a 84 y.o. female who complains of left hip pain ever since breaking it a couple of weeks ago.  She underwent cannulated hip pinning for nondisplaced femoral neck fracture, and has not had any real falls but has had progressive dysfunction and increasing pain around the left hip.  The family is also noted that she had a urinary tract infection and has had increased swelling in both of her legs concerning for congestive heart failure.  This is while she has been at the care facility.  She has difficulty with ambulation, and her cognitive function is also been declining to some degree.  Past Medical History:  Diagnosis Date  . Atrial fibrillation ([place])   . CAD (coronary artery disease)   . Colitis   . Colon cancer ([place])   . Diverticulosis of colon   . DJD (degenerative joint disease)   . DM (diabetes mellitus) ([place])   . Hip fracture ([place])   . HLD (hyperlipidemia)   . HTN (hypertension)   . Hypothyroidism   . Mobitz (type) II atrioventricular block   . Ventral hernia    Past Surgical History:  Procedure Laterality Date  . APPENDECTOMY    . CARDIAC CATHETERIZATION  08/22/1997  . CORONARY ANGIOPLASTY  07/03/2008  . HEMICOLECTOMY  10/07   right  . HIP PINNING,CANNULATED Left 12/07/2017   Procedure: CANNULATED HIP PINNING;  Surgeon: [name], MD;  Location: [facility];  Service: Orthopedics;  Laterality: Left;  . PACEMAKER INSERTION  07/06/08   MDT [id number] implanted for Mobitz II heart block by Dr Rayann Heman  . ROTATOR CUFF REPAIR    . TRANSTHORACIC ECHOCARDIOGRAM  06/29/2008   EF 55-60%   Social History   Socioeconomic History  . Marital status: Widowed    Spouse name: Not on file  . Number of children: Not on file  . Years of education: Not on file  . Highest education level: Not on file  Occupational History  . Not on file  Social Needs  . Financial  resource strain: Not on file  . Food insecurity:    Worry: Not on file    Inability: Not on file  . Transportation needs:    Medical: Not on file    Non-medical: Not on file  Tobacco Use  . Smoking status: Never Smoker  . Smokeless tobacco: Never Used  Substance and Sexual Activity  . Alcohol use: No  . Drug use: No  . Sexual activity: Not on file  Lifestyle  . Physical activity:    Days per week: Not on file    Minutes per session: Not on file  . Stress: Not on file  Relationships  . Social connections:    Talks on phone: Not on file    Gets together: Not on file    Attends religious service: Not on file    Active member of club or organization: Not on file    Attends meetings of clubs or organizations: Not on file    Relationship status: Not on file  Other Topics Concern  . Not on file  Social History Narrative          She is a widow.  She lives by herself.  She did have 2           children who both died at young age.  Both of the children were invalids  from birth because of birth injuries but lived to the ages of 67 and 24           respectivel   Family History  Problem Relation Age of Onset  . Heart attack Father    Allergies  Allergen Reactions  . Diclofenac Sodium     UNKNOWN  . Hytrin [Terazosin Hcl]     UNKNOWN   . Ibuprofen     UNKNOWN   . Norvasc [Amlodipine Besylate]     UNKNOWN   . Plavix [Clopidogrel Bisulfate]     UNKNOWN      Positive ROS: All other systems have been reviewed and were otherwise negative with the exception of those mentioned in the HPI and as above.  Physical Exam: General: She is lethargic, although in no acute distress and is arousable. Cardiovascular: 1+ pedal edema bilaterally, may be even 2+ Respiratory: She seems to have a little bit of labored breathing, with some wheezing GI: No organomegaly, abdomen is soft and non-tender Skin: No lesions in the area of chief complaint, her surgical wound is healing  well Neurologic: Sensation intact distally Psychiatric: Patient is marginally competent for consent, her power of attorney is with her  Lymphatic: No axillary or cervical lymphadenopathy  MUSCULOSKELETAL: Left hip has extreme pain with active and passive motion.  EHL is intact.  Assessment: Left femoral neck fracture with collapse, status post percutaneous pinning  Plan: Unfortunately, I do not think that she has held with her percutaneous pins.  I recommended surgical intervention.  The fracture pattern appears to now be somewhat varus, and more displaced, and I think her increased pain is secondary to fracture instability.  The risks benefits and alternatives were discussed with the patient including but not limited to the risks of nonoperative treatment, versus surgical intervention including infection, bleeding, nerve injury, periprosthetic fracture, the need for revision surgery, dislocation, leg length discrepancy, blood clots, cardiopulmonary complications, morbidity, mortality, among others, and they were willing to proceed.    Planned to be n.p.o. after midnight tonight, and proceed with surgery tomorrow if she is medically optimized.   Johnny Bridge, MD Cell [phone]   12/23/2017 12:15 PM

## 2017-12-23 NOTE — H&P (Addendum)
History and Physical    [name] HBZ:[id number] DOB: [birth year] DOA: 12/23/2017  PCP: Leeroy Cha, MD Consultants:  Ortho Patient coming from: SNF  Chief Complaint: L hip pain  HPI: [name] is a 84 y.o. female with medical history significant for hypothyroidism, HTN, HLD, DM, colon CA, CAD s/p PTCA, h/o Mobitz type II AVB s/p PPM, and permanent AF on Eliquis who had a nondisplaced L hip fracture 2 weeks ago. She underwent cannulated hip pinning on 7/29 and since that time has had worsening pain of L hip and progressive dysfunction of the limb. She has also had a UTI since she was discharged to SNF. She is on day 3 of cipro 250 mg BID. Unclear if she had a culture sent. She has difficulty with ambulation and according to her family she has had some cognitive decline, especially since her hip fracture. She is extremely HOH. Her daughter [name] also reports that pt has had increasing BLE edema since being at [facility]. Pt denies chest discomfort, dyspnea, palpitations, N/V/D, abdominal pain. She is a diabetic, not on insulin. She was sent to the ED after ortho decided she needed admission and redo L hip surgery (planned L THA for tomorrow 8/15 at 1 pm with Dr. Mardelle Matte.)  ED Course:  Afebrile, initially hypertensive which resolved when pain was treated BNP 206 (has been in 400s previously) CXR Looks ok, small bil pleural effusion, heart mildly enlarged, pulmonary vascularity normal.  Has PPM  EKG LBBB, HR 60 Trop neg  Review of Systems: As per HPI; otherwise review of systems reviewed and negative.   Ambulatory Status: Was ambulatory prior to fracture  Past Medical History:  Diagnosis Date  . Atrial fibrillation ([place])   . CAD (coronary artery disease)   . Colitis   . Colon cancer ([place])   . Diverticulosis of colon   . DJD (degenerative joint disease)   . DM (diabetes mellitus) ([place])   . Hip fracture ([place])   . HLD (hyperlipidemia)   . HTN (hypertension)   .  Hypothyroidism   . Mobitz (type) II atrioventricular block   . Ventral hernia     Past Surgical History:  Procedure Laterality Date  . APPENDECTOMY    . CARDIAC CATHETERIZATION  08/22/1997  . CORONARY ANGIOPLASTY  07/03/2008  . HEMICOLECTOMY  10/07   right  . HIP PINNING,CANNULATED Left 12/07/2017   Procedure: CANNULATED HIP PINNING;  Surgeon: [name], MD;  Location: [facility];  Service: Orthopedics;  Laterality: Left;  . PACEMAKER INSERTION  07/06/08   MDT [id number] implanted for Mobitz II heart block by Dr Rayann Heman  . ROTATOR CUFF REPAIR    . TRANSTHORACIC ECHOCARDIOGRAM  06/29/2008   EF 55-60%    Social History   Socioeconomic History  . Marital status: Widowed    Spouse name: Not on file  . Number of children: Not on file  . Years of education: Not on file  . Highest education level: Not on file  Occupational History  . Not on file  Social Needs  . Financial resource strain: Not on file  . Food insecurity:    Worry: Not on file    Inability: Not on file  . Transportation needs:    Medical: Not on file    Non-medical: Not on file  Tobacco Use  . Smoking status: Never Smoker  . Smokeless tobacco: Never Used  Substance and Sexual Activity  . Alcohol use: No  . Drug use: No  . Sexual activity:  Not on file  Lifestyle  . Physical activity:    Days per week: Not on file    Minutes per session: Not on file  . Stress: Not on file  Relationships  . Social connections:    Talks on phone: Not on file    Gets together: Not on file    Attends religious service: Not on file    Active member of club or organization: Not on file    Attends meetings of clubs or organizations: Not on file    Relationship status: Not on file  . Intimate partner violence:    Fear of current or ex partner: Not on file    Emotionally abused: Not on file    Physically abused: Not on file    Forced sexual activity: Not on file  Other Topics Concern  . Not on file  Social History Narrative           She is a widow.  She lives by herself.  She did have 2           children who both died at young age.  Both of the children were invalids           from birth because of birth injuries but lived to the ages of 11 and 74           respectivel    Allergies  Allergen Reactions  . Diclofenac Sodium     UNSPECIFIED REACTION   . Hytrin [Terazosin Hcl]     UNSPECIFIED REACTION    . Ibuprofen     UNSPECIFIED REACTION    . Norvasc [Amlodipine Besylate]     UNSPECIFIED REACTION    . Plavix [Clopidogrel Bisulfate]     UNSPECIFIED REACTION      Family History  Problem Relation Age of Onset  . Heart attack Father     Prior to Admission medications   Medication Sig Start Date End Date Taking? Authorizing Provider  ELIQUIS 5 MG TABS tablet TAKE 1 TABLET BY MOUTH TWICE DAILY 10/27/17   [place], Peter M, MD  ferrous sulfate 325 (65 FE) MG tablet Take 1 tablet (325 mg total) by mouth 2 (two) times daily with a meal. 12/09/17   Ghimire, Henreitta Leber, MD  furosemide (LASIX) 40 MG tablet Take 1 tablet (40 mg total) by mouth daily. 11/10/12   [place], Peter M, MD  HYDROcodone-acetaminophen (NORCO/VICODIN) 5-325 MG tablet Take 1-2 tablets by mouth every 6 (six) hours as needed for moderate pain (pain score 4-6). 12/09/17   Ghimire, Henreitta Leber, MD  levothyroxine (SYNTHROID, LEVOTHROID) 88 MCG tablet Take 88 mcg by mouth daily. 11/30/17   [provider]  losartan (COZAAR) 100 MG tablet Take 1 tablet (100 mg total) by mouth daily. 11/03/16   [place], Peter M, MD  nitroGLYCERIN (NITROSTAT) 0.4 MG SL tablet Place 0.4 mg under the tongue every 5 (five) minutes as needed for chest pain.    [provider]  polyethylene glycol (MIRALAX / GLYCOLAX) packet Take 17 g by mouth daily. 12/09/17   Ghimire, Henreitta Leber, MD  potassium chloride SA (K-DUR,KLOR-CON) 20 MEQ tablet Take 1 tablet (20 mEq total) by mouth daily. 12/09/17   Ghimire, Henreitta Leber, MD  simvastatin (ZOCOR) 40 MG tablet Take 1 tablet (40 mg  total) by mouth at bedtime. 11/10/12   [place], Peter M, MD  Probiotic Product (ALIGN PO) Take 1 capsule by mouth daily.    02/19/11  [provider]  Physical Exam: Vitals:   12/23/17 1110 12/23/17 1145 12/23/17 1315 12/23/17 1400  BP: (!) 161/68 (!) 177/82 (!) 164/68 (!) 161/67  Pulse: 60 62 61 62  Resp: 18 16 (!) 22 (!) 27  Temp: 97.8 F (36.6 C)     TempSrc: Oral     SpO2: 100% 99% 99% 99%     . General:  Appears calm and comfortable and is in NAD . Eyes:  PERRL, EOMI, normal lids, iris . ENT:  Very HOH, normal lips & tongue, mmm . Neck:  no LAD, masses or thyromegaly; no carotid bruits . Cardiovascular:  normal rate, reg rhythm, no murmur. Mar[place] Kitchen Respiratory: CTA bilaterally with no wheezes/rales/rhonchi.  Normal respiratory effort. . Abdomen:  soft, NT, ND, NABS . Skin:  no rash or induration seen on limited exam . Musculoskeletal:  grossly normal tone BUE/BLE . Lower extremity: 2+ bil pitting edema to upper shins.  Limited foot exam with no ulcerations.  2+ distal pulses. Marland Kitchen Psychiatric:  grossly normal mood and affect, speech is hesitant but appropriate and likely related to her HOH. AOx3 . Neurologic:  CN 2-12 grossly intact, moves all extremities in coordinated fashion except did not examine L hip movement; sensation intact   Radiological Exams on Admission: Dg Chest 2 View  Result Date: 12/23/2017 CLINICAL DATA:  Recent hip fracture.  Atrial fibrillation EXAM: CHEST - 2 VIEW COMPARISON:  December 07, 2017 FINDINGS: There is a small pleural effusion on each side. There is slight bibasilar atelectasis. There is no edema or consolidation. Heart is mildly enlarged with pulmonary vascularity normal. Pacemaker leads are attached to the right atrium and right ventricle. There is aortic atherosclerosis. No adenopathy. Bones are osteoporotic. IMPRESSION: Small bilateral pleural effusions with bibasilar atelectasis. No consolidation. Heart mildly enlarged with pulmonary vascularity  normal. Pacemaker leads attached to right atrium and right ventricle. There is aortic atherosclerosis. Bones osteoporotic. Aortic Atherosclerosis (ICD10-I70.0). Electronically Signed   By: Lowella Grip III M.D.   On: 12/23/2017 14:01    EKG: LBBB, rate 60; nonspecific ST changes with no evidence of acute ischemia   Labs on Admission: I have personally reviewed the available labs and imaging studies at the time of the admission.  Pertinent labs:  Na 137 K 3.9 Cl 103 CO2 24 Gluc 246 BUN 12 Creat 0.88 Alb 3.1 BNP 206 TnI <0.03 WBC 9.9 Hgb 11.8 Plt 437  U/A from 7/29 showed no protein   Assessment/Plan Principal Problem:   Fracture of femoral neck, left (HCC) Active Problems:   Dyslipidemia   Essential hypertension   CAD S/P percutaneous coronary angioplasty   Edema   Pacemaker   Permanent atrial fibrillation (HCC)   Chronic diastolic CHF (congestive heart failure) ([place])   Type II diabetes mellitus, well controlled ([place])   Hypothyroid  L hip femoral neck fracture -failed pinning, needs L THA, scheduled for tomorrow 8/15 -NPO after MN -holding Eliquis for procedure -norco prn mild-moderate pain, IV dilaudid prn severe pain -have asked RN to interrogate PPM  -BLE edema -she has known restrictive physiology, indicative of decreased LV diastolic compliance, and/or increased LA pressure, with a LVEF of 50-55% on her echo in Jan 2017. She has small pleural effusions and some mild cardiomegaly on CXR which were also apparent on CXR in Jan 2017. She appears well-compensated and I don't feel that her LE edema is related to acute CHF. She also had negative protein on her U/A 2 weeks ago so nephrotic syndrome very unlikely to have developed  in that time, and she has good renal function. LE edema is symmetric, and I doubt bilateral DVT. She has mild hypoalbuminemia (3.1) which, along with venous insufficiency and recent hip surgery, is likely the cause of her edema. Cont to  monitor, cont usual lasix dose. Consider compression stockings if edema continues, after discharge.  HTN -hold lasix tomorrow a.m., resume on POD #1 unless pt is hypovolemic/ hypotensive -cont ARB  AF on Eliquis -holding eliquis for surgery, resume when able -telemetry monitoring  HLD -cont zocor  DM2 -HbA1C was 7.1 12/07/2017 -carb-consistent diet -accuchecks with SSI  Hypothyroidism -cont levothyroxine  UTI, diagnosed prior to admission -cont cipro 250 mg BID, day 3  IDA -cont iron  DVT prophylaxis: Lovenox, start after surgery (had Eliquis today); SCDs Code Status: DNR - confirmed with patient/family Family Communication: Daughter [name] at bedside  Disposition Plan: SNF once clinically improved Consults called: Orthopedics  Admission status: Admit - It is my clinical opinion that admission to INPATIENT is reasonable and necessary because of the expectation that this patient will require hospital care that crosses at least 2 midnights to treat this condition based on the medical complexity of the problems presented.  Given the aforementioned information, the predictability of an adverse outcome is felt to be significant.    Janora Norlander MD [facility]  If note is complete, please contact covering daytime or nighttime physician. [email or web address] Password [email or web address]  12/23/2017, 3:20 PM

## 2017-12-23 NOTE — ED Provider Notes (Signed)
[facility] EMERGENCY DEPARTMENT Provider Note   CSN: [id number] Arrival date & time: 12/23/17  1056     History   Chief Complaint Chief Complaint  Patient presents with  . Hip Pain    HPI [name] is a 84 y.o. female.  Patient brought in by POV by her daughter.  Patient is here to get admitted for left hip surgery scheduled for tomorrow.  Patient will require medical clearance by hospitalist service.  At the end of July patient had a left hip fracture and had pinning however that has failed.  The patient is going to need a total hip replacement.  Dr. Mardelle Matte is the orthopedic surgeon.  Patient is on Eliquis for long-standing history of atrial fib.  Patient has a pacemaker for Mobitz type II AV block.  Patient is complaining of pain to the left hip area.  When patient first arrived there was a little confusing when she was here for because we had not been contacted directly.  But we were able to sort it out vertically with the help of the daughter.  The hospitalist service had been contacted about her pending admission.  Patient does have bilateral leg swelling.     Past Medical History:  Diagnosis Date  . Atrial fibrillation ([place])   . CAD (coronary artery disease)   . Colitis   . Colon cancer ([place])   . Diverticulosis of colon   . DJD (degenerative joint disease)   . DM (diabetes mellitus) ([place])   . Hip fracture ([place])   . HLD (hyperlipidemia)   . HTN (hypertension)   . Hypothyroidism   . Mobitz (type) II atrioventricular block   . Ventral hernia     Patient Active Problem List   Diagnosis Date Noted  . Hip fracture requiring operative repair ([place]) 12/23/2017  . UTI (urinary tract infection) 12/07/2017  . Fracture of femoral neck, left ([place])   . Mobitz type II atrioventricular block   . Chronic anticoagulation 05/24/2015  . Permanent atrial fibrillation ([place]) 05/14/2015  . Chronic diastolic CHF (congestive heart failure) ([place]) 05/14/2015  .  Acute respiratory failure ([place]) 05/14/2015  . Elevated troponin 05/14/2015  . Type II diabetes mellitus, well controlled ([place]) 05/14/2015  . Hypothyroid 05/14/2015  . Pacemaker 02/12/2012  . History of colon cancer 03/25/2011  . Edema 02/19/2011  . COLITIS 12/06/2008  . DIARRHEA 11/23/2008  . CAD S/P percutaneous coronary angioplasty 09/28/2008  . VENTRAL HERNIA 09/28/2008  . SYNCOPE 09/28/2008  . Dyslipidemia 08/10/2007  . Essential hypertension 08/10/2007  . INTERNAL HEMORRHOIDS 08/10/2007  . DEGENERATIVE JOINT DISEASE 08/10/2007  . DIVERTICULOSIS OF COLON 02/17/2007    Past Surgical History:  Procedure Laterality Date  . APPENDECTOMY    . CARDIAC CATHETERIZATION  08/22/1997  . CORONARY ANGIOPLASTY  07/03/2008  . HEMICOLECTOMY  10/07   right  . HIP PINNING,CANNULATED Left 12/07/2017   Procedure: CANNULATED HIP PINNING;  Surgeon: [name], MD;  Location: [facility];  Service: Orthopedics;  Laterality: Left;  . PACEMAKER INSERTION  07/06/08   MDT [id number] implanted for Mobitz II heart block by Dr Rayann Heman  . ROTATOR CUFF REPAIR    . TRANSTHORACIC ECHOCARDIOGRAM  06/29/2008   EF 55-60%     OB History   None      Home Medications    Prior to Admission medications   Medication Sig Start Date End Date Taking? Authorizing Provider  ciprofloxacin (CIPRO) 250 MG tablet Take 250 mg by mouth 2 (two) times  daily. FOR 7 DAYS 12/21/17 12/27/17 Yes [provider]  ELIQUIS 5 MG TABS tablet TAKE 1 TABLET BY MOUTH TWICE DAILY 10/27/17  Yes [place], Peter M, MD  escitalopram (LEXAPRO) 5 MG tablet Take 5 mg by mouth daily.   Yes [provider]  ferrous sulfate 325 (65 FE) MG tablet Take 1 tablet (325 mg total) by mouth 2 (two) times daily with a meal. 12/09/17  Yes Ghimire, Henreitta Leber, MD  furosemide (LASIX) 40 MG tablet Take 1 tablet (40 mg total) by mouth daily. 11/10/12  Yes [place], Peter M, MD  HYDROcodone-acetaminophen (NORCO/VICODIN) 5-325 MG tablet Take 1-2 tablets by mouth  every 6 (six) hours as needed for moderate pain (pain score 4-6). Patient taking differently: Take 1 tablet by mouth See admin instructions. Take 1 tablet by mouth three times a day and may also take 1 tablet three times a day as needed for moderate pain 12/09/17  Yes Ghimire, Henreitta Leber, MD  levothyroxine (SYNTHROID, LEVOTHROID) 88 MCG tablet Take 88 mcg by mouth daily. 11/30/17  Yes [provider]  losartan (COZAAR) 100 MG tablet Take 1 tablet (100 mg total) by mouth daily. 11/03/16  Yes [place], Peter M, MD  magnesium hydroxide (MILK OF MAGNESIA) 400 MG/5ML suspension Take 30 mLs by mouth daily as needed for mild constipation.   Yes [provider]  nitroGLYCERIN (NITROSTAT) 0.4 MG SL tablet Place 0.4 mg under the tongue every 5 (five) minutes x 3 doses as needed (for chest pain and NOTIFY MD, IF NO RELIEF).    Yes [provider]  polyethylene glycol (MIRALAX / GLYCOLAX) packet Take 17 g by mouth daily. 12/09/17  Yes Ghimire, Henreitta Leber, MD  potassium chloride SA (K-DUR,KLOR-CON) 20 MEQ tablet Take 1 tablet (20 mEq total) by mouth daily. 12/09/17  Yes Ghimire, Henreitta Leber, MD  senna-docusate (SENEXON-S) 8.6-50 MG tablet Take 1 tablet by mouth daily.   Yes [provider]  simvastatin (ZOCOR) 40 MG tablet Take 1 tablet (40 mg total) by mouth at bedtime. 11/10/12  Yes [place], Peter M, MD  Probiotic Product (ALIGN PO) Take 1 capsule by mouth daily.    02/19/11  [provider]    Family History Family History  Problem Relation Age of Onset  . Heart attack Father     Social History Social History   Tobacco Use  . Smoking status: Never Smoker  . Smokeless tobacco: Never Used  Substance Use Topics  . Alcohol use: No  . Drug use: No     Allergies   Diclofenac sodium; Duragesic-100 [fentanyl]; Hytrin [terazosin hcl]; Ibuprofen; Norvasc [amlodipine besylate]; and Plavix [clopidogrel bisulfate]   Review of Systems Review of Systems  Constitutional:  Negative for fever.  HENT: Negative for congestion.   Eyes: Negative for redness.  Respiratory: Negative for shortness of breath.   Cardiovascular: Positive for leg swelling.  Gastrointestinal: Negative for abdominal pain.  Genitourinary: Negative for dysuria.  Musculoskeletal: Negative for back pain.  Skin: Positive for wound.  Neurological: Negative for syncope and headaches.  Hematological: Bruises/bleeds easily.  Psychiatric/Behavioral: Negative for confusion.     Physical Exam Updated Vital Signs BP (!) 110/44   Pulse 60   Temp 97.8 F (36.6 C) (Oral)   Resp 16   SpO2 96%   Physical Exam  Constitutional: She appears well-developed and well-nourished. She appears distressed.  HENT:  Head: Normocephalic and atraumatic.  Mouth/Throat: Oropharynx is clear and moist.  Eyes: Pupils are equal, round, and reactive to light.  Conjunctivae and EOM are normal.  Neck: Neck supple.  Cardiovascular: Normal rate, regular rhythm and normal heart sounds.  Pulmonary/Chest: Effort normal and breath sounds normal. No respiratory distress. She has no wheezes. She has no rales.  Abdominal: Soft. Bowel sounds are normal. There is no tenderness.  Musculoskeletal: She exhibits edema.  Decreased range of motion of left leg.  Bilateral leg edema.  No significant erythema.  Patient's left hip wound has a bandage on it no surrounding erythema.  No evidence of any discharge or bleeding.  Distally he has good cap refill.  Neurological: She is alert. No cranial nerve deficit or sensory deficit. She exhibits normal muscle tone.  Skin: Skin is warm.  Nursing note and vitals reviewed.    ED Treatments / Results  Labs (all labs ordered are listed, but only abnormal results are displayed) Labs Reviewed  CBC WITH DIFFERENTIAL/PLATELET - Abnormal; Notable for the following components:      Result Value   RBC 3.77 (*)    Hemoglobin 11.8 (*)    HCT 35.9 (*)    Platelets 437 (*)    All other components  within normal limits  COMPREHENSIVE METABOLIC PANEL - Abnormal; Notable for the following components:   Glucose, Bld 246 (*)    Albumin 3.1 (*)    GFR calc non Af Amer 53 (*)    All other components within normal limits  BRAIN NATRIURETIC PEPTIDE - Abnormal; Notable for the following components:   B Natriuretic Peptide 206.0 (*)    All other components within normal limits  CBG MONITORING, ED - Abnormal; Notable for the following components:   Glucose-Capillary 149 (*)    All other components within normal limits  TROPONIN I  URINALYSIS, ROUTINE W REFLEX MICROSCOPIC  CBC  BASIC METABOLIC PANEL    EKG None   ED ECG REPORT   Date: 12/23/2017  Rate: 60  Rhythm: indeterminate  QRS Axis: left  Intervals: QT prolonged  ST/T Wave abnormalities: nonspecific T wave changes  Conduction Disutrbances:left bundle branch block  Narrative Interpretation:   Old EKG Reviewed: none available  I have personally reviewed the EKG tracing and agree with the computerized printout as noted.  Patient's EKG very well could the due to pacing.  Left bundle branch pattern junctional rhythm.    Radiology Dg Chest 2 View  Result Date: 12/23/2017 CLINICAL DATA:  Recent hip fracture.  Atrial fibrillation EXAM: CHEST - 2 VIEW COMPARISON:  December 07, 2017 FINDINGS: There is a small pleural effusion on each side. There is slight bibasilar atelectasis. There is no edema or consolidation. Heart is mildly enlarged with pulmonary vascularity normal. Pacemaker leads are attached to the right atrium and right ventricle. There is aortic atherosclerosis. No adenopathy. Bones are osteoporotic. IMPRESSION: Small bilateral pleural effusions with bibasilar atelectasis. No consolidation. Heart mildly enlarged with pulmonary vascularity normal. Pacemaker leads attached to right atrium and right ventricle. There is aortic atherosclerosis. Bones osteoporotic. Aortic Atherosclerosis (ICD10-I70.0). Electronically Signed   By:  Lowella Grip III M.D.   On: 12/23/2017 14:01    Procedures Procedures (including critical care time)  Medications Ordered in ED Medications  chlorhexidine (HIBICLENS) 4 % liquid 4 application (0 application Topical Hold 12/23/17 1323)  povidone-iodine 10 % swab 2 application (0 application Topical Hold 12/23/17 1323)  tranexamic acid (CYKLOKAPRON) 1,000 mg in sodium chloride 0.9 % 100 mL IVPB (0 mg Intravenous Hold 12/23/17 1504)  0.9 %  sodium chloride infusion ( Intravenous New Bag/Given 12/23/17 1338)  HYDROmorphone (DILAUDID) injection 0.5 mg (has no administration in time range)  ondansetron (ZOFRAN) injection 4 mg (has no administration in time range)  HYDROcodone-acetaminophen (NORCO/VICODIN) 5-325 MG per tablet 1-2 tablet (has no administration in time range)  furosemide (LASIX) tablet 40 mg (has no administration in time range)  losartan (COZAAR) tablet 100 mg (has no administration in time range)  nitroGLYCERIN (NITROSTAT) SL tablet 0.4 mg (has no administration in time range)  simvastatin (ZOCOR) tablet 40 mg (has no administration in time range)  levothyroxine (SYNTHROID, LEVOTHROID) tablet 88 mcg (has no administration in time range)  polyethylene glycol (MIRALAX / GLYCOLAX) packet 17 g (has no administration in time range)  ferrous sulfate tablet 325 mg (has no administration in time range)  potassium chloride SA (K-DUR,KLOR-CON) CR tablet 20 mEq (has no administration in time range)  enoxaparin (LOVENOX) injection 40 mg (has no administration in time range)  insulin aspart (novoLOG) injection 0-9 Units (has no administration in time range)  insulin aspart (novoLOG) injection 0-5 Units (has no administration in time range)  ciprofloxacin (CIPRO) tablet 250 mg (has no administration in time range)  ondansetron (ZOFRAN) injection 4 mg (4 mg Intravenous Given 12/23/17 1334)  HYDROmorphone (DILAUDID) injection 0.5 mg (0.5 mg Intravenous Given 12/23/17 1334)     Initial  Impression / Assessment and Plan / ED Course  I have reviewed the triage vital signs and the nursing notes.  Pertinent labs & imaging results that were available during my care of the patient were reviewed by me and considered in my medical decision making (see chart for details).     Patient's work-up for admission without significant findings other than a slight elevation in the BNP.  Chest x-ray did show a pleural effusion small no significant evidence of congestive heart failure.  Patient's BNP was 206.  Hemoglobin stable 11.6.  Troponin was negative.  EKG which would not cross over electronically showed a heart rate of 60 with a left bundle branch pattern.  Patient does have a pacemaker.  Discussed with hospitalist they will admit for further medical clearance for surgery and replacement of the left hip tomorrow.  Final Clinical Impressions(s) / ED Diagnoses   Final diagnoses:  Closed fracture of left hip, initial encounter [facility])    ED Discharge Orders    None       Fredia Sorrow, MD 12/23/17 1719

## 2017-12-23 NOTE — ED Notes (Signed)
Pt complaint of L heel pain. This RN removed patient's socks and noted a large blister to L heel. Elevated heel on bed.

## 2017-12-23 NOTE — Progress Notes (Signed)
Admitted to 6N32, alert and oriented X4, not in any distress, VSS. Oriented to unit and staff. Cardiac monitor started.

## 2017-12-23 NOTE — ED Triage Notes (Signed)
Pt states was sent here for non-healing left hip incision, states is going to need to take patient back to the OR.

## 2017-12-24 ENCOUNTER — Telehealth: Payer: Self-pay | Admitting: Cardiology

## 2017-12-24 ENCOUNTER — Inpatient Hospital Stay (HOSPITAL_COMMUNITY): Payer: PPO

## 2017-12-24 ENCOUNTER — Inpatient Hospital Stay (HOSPITAL_COMMUNITY): Payer: PPO | Admitting: Certified Registered"

## 2017-12-24 ENCOUNTER — Encounter: Payer: PPO | Admitting: *Deleted

## 2017-12-24 ENCOUNTER — Encounter (HOSPITAL_COMMUNITY)
Admission: EM | Disposition: A | Discharge status: Skilled Nursing Facility | Payer: Self-pay | Source: Home / Self Care | Attending: Internal Medicine

## 2017-12-24 ENCOUNTER — Encounter (HOSPITAL_COMMUNITY): Payer: Self-pay

## 2017-12-24 DIAGNOSIS — I5032 Chronic diastolic (congestive) heart failure: Secondary | ICD-10-CM

## 2017-12-24 DIAGNOSIS — E119 Type 2 diabetes mellitus without complications: Secondary | ICD-10-CM

## 2017-12-24 DIAGNOSIS — I10 Essential (primary) hypertension: Secondary | ICD-10-CM

## 2017-12-24 DIAGNOSIS — E785 Hyperlipidemia, unspecified: Secondary | ICD-10-CM

## 2017-12-24 DIAGNOSIS — E039 Hypothyroidism, unspecified: Secondary | ICD-10-CM

## 2017-12-24 DIAGNOSIS — I2510 Atherosclerotic heart disease of native coronary artery without angina pectoris: Secondary | ICD-10-CM

## 2017-12-24 DIAGNOSIS — I482 Chronic atrial fibrillation: Secondary | ICD-10-CM

## 2017-12-24 DIAGNOSIS — S72002A Fracture of unspecified part of neck of left femur, initial encounter for closed fracture: Secondary | ICD-10-CM

## 2017-12-24 DIAGNOSIS — Z9861 Coronary angioplasty status: Secondary | ICD-10-CM

## 2017-12-24 HISTORY — PX: HIP ARTHROPLASTY: SHX981

## 2017-12-24 LAB — URINALYSIS, ROUTINE W REFLEX MICROSCOPIC
Bilirubin Urine: NEGATIVE
Glucose, UA: 50 mg/dL — AB
Hgb urine dipstick: NEGATIVE
Ketones, ur: NEGATIVE mg/dL
Nitrite: NEGATIVE
Protein, ur: NEGATIVE mg/dL
Specific Gravity, Urine: 1.009 (ref 1.005–1.030)
pH: 7 (ref 5.0–8.0)

## 2017-12-24 LAB — CBC
HCT: 33.4 % — ABNORMAL LOW (ref 36.0–46.0)
Hemoglobin: 10.7 g/dL — ABNORMAL LOW (ref 12.0–15.0)
MCH: 31 pg (ref 26.0–34.0)
MCHC: 32 g/dL (ref 30.0–36.0)
MCV: 96.8 fL (ref 78.0–100.0)
Platelets: 425 10*3/uL — ABNORMAL HIGH (ref 150–400)
RBC: 3.45 MIL/uL — ABNORMAL LOW (ref 3.87–5.11)
RDW: 13.7 % (ref 11.5–15.5)
WBC: 8.1 10*3/uL (ref 4.0–10.5)

## 2017-12-24 LAB — BASIC METABOLIC PANEL
Anion gap: 7 (ref 5–15)
BUN: 11 mg/dL (ref 8–23)
CO2: 26 mmol/L (ref 22–32)
Calcium: 8.3 mg/dL — ABNORMAL LOW (ref 8.9–10.3)
Chloride: 104 mmol/L (ref 98–111)
Creatinine, Ser: 0.94 mg/dL (ref 0.44–1.00)
GFR calc Af Amer: 57 mL/min — ABNORMAL LOW (ref >60)
GFR calc non Af Amer: 49 mL/min — ABNORMAL LOW (ref >60)
Glucose, Bld: 144 mg/dL — ABNORMAL HIGH (ref 70–99)
Potassium: 3.6 mmol/L (ref 3.5–5.1)
Sodium: 137 mmol/L (ref 135–145)

## 2017-12-24 LAB — GLUCOSE, CAPILLARY
Glucose-Capillary: 128 mg/dL — ABNORMAL HIGH (ref 70–99)
Glucose-Capillary: 149 mg/dL — ABNORMAL HIGH (ref 70–99)
Glucose-Capillary: 168 mg/dL — ABNORMAL HIGH (ref 70–99)
Glucose-Capillary: 245 mg/dL — ABNORMAL HIGH (ref 70–99)

## 2017-12-24 SURGERY — HEMIARTHROPLASTY, HIP, DIRECT ANTERIOR APPROACH, FOR FRACTURE
Anesthesia: Choice | Site: Hip | Laterality: Left

## 2017-12-24 MED ORDER — ONDANSETRON HCL 4 MG/2ML IJ SOLN
INTRAMUSCULAR | Status: DC | PRN
  Administered 2017-12-24: 4 mg via INTRAVENOUS

## 2017-12-24 MED ORDER — PROPOFOL 10 MG/ML IV BOLUS
INTRAVENOUS | Status: AC
  Filled 2017-12-24: qty 20

## 2017-12-24 MED ORDER — LIDOCAINE 2% (20 MG/ML) 5 ML SYRINGE
INTRAMUSCULAR | Status: DC | PRN
  Administered 2017-12-24: 100 mg via INTRAVENOUS

## 2017-12-24 MED ORDER — LIDOCAINE 2% (20 MG/ML) 5 ML SYRINGE
INTRAMUSCULAR | Status: AC
  Filled 2017-12-24: qty 5

## 2017-12-24 MED ORDER — HYDROCODONE-ACETAMINOPHEN 7.5-325 MG PO TABS
1.0000 | ORAL_TABLET | ORAL | Status: DC | PRN
  Administered 2017-12-25: 2 via ORAL
  Administered 2017-12-25: 1 via ORAL
  Administered 2017-12-26: 2 via ORAL
  Filled 2017-12-24 (×3): qty 2
  Filled 2017-12-24: qty 1

## 2017-12-24 MED ORDER — CEFAZOLIN SODIUM-DEXTROSE 2-4 GM/100ML-% IV SOLN
INTRAVENOUS | Status: AC
  Filled 2017-12-24: qty 100

## 2017-12-24 MED ORDER — MAGNESIUM CITRATE PO SOLN: 1.0000 | Freq: Once | ORAL | Status: DC | PRN

## 2017-12-24 MED ORDER — ONDANSETRON HCL 4 MG/2ML IJ SOLN: 4.0000 mg | Freq: Once | INTRAMUSCULAR | Status: DC | PRN

## 2017-12-24 MED ORDER — FENTANYL CITRATE (PF) 250 MCG/5ML IJ SOLN
INTRAMUSCULAR | Status: AC
  Filled 2017-12-24: qty 5

## 2017-12-24 MED ORDER — EPHEDRINE SULFATE-NACL 50-0.9 MG/10ML-% IV SOSY
PREFILLED_SYRINGE | INTRAVENOUS | Status: DC | PRN
  Administered 2017-12-24 (×2): 10 mg via INTRAVENOUS

## 2017-12-24 MED ORDER — ALUM & MAG HYDROXIDE-SIMETH 200-200-20 MG/5ML PO SUSP: 30.0000 mL | ORAL | Status: DC | PRN

## 2017-12-24 MED ORDER — LACTATED RINGERS IV SOLN
INTRAVENOUS | Status: DC | PRN
  Administered 2017-12-24: 16:00:00 via INTRAVENOUS

## 2017-12-24 MED ORDER — STERILE WATER FOR IRRIGATION IR SOLN
Status: DC | PRN
  Administered 2017-12-24: 1000 mL

## 2017-12-24 MED ORDER — POLYETHYLENE GLYCOL 3350 17 G PO PACK: 17.0000 g | PACK | Freq: Every day | ORAL | Status: DC | PRN

## 2017-12-24 MED ORDER — PROPOFOL 10 MG/ML IV BOLUS
INTRAVENOUS | Status: DC | PRN
  Administered 2017-12-24: 30 mg via INTRAVENOUS
  Administered 2017-12-24: 80 mg via INTRAVENOUS

## 2017-12-24 MED ORDER — ROCURONIUM BROMIDE 10 MG/ML (PF) SYRINGE
PREFILLED_SYRINGE | INTRAVENOUS | Status: DC | PRN
  Administered 2017-12-24: 40 mg via INTRAVENOUS

## 2017-12-24 MED ORDER — METOCLOPRAMIDE HCL 5 MG PO TABS: 5.0000 mg | ORAL_TABLET | Freq: Three times a day (TID) | ORAL | Status: DC | PRN

## 2017-12-24 MED ORDER — HYDROMORPHONE HCL 1 MG/ML IJ SOLN
0.2500 mg | INTRAMUSCULAR | Status: DC | PRN
  Administered 2017-12-24 (×2): 0.5 mg via INTRAVENOUS

## 2017-12-24 MED ORDER — HYDROMORPHONE HCL 1 MG/ML IJ SOLN
INTRAMUSCULAR | Status: AC
  Filled 2017-12-24: qty 1

## 2017-12-24 MED ORDER — CIPROFLOXACIN HCL 250 MG PO TABS: 250.0000 mg | ORAL_TABLET | Freq: Two times a day (BID) | ORAL | Status: DC

## 2017-12-24 MED ORDER — ONDANSETRON HCL 4 MG PO TABS: 4.0000 mg | ORAL_TABLET | Freq: Four times a day (QID) | ORAL | Status: DC | PRN

## 2017-12-24 MED ORDER — DEXAMETHASONE SODIUM PHOSPHATE 10 MG/ML IJ SOLN
INTRAMUSCULAR | Status: DC | PRN
  Administered 2017-12-24: 4 mg via INTRAVENOUS

## 2017-12-24 MED ORDER — POTASSIUM CHLORIDE IN NACL 20-0.9 MEQ/L-% IV SOLN
INTRAVENOUS | Status: DC
  Administered 2017-12-24: 21:00:00 via INTRAVENOUS
  Filled 2017-12-24: qty 1000

## 2017-12-24 MED ORDER — MORPHINE SULFATE (PF) 2 MG/ML IV SOLN
0.5000 mg | INTRAVENOUS | Status: DC | PRN
  Administered 2017-12-25: 1 mg via INTRAVENOUS
  Administered 2017-12-25 – 2017-12-26 (×2): 0.5 mg via INTRAVENOUS
  Filled 2017-12-24 (×3): qty 1

## 2017-12-24 MED ORDER — MIDAZOLAM HCL 5 MG/5ML IJ SOLN
INTRAMUSCULAR | Status: DC | PRN
  Administered 2017-12-24: 1 mg via INTRAVENOUS

## 2017-12-24 MED ORDER — ACETAMINOPHEN 500 MG PO TABS
500.0000 mg | ORAL_TABLET | Freq: Four times a day (QID) | ORAL | Status: AC
  Administered 2017-12-24 – 2017-12-25 (×3): 500 mg via ORAL
  Filled 2017-12-24 (×3): qty 1

## 2017-12-24 MED ORDER — DEXAMETHASONE SODIUM PHOSPHATE 10 MG/ML IJ SOLN
INTRAMUSCULAR | Status: AC
  Filled 2017-12-24: qty 2

## 2017-12-24 MED ORDER — CEFAZOLIN SODIUM-DEXTROSE 2-4 GM/100ML-% IV SOLN
2.0000 g | INTRAVENOUS | Status: AC
  Administered 2017-12-24: 2 g via INTRAVENOUS

## 2017-12-24 MED ORDER — DOCUSATE SODIUM 100 MG PO CAPS
100.0000 mg | ORAL_CAPSULE | Freq: Two times a day (BID) | ORAL | Status: DC
  Administered 2017-12-24 – 2017-12-26 (×4): 100 mg via ORAL
  Filled 2017-12-24 (×4): qty 1

## 2017-12-24 MED ORDER — ACETAMINOPHEN 325 MG PO TABS: 325.0000 mg | ORAL_TABLET | Freq: Four times a day (QID) | ORAL | Status: DC | PRN

## 2017-12-24 MED ORDER — PHENOL 1.4 % MT LIQD: 1.0000 | OROMUCOSAL | Status: DC | PRN

## 2017-12-24 MED ORDER — ONDANSETRON HCL 4 MG/2ML IJ SOLN
INTRAMUSCULAR | Status: AC
  Filled 2017-12-24: qty 4

## 2017-12-24 MED ORDER — ESCITALOPRAM OXALATE 10 MG PO TABS
5.0000 mg | ORAL_TABLET | Freq: Every day | ORAL | Status: DC
  Administered 2017-12-24 – 2017-12-26 (×3): 5 mg via ORAL
  Filled 2017-12-24 (×3): qty 1

## 2017-12-24 MED ORDER — FENTANYL CITRATE (PF) 100 MCG/2ML IJ SOLN
INTRAMUSCULAR | Status: DC | PRN
  Administered 2017-12-24: 25 ug via INTRAVENOUS
  Administered 2017-12-24 (×2): 50 ug via INTRAVENOUS
  Administered 2017-12-24: 75 ug via INTRAVENOUS
  Administered 2017-12-24 (×3): 50 ug via INTRAVENOUS

## 2017-12-24 MED ORDER — ONDANSETRON HCL 4 MG/2ML IJ SOLN: 4.0000 mg | Freq: Four times a day (QID) | INTRAMUSCULAR | Status: DC | PRN

## 2017-12-24 MED ORDER — ACETAMINOPHEN 325 MG PO TABS: 650.0000 mg | ORAL_TABLET | Freq: Four times a day (QID) | ORAL | 1 refills | Status: AC | PRN

## 2017-12-24 MED ORDER — SUGAMMADEX SODIUM 200 MG/2ML IV SOLN
INTRAVENOUS | Status: DC | PRN
  Administered 2017-12-24: 150 mg via INTRAVENOUS

## 2017-12-24 MED ORDER — BISACODYL 10 MG RE SUPP: 10.0000 mg | Freq: Every day | RECTAL | Status: DC | PRN

## 2017-12-24 MED ORDER — FENTANYL CITRATE (PF) 250 MCG/5ML IJ SOLN
INTRAMUSCULAR | Status: AC
Start: 2017-12-24 — End: ?
  Filled 2017-12-24: qty 5

## 2017-12-24 MED ORDER — SODIUM CHLORIDE 0.9 % IR SOLN
Status: DC | PRN
  Administered 2017-12-24: 3000 mL

## 2017-12-24 MED ORDER — APIXABAN 5 MG PO TABS
5.0000 mg | ORAL_TABLET | Freq: Two times a day (BID) | ORAL | Status: DC
  Administered 2017-12-25 – 2017-12-26 (×3): 5 mg via ORAL
  Filled 2017-12-24 (×3): qty 1

## 2017-12-24 MED ORDER — CEFAZOLIN SODIUM-DEXTROSE 2-4 GM/100ML-% IV SOLN
2.0000 g | Freq: Four times a day (QID) | INTRAVENOUS | Status: AC
  Administered 2017-12-24 – 2017-12-25 (×2): 2 g via INTRAVENOUS
  Filled 2017-12-24 (×2): qty 100

## 2017-12-24 MED ORDER — METOCLOPRAMIDE HCL 5 MG/ML IJ SOLN: 5.0000 mg | Freq: Three times a day (TID) | INTRAMUSCULAR | Status: DC | PRN

## 2017-12-24 MED ORDER — MIDAZOLAM HCL 2 MG/2ML IJ SOLN
INTRAMUSCULAR | Status: AC
  Filled 2017-12-24: qty 2

## 2017-12-24 MED ORDER — FERROUS SULFATE 325 (65 FE) MG PO TABS
325.0000 mg | ORAL_TABLET | Freq: Three times a day (TID) | ORAL | Status: DC
  Administered 2017-12-25 – 2017-12-26 (×5): 325 mg via ORAL
  Filled 2017-12-24 (×5): qty 1

## 2017-12-24 MED ORDER — ROCURONIUM BROMIDE 50 MG/5ML IV SOSY
PREFILLED_SYRINGE | INTRAVENOUS | Status: AC
  Filled 2017-12-24: qty 5

## 2017-12-24 MED ORDER — EPHEDRINE 5 MG/ML INJ
INTRAVENOUS | Status: AC
  Filled 2017-12-24: qty 10

## 2017-12-24 MED ORDER — HYDROCODONE-ACETAMINOPHEN 5-325 MG PO TABS
1.0000 | ORAL_TABLET | ORAL | Status: DC | PRN
  Administered 2017-12-25: 2 via ORAL
  Administered 2017-12-25: 1 via ORAL
  Filled 2017-12-24: qty 1
  Filled 2017-12-24: qty 2

## 2017-12-24 MED ORDER — CEFDINIR 300 MG PO CAPS
300.0000 mg | ORAL_CAPSULE | Freq: Two times a day (BID) | ORAL | Status: DC
  Administered 2017-12-24 – 2017-12-26 (×4): 300 mg via ORAL
  Filled 2017-12-24 (×5): qty 1

## 2017-12-24 MED ORDER — MENTHOL 3 MG MT LOZG: 1.0000 | LOZENGE | OROMUCOSAL | Status: DC | PRN

## 2017-12-24 SURGICAL SUPPLY — 67 items
BLADE SAW SGTL 18.5X63.X.64 HD (BLADE) ×3 IMPLANT
BRUSH FEMORAL CANAL (MISCELLANEOUS) IMPLANT
CATH COUDE FOLEY 5CC 14FR (CATHETERS) ×2 IMPLANT
CEMENT BONE DEPUY (Cement) ×4 IMPLANT
CEMENT RESTRICTOR DEPUY SZ 4 (Cement) ×2 IMPLANT
CENTRALIZER STEM HIP 12MM (Hips) ×2 IMPLANT
CLOSURE STERI-STRIP 1/2X4 (GAUZE/BANDAGES/DRESSINGS) ×1
CLSR STERI-STRIP ANTIMIC 1/2X4 (GAUZE/BANDAGES/DRESSINGS) ×2 IMPLANT
COVER BACK TABLE 24X17X13 BIG (DRAPES) IMPLANT
COVER SURGICAL LIGHT HANDLE (MISCELLANEOUS) ×3 IMPLANT
DRAPE IMP U-DRAPE 54X76 (DRAPES) ×3 IMPLANT
DRAPE INCISE IOBAN 66X45 STRL (DRAPES) ×2 IMPLANT
DRAPE ORTHO SPLIT 77X108 STRL (DRAPES) ×6
DRAPE SURG ORHT 6 SPLT 77X108 (DRAPES) ×2 IMPLANT
DRAPE U-SHAPE 47X51 STRL (DRAPES) ×3 IMPLANT
DRESSING ALLEVYN LIFE SACRUM (GAUZE/BANDAGES/DRESSINGS) ×2 IMPLANT
DRILL BIT 5/64 (BIT) ×3 IMPLANT
DRSG MEPILEX BORDER 4X12 (GAUZE/BANDAGES/DRESSINGS) IMPLANT
DRSG MEPILEX BORDER 4X8 (GAUZE/BANDAGES/DRESSINGS) ×2 IMPLANT
DURAPREP 26ML APPLICATOR (WOUND CARE) ×5 IMPLANT
ELECT BLADE 6.5 EXT (BLADE) ×2 IMPLANT
ELECT CAUTERY BLADE 6.4 (BLADE) ×1 IMPLANT
ELECT REM PT RETURN 9FT ADLT (ELECTROSURGICAL) ×3
ELECTRODE REM PT RTRN 9FT ADLT (ELECTROSURGICAL) ×1 IMPLANT
GLOVE BIOGEL PI ORTHO PRO SZ8 (GLOVE) ×4
GLOVE ORTHO TXT STRL SZ7.5 (GLOVE) ×3 IMPLANT
GLOVE PI ORTHO PRO STRL SZ8 (GLOVE) ×1 IMPLANT
GLOVE SURG ORTHO 8.0 STRL STRW (GLOVE) ×6 IMPLANT
GOWN STRL REUS W/ TWL LRG LVL3 (GOWN DISPOSABLE) IMPLANT
GOWN STRL REUS W/ TWL XL LVL3 (GOWN DISPOSABLE) ×1 IMPLANT
GOWN STRL REUS W/TWL 2XL LVL3 (GOWN DISPOSABLE) ×3 IMPLANT
GOWN STRL REUS W/TWL LRG LVL3 (GOWN DISPOSABLE) ×9
GOWN STRL REUS W/TWL XL LVL3 (GOWN DISPOSABLE) ×3
HANDPIECE INTERPULSE COAX TIP (DISPOSABLE) ×3
HEAD FEM UNIPOLAR 48 OD (Hips) ×2 IMPLANT
KIT BASIN OR (CUSTOM PROCEDURE TRAY) ×3 IMPLANT
KIT TURNOVER KIT B (KITS) ×3 IMPLANT
MANIFOLD NEPTUNE II (INSTRUMENTS) ×3 IMPLANT
NEEDLE 22X1 1/2 (OR ONLY) (NEEDLE) ×1 IMPLANT
NS IRRIG 1000ML POUR BTL (IV SOLUTION) ×3 IMPLANT
PACK TOTAL JOINT (CUSTOM PROCEDURE TRAY) ×3 IMPLANT
PACK UNIVERSAL I (CUSTOM PROCEDURE TRAY) ×3 IMPLANT
PAD ARMBOARD 7.5X6 YLW CONV (MISCELLANEOUS) ×6 IMPLANT
PILLOW ABDUCTION HIP (SOFTGOODS) ×1 IMPLANT
PRESSURIZER FEMORAL UNIV (MISCELLANEOUS) IMPLANT
RETRIEVER SUT HEWSON (MISCELLANEOUS) ×3 IMPLANT
SET HNDPC FAN SPRY TIP SCT (DISPOSABLE) IMPLANT
SPACER DEPUY (Hips) ×2 IMPLANT
STEM SUMMIT CEMENT BASIC SZ4 (Hips) ×2 IMPLANT
SUCTION FRAZIER TIP 8 FR DISP (SUCTIONS) ×2
SUCTION TUBE FRAZIER 8FR DISP (SUCTIONS) IMPLANT
SUT FIBERWIRE #2 38 REV NDL BL (SUTURE) ×3
SUT FIBERWIRE #2 38 T-5 BLUE (SUTURE) ×3
SUT VIC AB 0 CT1 27 (SUTURE) ×3
SUT VIC AB 0 CT1 27XBRD ANBCTR (SUTURE) ×1 IMPLANT
SUT VIC AB 1 CT1 27 (SUTURE) ×6
SUT VIC AB 1 CT1 27XBRD ANBCTR (SUTURE) ×2 IMPLANT
SUT VIC AB 3-0 SH 8-18 (SUTURE) ×3 IMPLANT
SUTURE FIBERWR #2 38 T-5 BLUE (SUTURE) IMPLANT
SUTURE FIBERWR#2 38 REV NDL BL (SUTURE) ×2 IMPLANT
SYR CONTROL 10ML LL (SYRINGE) ×1 IMPLANT
TOWEL OR 17X24 6PK STRL BLUE (TOWEL DISPOSABLE) ×1 IMPLANT
TOWEL OR 17X26 10 PK STRL BLUE (TOWEL DISPOSABLE) ×3 IMPLANT
TOWER CARTRIDGE SMART MIX (DISPOSABLE) ×2 IMPLANT
TRAY FOLEY MTR SLVR 14FR STAT (SET/KITS/TRAYS/PACK) ×4 IMPLANT
TRAY FOLEY MTR SLVR 16FR STAT (SET/KITS/TRAYS/PACK) ×3 IMPLANT
WATER STERILE IRR 1000ML POUR (IV SOLUTION) ×6 IMPLANT

## 2017-12-24 NOTE — Progress Notes (Signed)
The risks benefits and alternatives were discussed with the patient including but not limited to the risks of nonoperative treatment, versus surgical intervention including infection, bleeding, nerve injury, periprosthetic fracture, the need for revision surgery, dislocation, leg length discrepancy, blood clots, cardiopulmonary complications, morbidity, mortality, among others, and they were willing to proceed.    The patient has been re-examined, and the chart reviewed, and there have been no interval changes to the documented history and physical.    The risks, benefits, and alternatives have been discussed at length, and the patient is willing to proceed.    [name] Gibler P Nadene Witherspoon, MD  

## 2017-12-24 NOTE — Telephone Encounter (Signed)
LMOVM reminding pt to send remote transmission.   

## 2017-12-24 NOTE — Progress Notes (Signed)
Patient back from PACU. Vitals taken and family by bedside

## 2017-12-24 NOTE — Progress Notes (Addendum)
OT Cancellation Note  Patient Details Name: [name] MRN: [id number] DOB: May 01, 1917   Cancelled Treatment:    Reason Eval/Treat Not Completed: Patient not medically ready(scheduled for THA today ).  Will plan to evaluate after surgery .     Delight Stare, OTR/L  Pager [place] 12/24/2017, 1:29 PM

## 2017-12-24 NOTE — Progress Notes (Signed)
PT Cancellation Note  Patient Details Name: [name] MRN: [id number] DOB: [birth year]   Cancelled Treatment:    Reason Eval/Treat Not Completed: Other (comment)(THA scheduled for today, will evaluate after surgery. )   [name] 12/24/2017, 8:08 AM [phone]

## 2017-12-24 NOTE — Op Note (Signed)
12/23/2017 - 12/24/2017  4:51 PM  PATIENT:  [name]   MRN: [id number]  PRE-OPERATIVE DIAGNOSIS: Displaced left femoral neck fracture with failed internal fixation  POST-OPERATIVE DIAGNOSIS: Same  PROCEDURE:  Procedure(s): Removal of internal fixation of the femoral neck fracture with conversion to left hip hemiarthroplasty.    PREOPERATIVE INDICATIONS:  [name] is an 84 y.o. female who was admitted 12/23/2017 with a diagnosis of Fracture of femoral neck, left ([place]) and elected for surgical management.  She initially had a percutaneous pinning 2 weeks ago and presented to my office for followup with increasing pain, with substantial displacement of her fracture, requiring conversion to hemiarthroplasty.  The risks benefits and alternatives were discussed with the patient and family including but not limited to the risks of nonoperative treatment, versus surgical intervention including infection, bleeding, nerve injury, periprosthetic fracture, the need for revision surgery, dislocation, leg length discrepancy, blood clots, cardiopulmonary complications, morbidity, mortality, among others, and they were willing to proceed.  Predicted outcome is good, although there will be at least a six to nine month expected recovery.   OPERATIVE REPORT     SURGEON:  [name], MD    ASSISTANT:  Joya Gaskins, OPA-C  (Present throughout the entire procedure,  necessary for completion of procedure in a timely manner, assisting with retraction, instrumentation, and closure)     ANESTHESIA:  general  ESTIMATED BLOOD LOSS: 782 ml    COMPLICATIONS:  None.   UNIQUE ASPECTS OF THE CASE: The screws were fairly easy to remove.  The fracture was unstable.  The bone quality was poor.  I was concerned about a possible calcar crack, or at least I did not have good fixation with the press-fit technique, and did not want to go up to a size 5, so I cemented the stem in place.  My access posteriorly was  challenging because I believe my incision through the fascia was a little bit anterior, and I had to release some of the IT band in a T-type fashion in order to gain access posteriorly.    COMPONENTS:  Depuy [place] Basic cemented fracture stem size 4, with a -3 spacer and a size 46 fracture head unipolar hip ball.    PROCEDURE IN DETAIL: The patient was met in the holding area and identified.  The appropriate hip  was marked at the operative site. The patient was then transported to the OR and  placed under anesthesia.  At that point, the patient was  placed in the lateral decubitus position with the operative side up and  secured to the operating room table and all bony prominences padded.     The operative lower extremity was prepped from the iliac crest to the toes.  Sterile draping was performed.  Time out was performed prior to incision.      A routine posterolateral approach was utilized via sharp dissection  carried down to the subcutaneous tissue.  Gross bleeders were Bovie  coagulated.  The iliotibial band was identified and incised  along the length of the skin incision.  Self-retaining retractors were  inserted.  With the hip internally rotated, the short external rotators  were identified. The piriformis was tagged with FiberWire, and the hip capsule released in a T-type fashion.  The femoral neck was exposed, and I resected the femoral neck using the appropriate jig. This was performed at approximately a thumb's breadth above the lesser trochanter.    I then exposed the deep acetabulum, cleared out  any tissue including the ligamentum teres, and included the hip capsule in the FiberWire used above and below the T.    I then prepared the proximal femur using the cookie-cutter, the lateralizing reamer, and then sequentially broached.  A trial was utilized, initially I was unable to reduce the hip.  I countersunk the broach, and then used the reamer.  The 4 initially appeared to be stable,  but then ultimately was rotationally unstable, and therefore I was able to trial, but felt that it was better to cement the stem in place.    And I reduced the hip and it was found to have excellent stability with functional range of motion. The trial components were then removed.   I then place the real implant and I impacted the real head ball into place.  A canal restrictor was utilized, along with pressurization of the cement, and I placed the prosthesis and had appropriate stability of the implant.  The hip was then reduced and taken through functional range of motion and found to have excellent stability. Leg lengths were restored.  I then used a 2 mm drill bits to pass the FiberWire suture from the capsule and piriformis through the greater trochanter, and secured this. Excellent posterior capsular repair was achieved. I also closed the T in the capsule.  I then irrigated the hip copiously again with pulse lavage, and repaired the fascia with Vicryl, followed by Vicryl for the subcutaneous tissue, Monocryl for the skin, Steri-Strips and sterile gauze. The wounds were injected. The patient was then awakened and returned to PACU in stable and satisfactory condition. There were no complications.  [name], MD Orthopedic Surgeon [phone]   12/24/2017 4:51 PM

## 2017-12-24 NOTE — Anesthesia Procedure Notes (Signed)
Procedure Name: Intubation Date/Time: 12/24/2017 2:12 PM Performed by: Moshe Salisbury, CRNA Pre-anesthesia Checklist: Patient identified, Emergency Drugs available, Suction available and Patient being monitored Patient Re-evaluated:Patient Re-evaluated prior to induction Oxygen Delivery Method: Circle System Utilized Preoxygenation: Pre-oxygenation with 100% oxygen Induction Type: IV induction Ventilation: Mask ventilation without difficulty Laryngoscope Size: Mac and 3 Grade View: Grade I Tube type: Oral Tube size: 7.5 mm Number of attempts: 1 Airway Equipment and Method: Stylet Placement Confirmation: ETT inserted through vocal cords under direct vision,  positive ETCO2 and breath sounds checked- equal and bilateral Secured at: 20 cm Tube secured with: Tape Dental Injury: Teeth and Oropharynx as per pre-operative assessment

## 2017-12-24 NOTE — Progress Notes (Signed)
PROGRESS NOTE    [name]  RKY:[id number] DOB: 12/28/1916 DOA: 12/23/2017 PCP: Leeroy Cha, MD (Confirm with patient/family/NH records and if not entered, this HAS to be entered at [facility] point of entry. "No PCP" if truly none.)   Brief Narrative: (Start on day 1 of progress note - keep it brief and live) 84 year old female with history of hypothyroidism, hypertension, diabetes mellitus, colon cancer, CAD S/B PTCA, history of Mobitz type II S/P pacemaker placement, permanent atrial fibrillation on Eliquis had nondisplaced left hip fracture about 2 weeks back underwent cannulated hip pinning on 12/07/2017.  Since then patient continued to have worsening of the pain in the left hip, progressive dysfunction of the limp.  Patient was also noted to have increasing bilateral lower extremity edema concerning this patient was brought back to the emergency department.  Consulted orthopedic surgery plan to do open reduction and internal fixation of the left hip.  Per family patient is well functional at baseline walks around the house.   Assessment & Plan:   Principal Problem:   Fracture of femoral neck, left (HCC) Active Problems:   Dyslipidemia   Essential hypertension   CAD S/P percutaneous coronary angioplasty   Edema   Pacemaker   Permanent atrial fibrillation (HCC)   Chronic diastolic CHF (congestive heart failure) (HCC)   Type II diabetes mellitus, well controlled ([place])   Hypothyroid   Hip fracture requiring operative repair ([place])   ##Left femoral neck fracture -Failure pending -Plan for open reduction and internal fixation today -Continue with pain management as needed  ##Bilateral lower extremity edema -Patient has preserved left ventricular function with EF of 50 to 55% -Concern about venous stasis edema -Continue to follow up with elevating lower extremities  ##Hypertension -Currently well controlled   ##Atrial fibrillation permanent -Currently rate is  controlled -Hold Eliquis for the surgery  ##Diabetes mellitus -Hemoglobin A1c was 7.1 on 12/07/2088 -Continue on sliding scale insulin  ##Hypothyroidism -Continue with Synthroid  ##UTI - Keep on cefdinir for total of 7 days     DVT prophylaxis: (Lovenox/Heparin/SCD's/anticoagulated/None (if comfort care) Code Status: (Full/Partial - specify details) Family Communication: (Specify name, relationship & date discussed. NO "discussed with patient") Disposition Plan: (specify when and where you expect patient to be discharged). Include barriers to DC in this tab.   Consultants:   Orthopedic surgery  Procedures:  ORIF of left femoral neck fracture on 12/24/2017   Antimicrobials: Cefdinir-8/15 -   Subjective: Denied having any complaints this morning.  Patient states it does not feel pain when she does not move  Objective: Vitals:   12/23/17 1820 12/23/17 2012 12/23/17 2334 12/24/17 0440  BP: (!) 159/70 (!) 152/57 123/86 (!) 125/51  Pulse: 67 60 60 61  Resp: 16 18 18 18   Temp: 98 F (36.7 C) 98 F (36.7 C) 97.9 F (36.6 C) 97.6 F (36.4 C)  TempSrc: Oral Oral Oral Oral  SpO2: 94% 100% 97% 99%  Weight:      Height:        Intake/Output Summary (Last 24 hours) at 12/24/2017 1602 Last data filed at 12/24/2017 1549 Gross per 24 hour  Intake 2557.26 ml  Output 3450 ml  Net -892.74 ml   Filed Weights   12/23/17 1700  Weight: 77.6 kg    Examination:  General exam: Appears calm and comfortable  Respiratory system: Clear to auscultation. Respiratory effort normal. Cardiovascular system: S1 & S2 heard, RRR. No JVD, murmurs, rubs, gallops or clicks. No pedal edema. Gastrointestinal system: Abdomen  is nondistended, soft and nontender. No organomegaly or masses felt. Normal bowel sounds heard. Central nervous system: Alert and oriented. No focal neurological deficits. Extremities: Left lower extremity tenderness to palpation, range of motion  skin: No rashes, lesions  or ulcers Psychiatry: Judgement and insight appear normal. Mood & affect appropriate.     Data Reviewed: I have personally reviewed following labs and imaging studies  CBC: Recent Labs  Lab 12/23/17 1332 12/24/17 0440  WBC 9.9 8.1  NEUTROABS 7.0  --   HGB 11.8* 10.7*  HCT 35.9* 33.4*  MCV 95.2 96.8  PLT 437* 357*   Basic Metabolic Panel: Recent Labs  Lab 12/23/17 1332 12/24/17 0440  NA 137 137  K 3.9 3.6  CL 103 104  CO2 24 26  GLUCOSE 246* 144*  BUN 12 11  CREATININE 0.88 0.94  CALCIUM 9.0 8.3*   GFR: Estimated Creatinine Clearance: 33 mL/min (by C-G formula based on SCr of 0.94 mg/dL). Liver Function Tests: Recent Labs  Lab 12/23/17 1332  AST 20  ALT 14  ALKPHOS 117  BILITOT 1.0  PROT 7.1  ALBUMIN 3.1*   No results for input(s): LIPASE, AMYLASE in the last 168 hours. No results for input(s): AMMONIA in the last 168 hours. Coagulation Profile: No results for input(s): INR, PROTIME in the last 168 hours. Cardiac Enzymes: Recent Labs  Lab 12/23/17 1332  TROPONINI <0.03   BNP (last 3 results) No results for input(s): PROBNP in the last 8760 hours. HbA1C: No results for input(s): HGBA1C in the last 72 hours. CBG: Recent Labs  Lab 12/23/17 1657 12/23/17 2209 12/24/17 0820 12/24/17 1255  GLUCAP 149* 224* 128* 149*   Lipid Profile: No results for input(s): CHOL, HDL, LDLCALC, TRIG, CHOLHDL, LDLDIRECT in the last 72 hours. Thyroid Function Tests: No results for input(s): TSH, T4TOTAL, FREET4, T3FREE, THYROIDAB in the last 72 hours. Anemia Panel: No results for input(s): VITAMINB12, FOLATE, FERRITIN, TIBC, IRON, RETICCTPCT in the last 72 hours. Sepsis Labs: No results for input(s): PROCALCITON, LATICACIDVEN in the last 168 hours.  Recent Results (from the past 240 hour(s))  Surgical pcr screen     Status: None   Collection Time: 12/23/17  8:08 PM  Result Value Ref Range Status   MRSA, PCR NEGATIVE NEGATIVE Final   Staphylococcus aureus  NEGATIVE NEGATIVE Final    Comment: (NOTE) The Xpert SA Assay (FDA approved for NASAL specimens in patients 56 years of age and older), is one component of a comprehensive surveillance program. It is not intended to diagnose infection nor to guide or monitor treatment. Performed at [facility], [place] [address], [place], [place] [address]          Radiology Studies: Dg Chest 2 View  Result Date: 12/23/2017 CLINICAL DATA:  Recent hip fracture.  Atrial fibrillation EXAM: CHEST - 2 VIEW COMPARISON:  December 07, 2017 FINDINGS: There is a small pleural effusion on each side. There is slight bibasilar atelectasis. There is no edema or consolidation. Heart is mildly enlarged with pulmonary vascularity normal. Pacemaker leads are attached to the right atrium and right ventricle. There is aortic atherosclerosis. No adenopathy. Bones are osteoporotic. IMPRESSION: Small bilateral pleural effusions with bibasilar atelectasis. No consolidation. Heart mildly enlarged with pulmonary vascularity normal. Pacemaker leads attached to right atrium and right ventricle. There is aortic atherosclerosis. Bones osteoporotic. Aortic Atherosclerosis (ICD10-I70.0). Electronically Signed   By: Lowella Grip III M.D.   On: 12/23/2017 14:01        Scheduled Meds: . [  MAR Hold] cefdinir  300 mg Oral Q12H  . [MAR Hold] chlorhexidine  60 mL Topical Once  . [MAR Hold] ferrous sulfate  325 mg Oral Q12H  . [MAR Hold] furosemide  40 mg Oral Daily  . [MAR Hold] insulin aspart  0-5 Units Subcutaneous QHS  . [MAR Hold] insulin aspart  0-9 Units Subcutaneous TID WC  . [MAR Hold] levothyroxine  88 mcg Oral QAC breakfast  . [MAR Hold] losartan  100 mg Oral Daily  . [MAR Hold] polyethylene glycol  17 g Oral Daily  . [MAR Hold] potassium chloride SA  20 mEq Oral Daily  . [MAR Hold] povidone-iodine  2 application Topical Once  . [MAR Hold] simvastatin  40 mg Oral QHS   Continuous Infusions: . sodium chloride Stopped  (12/24/17 1545)     LOS: 1 day    Time spent: 67 minutes    Jansen Sciuto, MD [facility] Pager [phone]  If 7PM-7AM, please contact night-coverage [email or web address] Password [facility] 12/24/2017, 4:02 PM

## 2017-12-24 NOTE — Progress Notes (Signed)
Report called to Sam in short stay.

## 2017-12-24 NOTE — Social Work (Signed)
CSW acknowledging pt admit to 6N- pt recently discharged on 7/31 to [place]. Will follow for post operative recommendations.   Alexander Mt, [place] Work [phone]

## 2017-12-24 NOTE — Transfer of Care (Signed)
Immediate Anesthesia Transfer of Care Note  Patient: [name]  Procedure(s) Performed: LEFT ARTHROPLASTY BIPOLAR HIP (HEMIARTHROPLASTY) (Left Hip)  Patient Location: PACU  Anesthesia Type:General  Level of Consciousness: awake and alert   Airway & Oxygen Therapy: Patient Spontanous Breathing and Patient connected to nasal cannula oxygen  Post-op Assessment: Report given to RN and Post -op Vital signs reviewed and stable  Post vital signs: Reviewed and stable  Last Vitals:  Vitals Value Taken Time  BP 139/108 12/24/2017  5:32 PM  Temp    Pulse 61 12/24/2017  5:35 PM  Resp 13 12/24/2017  5:35 PM  SpO2 99 % 12/24/2017  5:35 PM  Vitals shown include unvalidated device data.  Last Pain:  Vitals:   12/24/17 0800  TempSrc:   PainSc: 0-No pain         Complications: No apparent anesthesia complications

## 2017-12-24 NOTE — Anesthesia Preprocedure Evaluation (Signed)
Anesthesia Evaluation  Patient identified by MRN, date of birth, ID band Patient awake    Reviewed: Allergy & Precautions, NPO status , Patient's Chart, lab work & pertinent test results  Airway Mallampati: I  TM Distance: >3 FB Neck ROM: Full    Dental   Pulmonary    Pulmonary exam normal        Cardiovascular hypertension, Pt. on medications + CAD  Normal cardiovascular exam+ pacemaker      Neuro/Psych    GI/Hepatic   Endo/Other  diabetes  Renal/GU      Musculoskeletal   Abdominal   Peds  Hematology   Anesthesia Other Findings   Reproductive/Obstetrics                             Anesthesia Physical Anesthesia Plan  ASA: III  Anesthesia Plan:    Post-op Pain Management:    Induction:   PONV Risk Score and Plan:   Airway Management Planned:   Additional Equipment:   Intra-op Plan:   Post-operative Plan:   Informed Consent:   Plan Discussed with:   Anesthesia Plan Comments:         Anesthesia Quick Evaluation

## 2017-12-24 NOTE — Progress Notes (Signed)
Tele is back on patient and central tele is aware

## 2017-12-24 NOTE — Progress Notes (Signed)
CBG completed in PACU

## 2017-12-25 ENCOUNTER — Encounter (HOSPITAL_COMMUNITY): Payer: Self-pay | Admitting: Orthopedic Surgery

## 2017-12-25 LAB — CBC
HCT: 29 % — ABNORMAL LOW (ref 36.0–46.0)
HEMATOCRIT: 30.5 % — AB (ref 36.0–46.0)
HEMOGLOBIN: 9.3 g/dL — AB (ref 12.0–15.0)
HEMOGLOBIN: 10 g/dL — AB (ref 12.0–15.0)
MCH: 31 pg (ref 26.0–34.0)
MCH: 31.6 pg (ref 26.0–34.0)
MCHC: 32.1 g/dL (ref 30.0–36.0)
MCHC: 32.8 g/dL (ref 30.0–36.0)
MCV: 96.7 fL (ref 78.0–100.0)
MCV: 96.5 fL (ref 78.0–100.0)
Platelets: 385 10*3/uL (ref 150–400)
Platelets: 405 10*3/uL — ABNORMAL HIGH (ref 150–400)
RBC: 3 MIL/uL — ABNORMAL LOW (ref 3.87–5.11)
RBC: 3.16 MIL/uL — AB (ref 3.87–5.11)
RDW: 13.8 % (ref 11.5–15.5)
RDW: 13.6 % (ref 11.5–15.5)
WBC: 9.7 10*3/uL (ref 4.0–10.5)
WBC: 12.8 10*3/uL — ABNORMAL HIGH (ref 4.0–10.5)

## 2017-12-25 LAB — BASIC METABOLIC PANEL
Anion gap: 8 (ref 5–15)
BUN: 16 mg/dL (ref 8–23)
CHLORIDE: 101 mmol/L (ref 98–111)
CO2: 23 mmol/L (ref 22–32)
CREATININE: 1.12 mg/dL — AB (ref 0.44–1.00)
Calcium: 7.8 mg/dL — ABNORMAL LOW (ref 8.9–10.3)
GFR calc Af Amer: 46 mL/min — ABNORMAL LOW (ref >60)
GFR calc non Af Amer: 40 mL/min — ABNORMAL LOW (ref >60)
GLUCOSE: 450 mg/dL — AB (ref 70–99)
Potassium: 4.3 mmol/L (ref 3.5–5.1)
SODIUM: 132 mmol/L — AB (ref 135–145)

## 2017-12-25 LAB — GLUCOSE, CAPILLARY
Glucose-Capillary: 372 mg/dL — ABNORMAL HIGH (ref 70–99)
Glucose-Capillary: 254 mg/dL — ABNORMAL HIGH (ref 70–99)
Glucose-Capillary: 163 mg/dL — ABNORMAL HIGH (ref 70–99)
Glucose-Capillary: 258 mg/dL — ABNORMAL HIGH (ref 70–99)

## 2017-12-25 NOTE — Progress Notes (Addendum)
Patient ID: [name], female   DOB: 06/09/1916, 84 y.o.   MRN: [id number]     Subjective:  Patient reports pain as mild.  Patient in bed and crying she does not report why she is crying but states that she does not have any hip pain at this time  Objective:   VITALS:   Vitals:   12/24/17 1802 12/24/17 2132 12/25/17 0248 12/25/17 0638  BP: (!) 141/35 (!) 144/83 (!) 103/44 (!) 150/64  Pulse: 60 (!) 59 (!) 58 60  Resp: (!) 23 17 17 16   Temp:  (!) 97.5 F (36.4 C) 98.6 F (37 C) 98.2 F (36.8 C)  TempSrc:  Oral Oral Oral  SpO2: 100% 99% 95% 98%  Weight:      Height:        ABD soft Sensation intact distally Dorsiflexion/Plantar flexion intact Incision: dressing C/D/I and no drainage Patient confused but is aware that she is in the hospital  Lab Results  Component Value Date   WBC 9.7 12/25/2017   HGB 9.3 (L) 12/25/2017   HCT 29.0 (L) 12/25/2017   MCV 96.7 12/25/2017   PLT 385 12/25/2017   BMET    Component Value Date/Time   NA 132 (L) 12/25/2017 0404   K 4.3 12/25/2017 0404   CL 101 12/25/2017 0404   CO2 23 12/25/2017 0404   GLUCOSE 450 (H) 12/25/2017 0404   BUN 16 12/25/2017 0404   CREATININE 1.12 (H) 12/25/2017 0404   CREATININE 1.01 (H) 10/10/2015 1424   CALCIUM 7.8 (L) 12/25/2017 0404   GFRNONAA 40 (L) 12/25/2017 0404   GFRAA 46 (L) 12/25/2017 0404     Assessment/Plan: 1 Day Post-Op   Principal Problem:   Fracture of femoral neck, left (HCC) Active Problems:   Dyslipidemia   Essential hypertension   CAD S/P percutaneous coronary angioplasty   Edema   Pacemaker   Permanent atrial fibrillation (HCC)   Chronic diastolic CHF (congestive heart failure) (HCC)   Type II diabetes mellitus, well controlled ([place])   Hypothyroid   Advance diet Up with therapy WBAT  Dry dressing PRN   DOUGLAS PARRY, BRANDON 12/25/2017, 8:53 AM  Discussed and agree with above.  WBAT.  Eliquis, dry dressing changes as needed, RTC with me in 2 weeks.  dispo to  [facility] when medically stable.    [name], MD Cell [phone]

## 2017-12-25 NOTE — Clinical Social Work Note (Signed)
Clinical Social Work Assessment  Patient Details  Name: [name] MRN: [id number] Date of Birth: [birth year]  Date of referral:  12/25/17               Reason for consult:  Facility Placement, Discharge Planning                Permission sought to share information with:  Family Supports, Customer service manager Permission granted to share information::  Yes, Verbal Permission Granted  Name::     Jiles Garter; Glenetta Borg  Agency::  [facility] [place]  Relationship::  neices  Contact Information:  [phone]; [phone]  Housing/Transportation Living arrangements for the past 2 months:  [place], [place] of Information:  Patient Patient Interpreter Needed:  None Criminal Activity/Legal Involvement Pertinent to Current Situation/Hospitalization:  No - Comment as needed Significant Relationships:  Other Family Members, Community Support Lives with:  Facility Resident Do you feel safe going back to the place where you live?  Yes Need for family participation in patient care:  Yes (Comment)  Care giving concerns:  Pt prior to discharge to [place] was living at home alone, has two nieces that are active but not able to provide assistance currently required 24/7.    Social Worker assessment / plan:  CSW spoke with pt at bedside, pt niece also present. Pt very hard of hearing but amenable to speaking with CSW. Pt states she came back to hospital from [place] and would like to return at discharge. Pt niece in agreement, pt does not have any children and her nieces are her main source of support. CSW has initiated insurance authorization and confirmed with [facility] that they are able to take pt back at discharge.   Employment status:  Retired Nurse, adult PT Recommendations:  [place], [place] / Referral to community resources:  [place]  Patient/Family's Response to care: Pt accepting of CSW visit, requires support with being able to hear CSW but when she understood she was able to consent to return to [facility] at discharge.   Patient/Family's Understanding of and Emotional Response to Diagnosis, Current Treatment, and Prognosis:  Pt and pt family state understanding of diagnosis, current treatment and prognosis. Pt states discomfort from her heel (pt niece states that she has a blister on it currently). Pt states agreement with return to [facility] and pt/pt family express reasonable expectation for pt progress and needs. Pt and pt family emotionally appropriate and understand that they can ask RN to call CSW if needed.   Emotional Assessment Appearance:  Appears younger than stated age Attitude/Demeanor/Rapport:  Gracious, Engaged Affect (typically observed):  Pleasant, Accepting, Apprehensive Orientation:  Oriented to Self, Oriented to Place, Oriented to  Time, Oriented to Situation Alcohol / Substance use:  Not Applicable Psych involvement (Current and /or in the community):  No (Comment)  Discharge Needs  Concerns to be addressed:  Care Coordination Readmission within the last 30 days:  Yes Current discharge risk:  Dependent with Mobility, Physical Impairment Barriers to Discharge:  Continued Medical Work up, [employer], [place] 12/25/2017, 12:05 PM

## 2017-12-25 NOTE — Progress Notes (Signed)
Physical Therapy Treatment Patient Details Name: [name] MRN: [id number] DOB: Oct 06, 1916 Today's Date: 12/25/2017    History of Present Illness  84 y/o female readmitted with pain on L hip after ORIF was done on 12/07/17 for L femoral neck fracture and now converted to hemiarthroplasty with osteoporotic changes to bone noted.  PMHx:  HTN, colon CA, CAD, a-fib, falls, hypothyroidism,     PT Comments    Pt was having a great deal of pain complaints this afternoon and PT arrived to ask her to move LLE.  Family in and then nursing who report pt has had a great deal of meds, and should not have much more.  Nursing gave what pt could have but pt is moaning continually.  Family member attempted to get her to stop but pt is very persistent with the moaning.  Active assisted LLE movement was done and repositioning on the bed.  Follow up with her daily to continue to work toward her transition to SNF and home when able.   Follow Up Recommendations  SNF     Equipment Recommendations  None recommended by PT    Recommendations for Other Services       Precautions / Restrictions Precautions Precautions: Fall;Posterior Hip Precaution Booklet Issued: Yes (comment) Restrictions Weight Bearing Restrictions: Yes LLE Weight Bearing: Weight bearing as tolerated    Mobility  Bed Mobility Overal bed mobility: Needs Assistance Bed Mobility: Supine to Sit;Sit to Supine     Supine to sit: Max assist Sit to supine: Total assist   General bed mobility comments: pt was assisted to scoot up in bed with max to total assist  Transfers Overall transfer level: Needs assistance Equipment used: Rolling walker (2 wheeled) Transfers: Sit to/from Stand Sit to Stand: Max assist;From elevated surface         General transfer comment: declined OOB due to moaning and pain complaints  Ambulation/Gait             General Gait Details: unable to take steps   Stairs              Wheelchair Mobility    Modified Rankin (Stroke Patients Only)       Balance     Sitting balance-Leahy Scale: Fair Sitting balance - Comments: EOB with PT maintaining contact for safety                                    Cognition Arousal/Alertness: Awake/alert Behavior During Therapy: Anxious Overall Cognitive Status: Within Functional Limits for tasks assessed                                        Exercises Total Joint Exercises Ankle Circles/Pumps: AAROM;Left;5 reps Heel Slides: AAROM;Left;10 reps Hip ABduction/ADduction: AAROM;Left;15 reps Knee Flexion: AAROM;Left;10 reps    General Comments        Pertinent Vitals/Pain Pain Assessment: Faces Faces Pain Scale: Hurts even more Pain Location: Left hip Pain Descriptors / Indicators: Operative site guarding;Grimacing Pain Intervention(s): Limited activity within patient's tolerance;Monitored during session;Premedicated before session;Repositioned;Ice applied    Home Living Family/patient expects to be discharged to:: Skilled nursing facility               Additional Comments: Pt was living alone prior to fall. Neighbors were in the room checking on her  prior to the start of physical therapy. Husband is deceased.    Prior Function Level of Independence: Independent with assistive device(s)      Comments: Pt was using a cane or stick on occasions to get around with in home and community   PT Goals (current goals can now be found in the care plan section) Acute Rehab PT Goals Patient Stated Goal: none stated PT Goal Formulation: Patient unable to participate in goal setting Time For Goal Achievement: 01/08/18 Potential to Achieve Goals: Fair    Frequency    Min 2X/week      PT Plan      Co-evaluation              AM-PAC PT "6 Clicks" Daily Activity  Outcome Measure  Difficulty turning over in bed (including adjusting bedclothes, sheets and blankets)?:  Unable Difficulty moving from lying on back to sitting on the side of the bed? : Unable Difficulty sitting down on and standing up from a chair with arms (e.g., wheelchair, bedside commode, etc,.)?: Unable Help needed moving to and from a bed to chair (including a wheelchair)?: Total Help needed walking in hospital room?: Total Help needed climbing 3-5 steps with a railing? : Total 6 Click Score: 6    End of Session Equipment Utilized During Treatment: Gait belt Activity Tolerance: Patient limited by fatigue;Patient limited by pain Patient left: in bed;with call bell/phone within reach;with bed alarm set;with family/visitor present;with nursing/sitter in room Nurse Communication: Mobility status PT Visit Diagnosis: Other abnormalities of gait and mobility (R26.89);Muscle weakness (generalized) (M62.81);History of falling (Z91.81);Pain Pain - Right/Left: Left Pain - part of body: Hip     Time: 1719-1735 PT Time Calculation (min) (ACUTE ONLY): 16 min  Charges:  $Therapeutic Exercise: 8-22 mins $Therapeutic Activity: 8-22 mins                   Ramond Dial 12/25/2017, 8:00 PM   Mee Hives, PT MS Acute Rehab Dept. Number: [place]

## 2017-12-25 NOTE — Anesthesia Postprocedure Evaluation (Signed)
Anesthesia Post Note  Patient: [name]  Procedure(s) Performed: LEFT ARTHROPLASTY BIPOLAR HIP (HEMIARTHROPLASTY) (Left Hip)     Patient location during evaluation: PACU Anesthesia Type: General Level of consciousness: awake and alert Pain management: pain level controlled Vital Signs Assessment: post-procedure vital signs reviewed and stable Respiratory status: spontaneous breathing, nonlabored ventilation and respiratory function stable Cardiovascular status: blood pressure returned to baseline and stable Postop Assessment: no apparent nausea or vomiting Anesthetic complications: no    Last Vitals:  Vitals:   12/24/17 2132 12/25/17 0248  BP: (!) 144/83 (!) 103/44  Pulse: (!) 59 (!) 58  Resp: 17 17  Temp: (!) 36.4 C 37 C  SpO2: 99% 95%    Last Pain:  Vitals:   12/25/17 0629  TempSrc:   PainSc: 3                  Xoey Warmoth,W. EDMOND

## 2017-12-25 NOTE — Discharge Instructions (Signed)
INSTRUCTIONS AFTER JOINT REPLACEMENT  ° °o Remove items at home which could result in a fall. This includes throw rugs or furniture in walking pathways °o ICE to the affected joint every three hours while awake for 30 minutes at a time, for at least the first 3-5 days, and then as needed for pain and swelling.  Continue to use ice for pain and swelling. You may notice swelling that will progress down to the foot and ankle.  This is normal after surgery.  Elevate your leg when you are not up walking on it.   °o Continue to use the breathing machine you got in the hospital (incentive spirometer) which will help keep your temperature down.  It is common for your temperature to cycle up and down following surgery, especially at night when you are not up moving around and exerting yourself.  The breathing machine keeps your lungs expanded and your temperature down. ° ° °DIET:  As you were doing prior to hospitalization, we recommend a well-balanced diet. ° °DRESSING / WOUND CARE / SHOWERING ° °You may change your dressing 3-5 days after surgery.  Then change the dressing every day with sterile gauze.  Please use good hand washing techniques before changing the dressing.  Do not use any lotions or creams on the incision until instructed by your surgeon. ° °ACTIVITY ° °o Increase activity slowly as tolerated, but follow the weight bearing instructions below.   °o No driving for 6 weeks or until further direction given by your physician.  You cannot drive while taking narcotics.  °o No lifting or carrying greater than 10 lbs. until further directed by your surgeon. °o Avoid periods of inactivity such as sitting longer than an hour when not asleep. This helps prevent blood clots.  °o You may return to work once you are authorized by your doctor.  ° ° ° °WEIGHT BEARING  ° °Weight bearing as tolerated with assist device (walker, cane, etc) as directed, use it as long as suggested by your surgeon or therapist, typically at  least 4-6 weeks. ° ° °EXERCISES ° °Results after joint replacement surgery are often greatly improved when you follow the exercise, range of motion and muscle strengthening exercises prescribed by your doctor. Safety measures are also important to protect the joint from further injury. Any time any of these exercises cause you to have increased pain or swelling, decrease what you are doing until you are comfortable again and then slowly increase them. If you have problems or questions, call your caregiver or physical therapist for advice.  ° °Rehabilitation is important following a joint replacement. After just a few days of immobilization, the muscles of the leg can become weakened and shrink (atrophy).  These exercises are designed to build up the tone and strength of the thigh and leg muscles and to improve motion. Often times heat used for twenty to thirty minutes before working out will loosen up your tissues and help with improving the range of motion but do not use heat for the first two weeks following surgery (sometimes heat can increase post-operative swelling).  ° °These exercises can be done on a training (exercise) mat, on the floor, on a table or on a bed. Use whatever works the best and is most comfortable for you.    Use music or television while you are exercising so that the exercises are a pleasant break in your day. This will make your life better with the exercises acting as a break   in your routine that you can look forward to.   Perform all exercises about fifteen times, three times per day or as directed.  You should exercise both the operative leg and the other leg as well. ° °Exercises include: °  °• Quad Sets - Tighten up the muscle on the front of the thigh (Quad) and hold for 5-10 seconds.   °• Straight Leg Raises - With your knee straight (if you were given a brace, keep it on), lift the leg to 60 degrees, hold for 3 seconds, and slowly lower the leg.  Perform this exercise against  resistance later as your leg gets stronger.  °• Leg Slides: Lying on your back, slowly slide your foot toward your buttocks, bending your knee up off the floor (only go as far as is comfortable). Then slowly slide your foot back down until your leg is flat on the floor again.  °• Angel Wings: Lying on your back spread your legs to the side as far apart as you can without causing discomfort.  °• Hamstring Strength:  Lying on your back, push your heel against the floor with your leg straight by tightening up the muscles of your buttocks.  Repeat, but this time bend your knee to a comfortable angle, and push your heel against the floor.  You may put a pillow under the heel to make it more comfortable if necessary.  ° °A rehabilitation program following joint replacement surgery can speed recovery and prevent re-injury in the future due to weakened muscles. Contact your doctor or a physical therapist for more information on knee rehabilitation.  ° ° °CONSTIPATION ° °Constipation is defined medically as fewer than three stools per week and severe constipation as less than one stool per week.  Even if you have a regular bowel pattern at home, your normal regimen is likely to be disrupted due to multiple reasons following surgery.  Combination of anesthesia, postoperative narcotics, change in appetite and fluid intake all can affect your bowels.  ° °YOU MUST use at least one of the following options; they are listed in order of increasing strength to get the job done.  They are all available over the counter, and you may need to use some, POSSIBLY even all of these options:   ° °Drink plenty of fluids (prune juice may be helpful) and high fiber foods °Colace 100 mg by mouth twice a day  °Senokot for constipation as directed and as needed Dulcolax (bisacodyl), take with full glass of water  °Miralax (polyethylene glycol) once or twice a day as needed. ° °If you have tried all these things and are unable to have a bowel  movement in the first 3-4 days after surgery call either your surgeon or your primary doctor.   ° °If you experience loose stools or diarrhea, hold the medications until you stool forms back up.  If your symptoms do not get better within 1 week or if they get worse, check with your doctor.  If you experience "the worst abdominal pain ever" or develop nausea or vomiting, please contact the office immediately for further recommendations for treatment. ° ° °ITCHING:  If you experience itching with your medications, try taking only a single pain pill, or even half a pain pill at a time.  You can also use Benadryl over the counter for itching or also to help with sleep.  ° °TED HOSE STOCKINGS:  Use stockings on both legs until for at least 2 weeks or as   directed by physician office. They may be removed at night for sleeping.  MEDICATIONS:  See your medication summary on the After Visit Summary that nursing will review with you.  You may have some home medications which will be placed on hold until you complete the course of blood thinner medication.  It is important for you to complete the blood thinner medication as prescribed.  PRECAUTIONS:  If you experience chest pain or shortness of breath - call 911 immediately for transfer to the hospital emergency department.   If you develop a fever greater that 101 F, purulent drainage from wound, increased redness or drainage from wound, foul odor from the wound/dressing, or calf pain - CONTACT YOUR SURGEON.                                                   FOLLOW-UP APPOINTMENTS:  If you do not already have a post-op appointment, please call the office for an appointment to be seen by your surgeon.  Guidelines for how soon to be seen are listed in your After Visit Summary, but are typically between 1-4 weeks after surgery.  OTHER INSTRUCTIONS:   Knee Replacement:  Do not place pillow under knee, focus on keeping the knee straight while resting. CPM  instructions: 0-90 degrees, 2 hours in the morning, 2 hours in the afternoon, and 2 hours in the evening. Place foam block, curve side up under heel at all times except when in CPM or when walking.  DO NOT modify, tear, cut, or change the foam block in any way.  MAKE SURE YOU:   Understand these instructions.   Get help right away if you are not doing well or get worse.    Thank you for letting us be a part of your medical care team.  It is a privilege we respect greatly.  We hope these instructions will help you stay on track for a fast and full recovery!    Information on my medicine - ELIQUIS (apixaban)  Why was Eliquis prescribed for you? Eliquis was prescribed for you to reduce the risk of a blood clot forming that can cause a stroke if you have a medical condition called atrial fibrillation (a type of irregular heartbeat).  What do You need to know about Eliquis ? Take your Eliquis TWICE DAILY - one tablet in the morning and one tablet in the evening with or without food. If you have difficulty swallowing the tablet whole please discuss with your pharmacist how to take the medication safely.  Take Eliquis exactly as prescribed by your doctor and DO NOT stop taking Eliquis without talking to the doctor who prescribed the medication.  Stopping may increase your risk of developing a stroke.  Refill your prescription before you run out.  After discharge, you should have regular check-up appointments with your healthcare provider that is prescribing your Eliquis.  In the future your dose may need to be changed if your kidney function or weight changes by a significant amount or as you get older.  What do you do if you miss a dose? If you miss a dose, take it as soon as you remember on the same day and resume taking twice daily.  Do not take more than one dose of ELIQUIS at the same time to make up a missed dose.  Important  Safety Information A possible side effect of Eliquis is  bleeding. You should call your healthcare provider right away if you experience any of the following: ? Bleeding from an injury or your nose that does not stop. ? Unusual colored urine (red or dark brown) or unusual colored stools (red or black). ? Unusual bruising for unknown reasons. ? A serious fall or if you hit your head (even if there is no bleeding).  Some medicines may interact with Eliquis and might increase your risk of bleeding or clotting while on Eliquis. To help avoid this, consult your healthcare provider or pharmacist prior to using any new prescription or non-prescription medications, including herbals, vitamins, non-steroidal anti-inflammatory drugs (NSAIDs) and supplements.  This website has more information on Eliquis (apixaban): http://[email or web address].com/eliquis/home

## 2017-12-25 NOTE — Evaluation (Signed)
Physical Therapy Evaluation Patient Details Name: [name] MRN: [id number] DOB: Jan 06, 1917 Today's Date: 12/25/2017   History of Present Illness   84 y/o female readmitted with pain on L hip after ORIF was done on 12/07/17 for L femoral neck fracture and now converted to hemiarthroplasty with osteoporotic changes to bone noted.  PMHx:  HTN, colon CA, CAD, a-fib, falls, hypothyroidism,   Clinical Impression  Pt was seen for evaluation of mobility and strength with limited standing tolerance noted. However she was a revised surgery and was in significant pain preceding her surgery.  Progress toward her goal to return to SNF, and will hopefully be able to resume gait and transfers as prior to the second surgery soon.  Follow acutely for balance and strengthening toward these goals.     Follow Up Recommendations SNF    Equipment Recommendations  None recommended by PT    Recommendations for Other Services       Precautions / Restrictions Precautions Precautions: Fall;Posterior Hip Precaution Booklet Issued: Yes (comment) Restrictions Weight Bearing Restrictions: Yes LLE Weight Bearing: Weight bearing as tolerated      Mobility  Bed Mobility Overal bed mobility: Needs Assistance Bed Mobility: Supine to Sit;Sit to Supine     Supine to sit: Max assist Sit to supine: Total assist   General bed mobility comments: pt declined to assist with getting back to bed, moaning and required assist to get back  Transfers Overall transfer level: Needs assistance Equipment used: Rolling walker (2 wheeled) Transfers: Sit to/from Stand Sit to Stand: Max assist;From elevated surface         General transfer comment: partial standing only  Ambulation/Gait             General Gait Details: unable to take steps  Stairs            Wheelchair Mobility    Modified Rankin (Stroke Patients Only)       Balance     Sitting balance-Leahy Scale: Fair Sitting balance -  Comments: EOB with PT maintaining contact for safety                                     Pertinent Vitals/Pain Pain Assessment: Faces Faces Pain Scale: Hurts even more Pain Location: Left hip Pain Descriptors / Indicators: Operative site guarding;Grimacing Pain Intervention(s): Limited activity within patient's tolerance;Monitored during session;Premedicated before session;Repositioned;Ice applied    Home Living Family/patient expects to be discharged to:: Skilled nursing facility                 Additional Comments: Pt was living alone prior to fall. Neighbors were in the room checking on her prior to the start of physical therapy. Husband is deceased.    Prior Function Level of Independence: Independent with assistive device(s)         Comments: Pt was using a cane or stick on occasions to get around with in home and community     Hand Dominance   Dominant Hand: Right    Extremity/Trunk Assessment   Upper Extremity Assessment Upper Extremity Assessment: Overall WFL for tasks assessed    Lower Extremity Assessment Lower Extremity Assessment: LLE deficits/detail LLE Deficits / Details: Decreased strength and AROM consistent with above mentioned injury and subsequent surgery LLE: Unable to fully assess due to pain;Unable to fully assess due to immobilization LLE Coordination: decreased fine motor;decreased gross motor  Cervical / Trunk Assessment Cervical / Trunk Assessment: Kyphotic  Communication   Communication: HOH(hearing aid is not in hospital yet)  Cognition Arousal/Alertness: Awake/alert Behavior During Therapy: Anxious Overall Cognitive Status: Within Functional Limits for tasks assessed                                        General Comments      Exercises     Assessment/Plan    PT Assessment Patient needs continued PT services  PT Problem List Decreased strength;Decreased range of motion;Decreased activity  tolerance;Decreased balance;Decreased mobility;Decreased coordination;Decreased knowledge of use of DME;Decreased safety awareness;Decreased skin integrity;Pain       PT Treatment Interventions DME instruction;Gait training;Functional mobility training;Therapeutic activities;Therapeutic exercise;Balance training;Neuromuscular re-education;Patient/family education    PT Goals (Current goals can be found in the Care Plan section)  Acute Rehab PT Goals Patient Stated Goal: none stated PT Goal Formulation: Patient unable to participate in goal setting Time For Goal Achievement: 01/08/18 Potential to Achieve Goals: Fair    Frequency Min 2X/week   Barriers to discharge Decreased caregiver support;Inaccessible home environment has been living alone    Co-evaluation               AM-PAC PT "6 Clicks" Daily Activity  Outcome Measure Difficulty turning over in bed (including adjusting bedclothes, sheets and blankets)?: Unable Difficulty moving from lying on back to sitting on the side of the bed? : Unable Difficulty sitting down on and standing up from a chair with arms (e.g., wheelchair, bedside commode, etc,.)?: Unable Help needed moving to and from a bed to chair (including a wheelchair)?: Total Help needed walking in hospital room?: Total Help needed climbing 3-5 steps with a railing? : Total 6 Click Score: 6    End of Session Equipment Utilized During Treatment: Gait belt Activity Tolerance: Patient limited by fatigue;Patient limited by pain Patient left: in bed;with call bell/phone within reach;with bed alarm set;with family/visitor present;with nursing/sitter in room Nurse Communication: Mobility status PT Visit Diagnosis: Other abnormalities of gait and mobility (R26.89);Muscle weakness (generalized) (M62.81);History of falling (Z91.81);Pain Pain - Right/Left: Left Pain - part of body: Hip    Time: 3846-6599 PT Time Calculation (min) (ACUTE ONLY): 27 min   Charges:    PT Evaluation $PT Eval Moderate Complexity: 1 Mod PT Treatments $Therapeutic Activity: 8-22 mins        Ramond Dial 12/25/2017, 7:53 PM   Mee Hives, PT MS Acute Rehab Dept. Number: [place] and [place]

## 2017-12-25 NOTE — Progress Notes (Signed)
PROGRESS NOTE    [name]  KCL:[id number] DOB: 1917-01-23 DOA: 12/23/2017 PCP: Leeroy Cha, MD (Confirm with patient/family/NH records and if not entered, this HAS to be entered at [facility] point of entry. "No PCP" if truly none.)   Brief Narrative: (Start on day 1 of progress note - keep it brief and live) 84 year old female with history of hypothyroidism, hypertension, diabetes mellitus, colon cancer, CAD S/B PTCA, history of Mobitz type II S/P pacemaker placement, permanent atrial fibrillation on Eliquis had nondisplaced left hip fracture about 2 weeks back underwent cannulated hip pinning on 12/07/2017.  Since then patient continued to have worsening of the pain in the left hip, progressive dysfunction of the limp.  Patient was also noted to have increasing bilateral lower extremity edema concerning this patient was brought back to the emergency department.  Consulted orthopedic surgery plan to do open reduction and internal fixation of the left hip.  Per family patient is well functional at baseline walks around the house.  Underwent open reduction and internal fixation of left femoral neck fracture.   Assessment & Plan:   Principal Problem:   Fracture of femoral neck, left (HCC) Active Problems:   Dyslipidemia   Essential hypertension   CAD S/P percutaneous coronary angioplasty   Edema   Pacemaker   Permanent atrial fibrillation (HCC)   Chronic diastolic CHF (congestive heart failure) (HCC)   Type II diabetes mellitus, well controlled ([place])   Hypothyroid   ##Left femoral neck fracture -Failure with pending -Plan for open reduction and internal fixation today -Continue with pain management as needed  -evaluated by PT, recommended SNF -Line to discharge patient to SNF tomorrow  ##Acute blood loss anemia -Follow-up with CBC  ##Hyponatremia mild -SIADH from pain -Follow-up with the BMP a.m.  ##Bilateral lower extremity edema -Patient has preserved left  ventricular function with EF of 50 to 55% -Concern about venous stasis edema -Continue to follow up with elevating lower extremities  ##Hypertension -Currently well controlled   ##Atrial fibrillation permanent -Currently rate is controlled -Hold Eliquis for the surgery  ##Diabetes mellitus with hyperglycemia  -keep the patient on diabetic diet -Hemoglobin A1c was 7.1 on 12/07/2088 -Continue on sliding scale insulin  ##Hypothyroidism -Continue with Synthroid  ##UTI - Keep on cefdinir for total of 7 days     DVT prophylaxis: (Lovenox/Heparin/SCD's/anticoagulated/None (if comfort care) Code Status: (Full/Partial - specify details) Family Communication: (Specify name, relationship & date discussed. NO "discussed with patient") Disposition Plan: (specify when and where you expect patient to be discharged). Include barriers to DC in this tab.   Consultants:   Orthopedic surgery  Procedures:  ORIF of left femoral neck fracture on 12/24/2017   Antimicrobials: Cefdinir-8/15 -   Subjective: Denied having any complaints this morning.  Patient states it does not feel pain when she does not move  Objective: Vitals:   12/25/17 0248 12/25/17 0638 12/25/17 1014 12/25/17 1411  BP: (!) 103/44 (!) 150/64 (!) 146/59 (!) 135/53  Pulse: (!) 58 60 61 (!) 59  Resp: 17 16 19 14   Temp: 98.6 F (37 C) 98.2 F (36.8 C) 98 F (36.7 C) 98.8 F (37.1 C)  TempSrc: Oral Oral Oral Oral  SpO2: 95% 98% 98% 96%  Weight:      Height:        Intake/Output Summary (Last 24 hours) at 12/25/2017 1523 Last data filed at 12/25/2017 1407 Gross per 24 hour  Intake 3015.44 ml  Output 1250 ml  Net 1765.44 ml   Filed  Weights   12/23/17 1700  Weight: 77.6 kg    Examination:  General exam: Appears calm and comfortable  Respiratory system: Clear to auscultation. Respiratory effort normal. Cardiovascular system: S1 & S2 heard, RRR. No JVD, murmurs, rubs, gallops or clicks. No pedal  edema. Gastrointestinal system: Abdomen is nondistended, soft and nontender. No organomegaly or masses felt. Normal bowel sounds heard. Central nervous system: Alert and oriented. No focal neurological deficits. Extremities: Left lower extremity tenderness to palpation, range of motion  skin: No rashes, lesions or ulcers Psychiatry: Judgement and insight appear normal. Mood & affect appropriate.     Data Reviewed: I have personally reviewed following labs and imaging studies  CBC: Recent Labs  Lab 12/23/17 1332 12/24/17 0440 12/25/17 0404  WBC 9.9 8.1 9.7  NEUTROABS 7.0  --   --   HGB 11.8* 10.7* 9.3*  HCT 35.9* 33.4* 29.0*  MCV 95.2 96.8 96.7  PLT 437* 425* 646   Basic Metabolic Panel: Recent Labs  Lab 12/23/17 1332 12/24/17 0440 12/25/17 0404  NA 137 137 132*  K 3.9 3.6 4.3  CL 103 104 101  CO2 24 26 23   GLUCOSE 246* 144* 450*  BUN 12 11 16   CREATININE 0.88 0.94 1.12*  CALCIUM 9.0 8.3* 7.8*   GFR: Estimated Creatinine Clearance: 27.7 mL/min (A) (by C-G formula based on SCr of 1.12 mg/dL (H)). Liver Function Tests: Recent Labs  Lab 12/23/17 1332  AST 20  ALT 14  ALKPHOS 117  BILITOT 1.0  PROT 7.1  ALBUMIN 3.1*   No results for input(s): LIPASE, AMYLASE in the last 168 hours. No results for input(s): AMMONIA in the last 168 hours. Coagulation Profile: No results for input(s): INR, PROTIME in the last 168 hours. Cardiac Enzymes: Recent Labs  Lab 12/23/17 1332  TROPONINI <0.03   BNP (last 3 results) No results for input(s): PROBNP in the last 8760 hours. HbA1C: No results for input(s): HGBA1C in the last 72 hours. CBG: Recent Labs  Lab 12/24/17 1255 12/24/17 1735 12/24/17 2127 12/25/17 0736 12/25/17 1200  GLUCAP 149* 168* 245* 372* 254*   Lipid Profile: No results for input(s): CHOL, HDL, LDLCALC, TRIG, CHOLHDL, LDLDIRECT in the last 72 hours. Thyroid Function Tests: No results for input(s): TSH, T4TOTAL, FREET4, T3FREE, THYROIDAB in the  last 72 hours. Anemia Panel: No results for input(s): VITAMINB12, FOLATE, FERRITIN, TIBC, IRON, RETICCTPCT in the last 72 hours. Sepsis Labs: No results for input(s): PROCALCITON, LATICACIDVEN in the last 168 hours.  Recent Results (from the past 240 hour(s))  Surgical pcr screen     Status: None   Collection Time: 12/23/17  8:08 PM  Result Value Ref Range Status   MRSA, PCR NEGATIVE NEGATIVE Final   Staphylococcus aureus NEGATIVE NEGATIVE Final    Comment: (NOTE) The Xpert SA Assay (FDA approved for NASAL specimens in patients 76 years of age and older), is one component of a comprehensive surveillance program. It is not intended to diagnose infection nor to guide or monitor treatment. Performed at [facility], [place] [address], [place], [place] [address]          Radiology Studies: Dg Hip Port Unilat With Pelvis 1v Left  Result Date: 12/24/2017 CLINICAL DATA:  Status post hip surgery EXAM: DG HIP (WITH OR WITHOUT PELVIS) 1V PORT LEFT COMPARISON:  12/07/2017 FINDINGS: Prior hernia repair. Limited evaluation of right femoral neck due to superimposition of trochanter. Pubic symphysis and rami are intact. Interval left hip replacement with normal alignment and  no obvious fracture. Gas within the soft tissues lateral to the hip. Vascular calcification IMPRESSION: Interval left hip replacement with expected postsurgical changes. Electronically Signed   By: Donavan Foil M.D.   On: 12/24/2017 18:08        Scheduled Meds: . acetaminophen  500 mg Oral Q6H  . apixaban  5 mg Oral BID  . cefdinir  300 mg Oral Q12H  . docusate sodium  100 mg Oral BID  . escitalopram  5 mg Oral Daily  . ferrous sulfate  325 mg Oral Q12H  . ferrous sulfate  325 mg Oral TID PC  . furosemide  40 mg Oral Daily  . insulin aspart  0-5 Units Subcutaneous QHS  . insulin aspart  0-9 Units Subcutaneous TID WC  . levothyroxine  88 mcg Oral QAC breakfast  . losartan  100 mg Oral Daily  . potassium  chloride SA  20 mEq Oral Daily  . simvastatin  40 mg Oral QHS   Continuous Infusions:    LOS: 2 days    Time spent: 40 minutes    Laloni Rowton, MD [facility] Pager [phone]  If 7PM-7AM, please contact night-coverage [email or web address] Password [facility] 12/25/2017, 3:23 PM

## 2017-12-25 NOTE — Progress Notes (Signed)
Patient unable to urinate, bladdr scan showing >600 ml urine in the bladder. MD made aware.

## 2017-12-25 NOTE — Social Work (Signed)
CSW has initiated insurance authorization for pt through [employer]. Will f/u with pt to complete assessment, await PT/OT notes when medically appropriate.    Alexander Mt, [place] Work [phone]

## 2017-12-26 DIAGNOSIS — L89623 Pressure ulcer of left heel, stage 3: Secondary | ICD-10-CM | POA: Diagnosis not present

## 2017-12-26 DIAGNOSIS — S72002A Fracture of unspecified part of neck of left femur, initial encounter for closed fracture: Secondary | ICD-10-CM | POA: Diagnosis not present

## 2017-12-26 DIAGNOSIS — Z9861 Coronary angioplasty status: Secondary | ICD-10-CM | POA: Diagnosis not present

## 2017-12-26 DIAGNOSIS — E119 Type 2 diabetes mellitus without complications: Secondary | ICD-10-CM | POA: Diagnosis not present

## 2017-12-26 DIAGNOSIS — E43 Unspecified severe protein-calorie malnutrition: Secondary | ICD-10-CM | POA: Diagnosis not present

## 2017-12-26 DIAGNOSIS — M2550 Pain in unspecified joint: Secondary | ICD-10-CM | POA: Diagnosis not present

## 2017-12-26 DIAGNOSIS — Z7901 Long term (current) use of anticoagulants: Secondary | ICD-10-CM | POA: Diagnosis not present

## 2017-12-26 DIAGNOSIS — K5900 Constipation, unspecified: Secondary | ICD-10-CM | POA: Diagnosis not present

## 2017-12-26 DIAGNOSIS — Z950 Presence of cardiac pacemaker: Secondary | ICD-10-CM | POA: Diagnosis not present

## 2017-12-26 DIAGNOSIS — N390 Urinary tract infection, site not specified: Secondary | ICD-10-CM | POA: Diagnosis not present

## 2017-12-26 DIAGNOSIS — F419 Anxiety disorder, unspecified: Secondary | ICD-10-CM | POA: Diagnosis not present

## 2017-12-26 DIAGNOSIS — I10 Essential (primary) hypertension: Secondary | ICD-10-CM | POA: Diagnosis not present

## 2017-12-26 DIAGNOSIS — Z7401 Bed confinement status: Secondary | ICD-10-CM | POA: Diagnosis not present

## 2017-12-26 DIAGNOSIS — E7849 Other hyperlipidemia: Secondary | ICD-10-CM | POA: Diagnosis not present

## 2017-12-26 DIAGNOSIS — S72002D Fracture of unspecified part of neck of left femur, subsequent encounter for closed fracture with routine healing: Secondary | ICD-10-CM | POA: Diagnosis not present

## 2017-12-26 DIAGNOSIS — I482 Chronic atrial fibrillation: Secondary | ICD-10-CM | POA: Diagnosis not present

## 2017-12-26 DIAGNOSIS — I2510 Atherosclerotic heart disease of native coronary artery without angina pectoris: Secondary | ICD-10-CM | POA: Diagnosis not present

## 2017-12-26 DIAGNOSIS — Z471 Aftercare following joint replacement surgery: Secondary | ICD-10-CM | POA: Diagnosis not present

## 2017-12-26 DIAGNOSIS — R8271 Bacteriuria: Secondary | ICD-10-CM | POA: Diagnosis not present

## 2017-12-26 DIAGNOSIS — E039 Hypothyroidism, unspecified: Secondary | ICD-10-CM | POA: Diagnosis not present

## 2017-12-26 DIAGNOSIS — R609 Edema, unspecified: Secondary | ICD-10-CM | POA: Diagnosis not present

## 2017-12-26 DIAGNOSIS — E785 Hyperlipidemia, unspecified: Secondary | ICD-10-CM | POA: Diagnosis not present

## 2017-12-26 DIAGNOSIS — L89622 Pressure ulcer of left heel, stage 2: Secondary | ICD-10-CM | POA: Diagnosis not present

## 2017-12-26 DIAGNOSIS — I441 Atrioventricular block, second degree: Secondary | ICD-10-CM | POA: Diagnosis not present

## 2017-12-26 DIAGNOSIS — F329 Major depressive disorder, single episode, unspecified: Secondary | ICD-10-CM | POA: Diagnosis not present

## 2017-12-26 DIAGNOSIS — M25552 Pain in left hip: Secondary | ICD-10-CM | POA: Diagnosis not present

## 2017-12-26 DIAGNOSIS — I480 Paroxysmal atrial fibrillation: Secondary | ICD-10-CM | POA: Diagnosis not present

## 2017-12-26 DIAGNOSIS — Z96642 Presence of left artificial hip joint: Secondary | ICD-10-CM | POA: Diagnosis not present

## 2017-12-26 DIAGNOSIS — I5032 Chronic diastolic (congestive) heart failure: Secondary | ICD-10-CM | POA: Diagnosis not present

## 2017-12-26 DIAGNOSIS — Z4801 Encounter for change or removal of surgical wound dressing: Secondary | ICD-10-CM | POA: Diagnosis not present

## 2017-12-26 DIAGNOSIS — M810 Age-related osteoporosis without current pathological fracture: Secondary | ICD-10-CM | POA: Diagnosis not present

## 2017-12-26 LAB — BASIC METABOLIC PANEL
Anion gap: 11 (ref 5–15)
BUN: 15 mg/dL (ref 8–23)
CO2: 23 mmol/L (ref 22–32)
CREATININE: 1.11 mg/dL — AB (ref 0.44–1.00)
Calcium: 8.4 mg/dL — ABNORMAL LOW (ref 8.9–10.3)
Chloride: 98 mmol/L (ref 98–111)
GFR calc Af Amer: 46 mL/min — ABNORMAL LOW (ref >60)
GFR, EST NON AFRICAN AMERICAN: 40 mL/min — AB (ref >60)
Glucose, Bld: 168 mg/dL — ABNORMAL HIGH (ref 70–99)
POTASSIUM: 4.2 mmol/L (ref 3.5–5.1)
SODIUM: 132 mmol/L — AB (ref 135–145)

## 2017-12-26 LAB — CBC
HCT: 29.1 % — ABNORMAL LOW (ref 36.0–46.0)
Hemoglobin: 9.4 g/dL — ABNORMAL LOW (ref 12.0–15.0)
MCH: 31.4 pg (ref 26.0–34.0)
MCHC: 32.3 g/dL (ref 30.0–36.0)
MCV: 97.3 fL (ref 78.0–100.0)
PLATELETS: 325 10*3/uL (ref 150–400)
RBC: 2.99 MIL/uL — AB (ref 3.87–5.11)
RDW: 13.9 % (ref 11.5–15.5)
WBC: 11.2 10*3/uL — ABNORMAL HIGH (ref 4.0–10.5)

## 2017-12-26 LAB — GLUCOSE, CAPILLARY
Glucose-Capillary: 166 mg/dL — ABNORMAL HIGH (ref 70–99)
Glucose-Capillary: 175 mg/dL — ABNORMAL HIGH (ref 70–99)

## 2017-12-26 MED ORDER — CEFDINIR 300 MG PO CAPS: 300.0000 mg | ORAL_CAPSULE | Freq: Two times a day (BID) | ORAL | Status: AC

## 2017-12-26 MED ORDER — HYDROCODONE-ACETAMINOPHEN 5-325 MG PO TABS: 1.0000 | ORAL_TABLET | Freq: Four times a day (QID) | ORAL | 0 refills | Status: AC | PRN

## 2017-12-26 MED ORDER — FERROUS SULFATE 325 (65 FE) MG PO TABS: 325.0000 mg | ORAL_TABLET | Freq: Two times a day (BID) | ORAL | 3 refills | Status: AC

## 2017-12-26 NOTE — NC FL2 (Signed)
[place] LEVEL OF CARE SCREENING TOOL     IDENTIFICATION  Patient Name: [name] Birthdate: 1917/01/12 Sex: female Admission Date (Current Location): 12/23/2017  [facility] and [place] Number:  Herbalist and Address:  [facility] [place], [place] [address] [place], [place] [address]      Provider Number: [id number]  Attending Physician Name and Address:  Monica Becton, MD  Relative Name and Phone Number:  Glori Luis [phone]    Current Level of Care: Hospital Recommended Level of Care: [place] Prior Approval Number: [id number]  Date Approved/Denied:   PASRR Number:    Discharge Plan: SNF    Current Diagnoses: Patient Active Problem List   Diagnosis Date Noted  . UTI (urinary tract infection) 12/07/2017  . Fracture of femoral neck, left ([place])   . Mobitz type II atrioventricular block   . Chronic anticoagulation 05/24/2015  . Permanent atrial fibrillation ([place]) 05/14/2015  . Chronic diastolic CHF (congestive heart failure) ([place]) 05/14/2015  . Acute respiratory failure ([place]) 05/14/2015  . Elevated troponin 05/14/2015  . Type II diabetes mellitus, well controlled ([place]) 05/14/2015  . Hypothyroid 05/14/2015  . Pacemaker 02/12/2012  . History of colon cancer 03/25/2011  . Edema 02/19/2011  . COLITIS 12/06/2008  . DIARRHEA 11/23/2008  . CAD S/P percutaneous coronary angioplasty 09/28/2008  . VENTRAL HERNIA 09/28/2008  . SYNCOPE 09/28/2008  . Dyslipidemia 08/10/2007  . Essential hypertension 08/10/2007  . INTERNAL HEMORRHOIDS 08/10/2007  . DEGENERATIVE JOINT DISEASE 08/10/2007  . DIVERTICULOSIS OF COLON 02/17/2007    Orientation RESPIRATION BLADDER Height & Weight     Self, Time, Situation, Place  Normal Continent Weight: 171 lb 1.2 oz (77.6 kg) Height:  5\' 3"  (160 cm)  BEHAVIORAL SYMPTOMS/MOOD NEUROLOGICAL BOWEL NUTRITION STATUS      Continent Diet(see Discharge Plan)  AMBULATORY  STATUS COMMUNICATION OF NEEDS Skin   Extensive Assist Verbally Other (Comment)(incision(closed) hip left)                       Personal Care Assistance Level of Assistance  Bathing, Feeding, Dressing Bathing Assistance: Maximum assistance Feeding assistance: Independent Dressing Assistance: Maximum assistance     Functional Limitations Info  Sight, Hearing, Speech Sight Info: Impaired(wears glasses) Hearing Info: Impaired(hard of hearing) Speech Info: Adequate    SPECIAL CARE FACTORS FREQUENCY  PT (By licensed PT), OT (By licensed OT)     PT Frequency: 5x week OT Frequency: 5x week            Contractures Contractures Info: Not present    Additional Factors Info  Code Status, Allergies, Insulin Sliding Scale( ) Code Status Info: DNR Allergies Info: DICLOFENAC SODIUM, DURAGESIC-100 FENTANYL, HYTRIN TERAZOSIN HCL, IBUPROFEN, NORVASC AMLODIPINE BESYLATE, PLAVIX CLOPIDOGREL BISULFATE    Insulin Sliding Scale Info: insulin aspart (novoLOG) injection 0-5 Units   daily at bedtime  insulin aspart (novoLOG) injection 0-9 Units 3x daily with meals       Current Medications (12/26/2017):  This is the current hospital active medication list Current Facility-Administered Medications  Medication Dose Route Frequency Provider Last Rate Last Dose  . acetaminophen (TYLENOL) tablet 325-650 mg  325-650 mg Oral Q6H PRN [name], MD      . alum & mag hydroxide-simeth (MAALOX/MYLANTA) 200-200-20 MG/5ML suspension 30 mL  30 mL Oral Q4H PRN [name], MD      . apixaban Arne Cleveland) tablet 5 mg  5 mg Oral BID [name], MD  5 mg at 12/25/17 2115  . bisacodyl (DULCOLAX) suppository 10 mg  10 mg Rectal Daily PRN [name], MD      . cefdinir (OMNICEF) capsule 300 mg  300 mg Oral Q12H [name], MD   300 mg at 12/26/17 0658  . docusate sodium (COLACE) capsule 100 mg  100 mg Oral BID [name], MD   100 mg at 12/25/17 2116  . escitalopram (LEXAPRO) tablet 5 mg  5  mg Oral Daily [name], MD   5 mg at 12/25/17 0942  . ferrous sulfate tablet 325 mg  325 mg Oral Q12H [name], MD   325 mg at 12/25/17 2115  . ferrous sulfate tablet 325 mg  325 mg Oral TID Emiliano Dyer, MD   325 mg at 12/26/17 2951  . furosemide (LASIX) tablet 40 mg  40 mg Oral Daily [name], MD   40 mg at 12/26/17 0658  . HYDROcodone-acetaminophen (NORCO) 7.5-325 MG per tablet 1-2 tablet  1-2 tablet Oral Q4H PRN [name], MD   1 tablet at 12/25/17 2116  . HYDROcodone-acetaminophen (NORCO/VICODIN) 5-325 MG per tablet 1-2 tablet  1-2 tablet Oral Q4H PRN [name], MD   2 tablet at 12/25/17 1338  . insulin aspart (novoLOG) injection 0-5 Units  0-5 Units Subcutaneous QHS [name], MD   3 Units at 12/26/17 0001  . insulin aspart (novoLOG) injection 0-9 Units  0-9 Units Subcutaneous TID WC [name], MD   2 Units at 12/26/17 [phone]  . levothyroxine (SYNTHROID, LEVOTHROID) tablet 88 mcg  88 mcg Oral QAC breakfast [name], MD   88 mcg at 12/26/17 6606  . losartan (COZAAR) tablet 100 mg  100 mg Oral Daily [name], MD   100 mg at 12/25/17 0942  . magnesium citrate solution 1 Bottle  1 Bottle Oral Once PRN [name], MD      . menthol-cetylpyridinium (CEPACOL) lozenge 3 mg  1 lozenge Oral PRN [name], MD       Or  . phenol (CHLORASEPTIC) mouth spray 1 spray  1 spray Mouth/Throat PRN [name], MD      . metoCLOPramide (REGLAN) tablet 5-10 mg  5-10 mg Oral Q8H PRN [name], MD       Or  . metoCLOPramide (REGLAN) injection 5-10 mg  5-10 mg Intravenous Q8H PRN [name], MD      . morphine 2 MG/ML injection 0.5-1 mg  0.5-1 mg Intravenous Q2H PRN [name], MD   0.5 mg at 12/26/17 0220  . nitroGLYCERIN (NITROSTAT) SL tablet 0.4 mg  0.4 mg Sublingual Q5 min PRN [name], MD      . ondansetron [facility]) tablet 4 mg  4 mg Oral Q6H PRN [name], MD       Or  . ondansetron [facility]) injection 4 mg  4 mg  Intravenous Q6H PRN [name], MD      . polyethylene glycol (MIRALAX / GLYCOLAX) packet 17 g  17 g Oral Daily PRN [name], MD      . potassium chloride SA (K-DUR,KLOR-CON) CR tablet 20 mEq  20 mEq Oral Daily [name], MD   20 mEq at 12/25/17 0942  . simvastatin (ZOCOR) tablet 40 mg  40 mg Oral QHS [name], MD   40 mg at 12/25/17 2116     Discharge Medications: Please see discharge summary for a list of discharge medications.  Relevant Imaging Results:  Relevant Lab Results:   Additional Information SSN [id number]  Alexander Mt, LCSWA

## 2017-12-26 NOTE — Progress Notes (Signed)
Foley cath placed per MD order. Clear yellow urine drained into the foley.

## 2017-12-26 NOTE — Progress Notes (Signed)
Paged MD on call about [name] not voiding since being bladder scan earlier today. Bladder scan showed 496cc.

## 2017-12-26 NOTE — Progress Notes (Signed)
Report given to Stacie Glaze, RN [facility]. Reported that patient having a hard time voiding and will go In and out cath prior to discharge if patient unable to urinate. Reported to RN that Md order to continue to do In and out cath as needed even in the facility.

## 2017-12-26 NOTE — Social Work (Signed)
Clinical Social Worker facilitated patient discharge including contacting patient family and facility to confirm patient discharge plans.  Clinical information faxed to facility and family agreeable with plan.  CSW will arrange ambulance transport via [facility] to [place] when pt has voided.   RN to call [phone] with report  prior to discharge.  Clinical Social Worker will sign off for now as social work intervention is no longer needed. Please consult [name] again if new need arises.  Alexander Mt, [place] Social Worker [phone]

## 2017-12-26 NOTE — Clinical Social Work Placement (Signed)
   CLINICAL SOCIAL WORK PLACEMENT  NOTE [facility] RN to call report to [phone]  Date:  12/26/2017  Patient Details  Name: [name] MRN: [id number] Date of Birth: 10/01/1916  Clinical Social Work is seeking post-discharge placement for this patient at the [place] level of care (*CSW will initial, date and re-position this form in  chart as items are completed):  Yes   Patient/family provided with [place] Work Department's list of facilities offering this level of care within the geographic area requested by the patient (or if unable, by the patient's family).  Yes   Patient/family informed of their freedom to choose among providers that offer the needed level of care, that participate in Medicare, Medicaid or managed care program needed by the patient, have an available bed and are willing to accept the patient.  Yes   Patient/family informed of [place]'s ownership interest in [facility] and [facility], as well as of the fact that they are under no obligation to receive care at these facilities.  PASRR submitted to EDS on       PASRR number received on       Existing PASRR number confirmed on 12/24/17     FL2 transmitted to all facilities in geographic area requested by pt/family on 12/24/17     FL2 transmitted to all facilities within larger geographic area on       Patient informed that his/her managed care company has contracts with or will negotiate with certain facilities, including the following:        Yes   Patient/family informed of bed offers received.  Patient chooses bed at [place], [place]     Physician recommends and patient chooses bed at      Patient to be transferred to [facility] on 12/26/17.  Patient to be transferred to facility by [facility]     Patient family notified on 12/26/17 of transfer.  Name of family member notified:  neice Jiles Garter at [facility] Please sign DNR     Additional Comment:    _______________________________________________ Alexander Mt, [place] 12/26/2017, 12:11 PM

## 2017-12-26 NOTE — Progress Notes (Signed)
Discharge to [place] by [facility].

## 2017-12-26 NOTE — Progress Notes (Signed)
In and out cath done. Urine output 546ml. [facility] called for transport.

## 2017-12-26 NOTE — Social Work (Addendum)
CSW called [employer] now that PT notes are received, they will process authorization request.   9:16am- authorization received for pt, approved for 7 days initially under auth# [id number] , [place] with [employer] requesting to be informed when pt discharging.  Alexander Mt, [place] Work [phone]

## 2017-12-26 NOTE — Discharge Summary (Signed)
Physician Discharge Summary  [name] BFX:[id number] DOB: March 23, 1917 DOA: 12/23/2017  PCP: [name] Cha, MD  Admit date: 12/23/2017 Discharge date: 12/26/2017  Time spent: 40 minutes  Recommendations for Outpatient Follow-up:  1. Follow-up with Dr. [name], MD in 2 weeks 2. Follow-up with primary care physician in 2 week    Discharge Diagnoses:  Principal Problem:   Fracture of femoral neck, left (HCC) Active Problems:   Dyslipidemia   Essential hypertension   CAD S/P percutaneous coronary angioplasty   Edema   Pacemaker   Permanent atrial fibrillation (HCC)   Chronic diastolic CHF (congestive heart failure) (HCC)   Type II diabetes mellitus, well controlled ([name])   Hypothyroid   Discharge Condition: Stable  Diet recommendation: Cardiac  Filed Weights   12/23/17 1700  Weight: 77.6 kg    History of present illness and Hospital Course:  84 year old female with history of hypothyroidism, hypertension, diabetes mellitus, colon cancer, CAD S/B PTCA, history of Mobitz type II S/P pacemaker placement, permanent atrial fibrillation on Eliquis had nondisplaced left hip fracture about 2 weeks back underwent cannulated hip pinning on 12/07/2017.  Since then patient continued to have worsening of the pain in the left hip, progressive dysfunction of the limp.  Patient was also noted to have increasing bilateral lower extremity edema concerning this patient was brought back to the emergency department.  Consulted orthopedic surgery plan to do open reduction and internal fixation of the left hip.  Per family patient is well functional at baseline walks around the house.  Underwent open reduction and internal fixation of left femoral neck fracture.  Evaluated by physical therapy, recommended skilled nursing facility placement.  Patient developed urinary retention after the surgery.  Continue to monitor with the bladder scan for any signs of urinary retention.  Most likely  secondary to narcotics and anesthesia.  Patient had acute blood loss anemia hemoglobin trended down from 11-9.4, remained stable.  Patient will continue with p.o. iron.  Patient was diagnosed with a urinary tract infection and was treated with the ciprofloxacin later changed to Cefdinir.  Patient will need 2 more days of antibiotics.  Patient has history of permanent atrial fibrillation, continue with metoprolol, Eliquis for chronic anticoagulation.  If patient is at increased risk of falls strongly consider holding Eliquis at the time of the discharge from SNF.  Patient had mild hyponatremia of 132, however normalized.  Patient will need to follow-up with orthopedic surgery in 2 weeks.   Assessment & Plan:   Principal Problem:   Fracture of femoral neck, left (HCC) Active Problems:   Dyslipidemia   Essential hypertension   CAD S/P percutaneous coronary angioplasty   Edema   Pacemaker   Permanent atrial fibrillation (HCC)   Chronic diastolic CHF (congestive heart failure) (HCC)   Type II diabetes mellitus, well controlled (HCC)   Hypothyroid   ##Left femoral neck fracture -Failure  -Underwent open reduction and internal fixation today -Continue with pain management as needed  -evaluated by PT, recommended SNF -Line to discharge patient to SNF tomorrow  ##Acute blood loss anemia -Follow-up with CBC  ##Hyponatremia mild -SIADH from pain -Follow-up with the BMP a.m.  ##Bilateral lower extremity edema -Patient has preserved left ventricular function with EF of 50 to 55% -Concern about venous stasis edema -Continue to follow up with elevating lower extremities  ##Hypertension -Currently well controlled   ##Atrial fibrillation permanent -Currently rate is controlled -Hold Eliquis for the surgery  ##Diabetes mellitus with hyperglycemia  -keep the patient on  diabetic diet -Hemoglobin A1c was 7.1 on 12/07/2088 -Continue on sliding scale  insulin  ##Hypothyroidism -Continue with Synthroid  ##UTI - Keep on cefdinir for total of 7 days  Procedures:  Open reduction and internal fixation of the left hip   Consultations:  Orthopedic surgery  Discharge Exam: Vitals:   12/25/17 2146 12/26/17 0525  BP: (!) 105/47 136/65  Pulse: 60 66  Resp: 17 19  Temp: 98.3 F (36.8 C) (!) 97.4 F (36.3 C)  SpO2: 97% 100%    General exam: Appears calm and comfortable  Respiratory system: Clear to auscultation. Respiratory effort normal. Cardiovascular system: S1 & S2 heard, RRR. No JVD, murmurs, rubs, gallops or clicks. No pedal edema. Gastrointestinal system: Abdomen is nondistended, soft and nontender. No organomegaly or masses felt. Normal bowel sounds heard. Central nervous system: Alert and oriented. No focal neurological deficits. Extremities: Left lower extremity tenderness to palpation, range of motion,in dressing. skin: No rashes, lesions or ulcers Psychiatry: Judgement and insight appear normal. Mood & affect appropriate. Discharge Instructions   Discharge Instructions    Diet - low sodium heart healthy   Complete by:  As directed    Increase activity slowly   Complete by:  As directed    Weight bearing as tolerated   Complete by:  As directed      Allergies as of 12/26/2017      Reactions   Diclofenac Sodium Other (See Comments)   Noted to be "allergic," per [facility]   Duragesic-100 [fentanyl] Other (See Comments)   Noted to be "allergic," per [facility]   Hytrin [terazosin Hcl] Other (See Comments)   Noted to be "allergic," per [facility]   Ibuprofen Other (See Comments)   Noted to be "allergic," per [facility]   Norvasc [amlodipine Besylate] Other (See Comments)   Noted to be "allergic," per [facility]   Plavix [clopidogrel Bisulfate] Other (See Comments)   Noted to be "allergic," per [facility]      Medication List    STOP taking these medications   ciprofloxacin 250 MG tablet Commonly known as:  CIPRO   losartan 100 MG  tablet Commonly known as:  COZAAR   MILK OF MAGNESIA 400 MG/5ML suspension Generic drug:  magnesium hydroxide   nitroGLYCERIN 0.4 MG SL tablet Commonly known as:  NITROSTAT     TAKE these medications   acetaminophen 325 MG tablet Commonly known as:  TYLENOL Take 2 tablets (650 mg total) by mouth every 6 (six) hours as needed.   cefdinir 300 MG capsule Commonly known as:  OMNICEF Take 1 capsule (300 mg total) by mouth every 12 (twelve) hours. 2 more days   ELIQUIS 5 MG Tabs tablet Generic drug:  apixaban TAKE 1 TABLET BY MOUTH TWICE DAILY   escitalopram 5 MG tablet Commonly known as:  LEXAPRO Take 5 mg by mouth daily.   ferrous sulfate 325 (65 FE) MG tablet Take 1 tablet (325 mg total) by mouth every 12 (twelve) hours. What changed:  when to take this   furosemide 40 MG tablet Commonly known as:  LASIX Take 1 tablet (40 mg total) by mouth daily.   HYDROcodone-acetaminophen 5-325 MG tablet Commonly known as:  NORCO/VICODIN Take 1 tablet by mouth every 6 (six) hours as needed for up to 5 days for severe pain (pain score 4-6). What changed:    how much to take  reasons to take this   levothyroxine 88 MCG tablet Commonly known as:  SYNTHROID, LEVOTHROID Take 88 mcg by mouth daily.  polyethylene glycol packet Commonly known as:  MIRALAX / GLYCOLAX Take 17 g by mouth daily.   potassium chloride SA 20 MEQ tablet Commonly known as:  K-DUR,KLOR-CON Take 1 tablet (20 mEq total) by mouth daily.   SENEXON-S 8.6-50 MG tablet Generic drug:  senna-docusate Take 1 tablet by mouth daily.   simvastatin 40 MG tablet Commonly known as:  ZOCOR Take 1 tablet (40 mg total) by mouth at bedtime.            Discharge Care Instructions  (From admission, onward)         Start     Ordered   12/24/17 0000  Weight bearing as tolerated     12/24/17 1658         Allergies  Allergen Reactions  . Diclofenac Sodium Other (See Comments)    Noted to be "allergic," per  [facility]  . Duragesic-100 [Fentanyl] Other (See Comments)    Noted to be "allergic," per [facility]  . Hytrin [Terazosin Hcl] Other (See Comments)    Noted to be "allergic," per [facility]   . Ibuprofen Other (See Comments)    Noted to be "allergic," per [facility]  . Norvasc [Amlodipine Besylate] Other (See Comments)    Noted to be "allergic," per [facility]  . Plavix [Clopidogrel Bisulfate] Other (See Comments)    Noted to be "allergic," per [facility]    Follow-up Information    [name], MD. Schedule an appointment as soon as possible for a visit in 2 weeks.   Specialty:  Orthopedic Surgery Contact information: [address] [place] [address] [phone]            The results of significant diagnostics from this hospitalization (including imaging, microbiology, ancillary and laboratory) are listed below for reference.    Significant Diagnostic Studies: Dg Chest 1 View  Result Date: 12/07/2017 CLINICAL DATA:  Patient status post fall today. EXAM: CHEST  1 VIEW COMPARISON:  PA and lateral chest 05/14/2015 and 04/25/2009. FINDINGS: Two lead pacing device in place. There is cardiomegaly without edema. Aortic atherosclerosis is noted. No pneumothorax or pleural fluid. No acute or focal bony abnormality. IMPRESSION: No acute disease. Cardiomegaly. Atherosclerosis. Electronically Signed   By: Inge Rise M.D.   On: 12/07/2017 11:43   Dg Chest 2 View  Result Date: 12/23/2017 CLINICAL DATA:  Recent hip fracture.  Atrial fibrillation EXAM: CHEST - 2 VIEW COMPARISON:  December 07, 2017 FINDINGS: There is a small pleural effusion on each side. There is slight bibasilar atelectasis. There is no edema or consolidation. Heart is mildly enlarged with pulmonary vascularity normal. Pacemaker leads are attached to the right atrium and right ventricle. There is aortic atherosclerosis. No adenopathy. Bones are osteoporotic. IMPRESSION: Small bilateral pleural effusions with bibasilar atelectasis. No  consolidation. Heart mildly enlarged with pulmonary vascularity normal. Pacemaker leads attached to right atrium and right ventricle. There is aortic atherosclerosis. Bones osteoporotic. Aortic Atherosclerosis (ICD10-I70.0). Electronically Signed   By: Lowella Grip III M.D.   On: 12/23/2017 14:01   Dg Pelvis 1-2 Views  Result Date: 12/07/2017 CLINICAL DATA:  Pain due to a fall today.  Initial encounter. EXAM: PELVIS - 1-2 VIEW COMPARISON:  None. FINDINGS: The patient has a nondisplaced fracture through approximately the mid aspect of the left femoral neck. No other acute bony or joint abnormality is identified. Mesh from hernia repair and atherosclerotic vascular disease are noted. IMPRESSION: Acute nondisplaced fracture mid aspect of the left femoral neck. Electronically Signed   By:  Inge Rise M.D.   On: 12/07/2017 11:44   Ct Head Wo Contrast  Result Date: 12/07/2017 CLINICAL DATA:  84 year old female with unwitnessed fall this morning. Unsure if loss of consciousness. Denies head and neck pain. Initial encounter. EXAM: CT HEAD WITHOUT CONTRAST CT CERVICAL SPINE WITHOUT CONTRAST TECHNIQUE: Multidetector CT imaging of the head and cervical spine was performed following the standard protocol without intravenous contrast. Multiplanar CT image reconstructions of the cervical spine were also generated. COMPARISON:  None. 04/18/2009 head CT. FINDINGS: CT brain findings. Brain: No intracranial hemorrhage or CT evidence of large acute infarct. Chronic microvascular changes. Moderate global atrophy. No intracranial mass lesion noted on this unenhanced exam. Vascular: Vascular calcifications Skull: No skull fracture. Remote right convexity craniotomy. Sinuses/Orbits: No acute orbital abnormality. Complete opacification left sphenoid sinus. Mucosal thickening right sphenoid sinus. Extensive thickening of bony landmarks of the sinuses and extensive prior sinus surgery. Moderate mucosal thickening  surrounding ethmoid sinus region and maxillary sinuses. Other: Mastoid air cells and middle ear cavities are clear. CT CERVICAL SPINE FINDINGS Alignment: Mild curvature convex left with slight head tilt. Skull base and vertebrae: No cervical spine fracture. Soft tissues and spinal canal: No abnormal prevertebral soft tissue swelling. Disc levels: Cervical spondylotic changes without high-grade spinal stenosis. This includes degenerative changes C1-2 articulation greater on the left and facet degenerative changes bilaterally C3-4 and C4-5 level. Partial fusion C3-4 and dens with basion. Upper chest: No worrisome abnormality. Other: 2 x 2.5 cm left lobe with thyroid gland nodule. This can be followed by thyroid ultrasound if clinically desired. IMPRESSION: 1. No skull fracture or intracranial hemorrhage. 2. No cervical spine fracture or abnormal prevertebral soft tissue swelling. Degenerative changes throughout cervical spine. Mild curvature convex left with slight head tilt. 3. Extensive prior sinus surgery with changes of chronic sinusitis. 4. 2 x 2.5 cm left lobe thyroid nodule. This can be followed by thyroid ultrasound if clinically desired. 5. Chronic microvascular changes.  Atrophy. Electronically Signed   By: Genia Del M.D.   On: 12/07/2017 11:40   Ct Cervical Spine Wo Contrast  Result Date: 12/07/2017 CLINICAL DATA:  84 year old female with unwitnessed fall this morning. Unsure if loss of consciousness. Denies head and neck pain. Initial encounter. EXAM: CT HEAD WITHOUT CONTRAST CT CERVICAL SPINE WITHOUT CONTRAST TECHNIQUE: Multidetector CT imaging of the head and cervical spine was performed following the standard protocol without intravenous contrast. Multiplanar CT image reconstructions of the cervical spine were also generated. COMPARISON:  None. 04/18/2009 head CT. FINDINGS: CT brain findings. Brain: No intracranial hemorrhage or CT evidence of large acute infarct. Chronic microvascular changes.  Moderate global atrophy. No intracranial mass lesion noted on this unenhanced exam. Vascular: Vascular calcifications Skull: No skull fracture. Remote right convexity craniotomy. Sinuses/Orbits: No acute orbital abnormality. Complete opacification left sphenoid sinus. Mucosal thickening right sphenoid sinus. Extensive thickening of bony landmarks of the sinuses and extensive prior sinus surgery. Moderate mucosal thickening surrounding ethmoid sinus region and maxillary sinuses. Other: Mastoid air cells and middle ear cavities are clear. CT CERVICAL SPINE FINDINGS Alignment: Mild curvature convex left with slight head tilt. Skull base and vertebrae: No cervical spine fracture. Soft tissues and spinal canal: No abnormal prevertebral soft tissue swelling. Disc levels: Cervical spondylotic changes without high-grade spinal stenosis. This includes degenerative changes C1-2 articulation greater on the left and facet degenerative changes bilaterally C3-4 and C4-5 level. Partial fusion C3-4 and dens with basion. Upper chest: No worrisome abnormality. Other: 2 x 2.5 cm left lobe with  thyroid gland nodule. This can be followed by thyroid ultrasound if clinically desired. IMPRESSION: 1. No skull fracture or intracranial hemorrhage. 2. No cervical spine fracture or abnormal prevertebral soft tissue swelling. Degenerative changes throughout cervical spine. Mild curvature convex left with slight head tilt. 3. Extensive prior sinus surgery with changes of chronic sinusitis. 4. 2 x 2.5 cm left lobe thyroid nodule. This can be followed by thyroid ultrasound if clinically desired. 5. Chronic microvascular changes.  Atrophy. Electronically Signed   By: Genia Del M.D.   On: 12/07/2017 11:40   Pelvis Portable  Result Date: 12/07/2017 CLINICAL DATA:  LEFT hip fracture. EXAM: OPERATIVE LEFT HIP (WITH PELVIS IF PERFORMED) 2 VIEWS TECHNIQUE: Fluoroscopic spot image(s) were submitted for interpretation post-operatively. COMPARISON:   Preoperative pelvic radiograph earlier today FINDINGS: Three cannulated screws have been placed across a nondisplaced fracture of the mid aspect of the LEFT femoral neck. IMPRESSION: Improved position and alignment.  No adverse features. Electronically Signed   By: Staci Righter M.D.   On: 12/07/2017 19:40   Dg Knee Complete 4 Views Left  Result Date: 12/07/2017 CLINICAL DATA:  Left knee pain.  Closed left hip fracture EXAM: LEFT KNEE - COMPLETE 4+ VIEW COMPARISON:  06/13/2016 FINDINGS: No evidence of fracture, dislocation, or joint effusion. Tricompartmental osteoarthritis with marginal spurring and narrowing that is generalized. Patellofemoral degeneration is the greatest. Osteopenia and atherosclerosis. IMPRESSION: 1. No acute finding. 2. Osteoarthritis and atherosclerosis. Electronically Signed   By: Monte Fantasia M.D.   On: 12/07/2017 14:49   Dg C-arm 1-60 Min  Result Date: 12/07/2017 CLINICAL DATA:  LEFT hip fracture. EXAM: OPERATIVE LEFT HIP (WITH PELVIS IF PERFORMED) 2 VIEWS TECHNIQUE: Fluoroscopic spot image(s) were submitted for interpretation post-operatively. COMPARISON:  Preoperative pelvic radiograph earlier today FINDINGS: Three cannulated screws have been placed across a nondisplaced fracture of the mid aspect of the LEFT femoral neck. IMPRESSION: Improved position and alignment.  No adverse features. Electronically Signed   By: Staci Righter M.D.   On: 12/07/2017 19:40   Dg Hip Port Unilat With Pelvis 1v Left  Result Date: 12/24/2017 CLINICAL DATA:  Status post hip surgery EXAM: DG HIP (WITH OR WITHOUT PELVIS) 1V PORT LEFT COMPARISON:  12/07/2017 FINDINGS: Prior hernia repair. Limited evaluation of right femoral neck due to superimposition of trochanter. Pubic symphysis and rami are intact. Interval left hip replacement with normal alignment and no obvious fracture. Gas within the soft tissues lateral to the hip. Vascular calcification IMPRESSION: Interval left hip replacement with  expected postsurgical changes. Electronically Signed   By: Donavan Foil M.D.   On: 12/24/2017 18:08   Dg Hip Operative Unilat W Or W/o Pelvis Left  Result Date: 12/07/2017 CLINICAL DATA:  LEFT hip fracture. EXAM: OPERATIVE LEFT HIP (WITH PELVIS IF PERFORMED) 2 VIEWS TECHNIQUE: Fluoroscopic spot image(s) were submitted for interpretation post-operatively. COMPARISON:  Preoperative pelvic radiograph earlier today FINDINGS: Three cannulated screws have been placed across a nondisplaced fracture of the mid aspect of the LEFT femoral neck. IMPRESSION: Improved position and alignment.  No adverse features. Electronically Signed   By: Staci Righter M.D.   On: 12/07/2017 19:40    Microbiology: Recent Results (from the past 240 hour(s))  Surgical pcr screen     Status: None   Collection Time: 12/23/17  8:08 PM  Result Value Ref Range Status   MRSA, PCR NEGATIVE NEGATIVE Final   Staphylococcus aureus NEGATIVE NEGATIVE Final    Comment: (NOTE) The Xpert SA Assay (FDA approved for NASAL specimens  in patients 68 years of age and older), is one component of a comprehensive surveillance program. It is not intended to diagnose infection nor to guide or monitor treatment. Performed at [facility], [place] [address], [place], [place] [address]      Labs: Basic Metabolic Panel: Recent Labs  Lab 12/23/17 1332 12/24/17 0440 12/25/17 0404 12/26/17 0414  NA 137 137 132* 132*  K 3.9 3.6 4.3 4.2  CL 103 104 101 98  CO2 24 26 23 23   GLUCOSE 246* 144* 450* 168*  BUN 12 11 16 15   CREATININE 0.88 0.94 1.12* 1.11*  CALCIUM 9.0 8.3* 7.8* 8.4*   Liver Function Tests: Recent Labs  Lab 12/23/17 1332  AST 20  ALT 14  ALKPHOS 117  BILITOT 1.0  PROT 7.1  ALBUMIN 3.1*   No results for input(s): LIPASE, AMYLASE in the last 168 hours. No results for input(s): AMMONIA in the last 168 hours. CBC: Recent Labs  Lab 12/23/17 1332 12/24/17 0440 12/25/17 0404 12/25/17 1537 12/26/17 0414  WBC 9.9  8.1 9.7 12.8* 11.2*  NEUTROABS 7.0  --   --   --   --   HGB 11.8* 10.7* 9.3* 10.0* 9.4*  HCT 35.9* 33.4* 29.0* 30.5* 29.1*  MCV 95.2 96.8 96.7 96.5 97.3  PLT 437* 425* 385 405* 325   Cardiac Enzymes: Recent Labs  Lab 12/23/17 1332  TROPONINI <0.03   BNP: BNP (last 3 results) Recent Labs    12/23/17 1332  BNP 206.0*    ProBNP (last 3 results) No results for input(s): PROBNP in the last 8760 hours.  CBG: Recent Labs  Lab 12/25/17 0736 12/25/17 1200 12/25/17 1644 12/25/17 2143 12/26/17 0803  GLUCAP 372* 254* 163* 258* 166*       Signed:  Kaisey Huseby MD.  [facility] 12/26/2017, 9:32 AM

## 2017-12-27 DIAGNOSIS — I480 Paroxysmal atrial fibrillation: Secondary | ICD-10-CM | POA: Diagnosis not present

## 2017-12-27 DIAGNOSIS — Z96642 Presence of left artificial hip joint: Secondary | ICD-10-CM | POA: Diagnosis not present

## 2017-12-27 DIAGNOSIS — I10 Essential (primary) hypertension: Secondary | ICD-10-CM | POA: Diagnosis not present

## 2017-12-27 DIAGNOSIS — E7849 Other hyperlipidemia: Secondary | ICD-10-CM | POA: Diagnosis not present

## 2017-12-27 DIAGNOSIS — Z471 Aftercare following joint replacement surgery: Secondary | ICD-10-CM | POA: Diagnosis not present

## 2017-12-27 DIAGNOSIS — L89622 Pressure ulcer of left heel, stage 2: Secondary | ICD-10-CM | POA: Diagnosis not present

## 2017-12-27 DIAGNOSIS — Z7901 Long term (current) use of anticoagulants: Secondary | ICD-10-CM | POA: Diagnosis not present

## 2017-12-29 DIAGNOSIS — L89622 Pressure ulcer of left heel, stage 2: Secondary | ICD-10-CM | POA: Diagnosis not present

## 2017-12-30 ENCOUNTER — Encounter: Payer: Self-pay | Admitting: Cardiology

## 2018-01-05 DIAGNOSIS — L89622 Pressure ulcer of left heel, stage 2: Secondary | ICD-10-CM | POA: Diagnosis not present

## 2018-01-07 ENCOUNTER — Other Ambulatory Visit: Payer: Self-pay | Admitting: *Deleted

## 2018-01-07 NOTE — Patient Outreach (Signed)
[name]  01/07/2018  [name] 1916-10-01 [id number]     Collaboration with [facility] UM Team member Jari Pigg about paitent's stay at [facility].  Patient has been improving this week in PT and actively participating in care.  Patient plans to be at post acute facility through next week.  Plan to visit with patient next week during facility site visit.  Rutherford Limerick RN, BSN [place] Acute Care Coordinator [phone]) Business Mobile [phone]) Toll free office

## 2018-01-12 DIAGNOSIS — L89622 Pressure ulcer of left heel, stage 2: Secondary | ICD-10-CM | POA: Diagnosis not present

## 2018-01-13 DIAGNOSIS — S72002D Fracture of unspecified part of neck of left femur, subsequent encounter for closed fracture with routine healing: Secondary | ICD-10-CM | POA: Diagnosis not present

## 2018-01-15 ENCOUNTER — Other Ambulatory Visit: Payer: Self-pay | Admitting: *Deleted

## 2018-01-15 NOTE — Patient Outreach (Signed)
[facility]) Care Management  01/15/2018  [name] Apr 27, 1917 [id number]   Co-visit to [place] with [facility] UM team member [place].   Attended facility discharge planning meeting regarding patient in which nursing, PT, d/c planner and administration were present.  D/C planner discussed patient's present plans are to go home where she lives alone at discharge.  PT stated patient was progressing and participating with therapies walking 14fet with her walker.  No discharge date planned at this time.   Met with patient at the bedside.  Patient sitting in wheelchair and had just returned from PT.  Patient stated she plans to discharge home. She stated she has a lady who comes in and stays with her in the morning.  Patient stated her nieces assist her handle her care. ES[place] BeachManagement services and gave patient packet to share with nieces.  Patient agreed to TNorth Madisonservices.  Patient stated she would appreciate if I would call her niece HBonnita Nasutiand discuss TE[facility]ro[facility]CM services with her also.   Placed a phone call to patient's niece HJiles Garterto explain TGastro[facility]CCM services.  Niece stated patient will need more assistance when she gets home.  She stated the lady who stays with her in the am is there only a short while. Niece stated at patient's last MD appointment more PT was recommended.   Niece interested in speaking with [facility]St [facility] HospitalLCSW about community resources who provide caregivers.  Niece stated patient does have meals on wheels set up.  Made niece aware patient agreed to services and would be enrolled in a transition of care program with a TTrexlertownnurse.   Referral sent for TDeFuniak SpringsRN and THar[facility] LLCLCSW to follow up t SNF discharge. Plan to monitor for discharge date to relay to TSunland Parkteam members.   For questions please contact:  Rachid Parham RN, BRoslynAcute  Care Coordinator [phone] Business Mobile ([phone] Toll free office

## 2018-01-18 ENCOUNTER — Other Ambulatory Visit: Payer: Self-pay

## 2018-01-18 NOTE — Patient Outreach (Addendum)
[facility]) Care Management  01/18/2018  [name] 11-17-1916 [id number]   First outreach regarding social work referral for assistance with personal care services upon discharge from SNF.  BSW left voicemail message on mobile number that is listed for both [name] and her niece, [name].  BSW will attempt to contact again within four business days.    Addendum;  BSW received return call from patient's niece, [name].  [name] reported that an appeal was filed regarding payment for [name]'s continued stay at current SNF so there is no set discharge date.  Per [name], [name] wishes to return home after discharge but her family is not sure that this is the safest option.  If the decision is made for long term placement, the family prefers that she transition to [facility] because it is the best geographic location for the family members that assist in her care.  Per [name], [name] would prefer to transition to [facility].  She acknowledged that the family does not have the financial means to afford either of these options for an extended period of time.  The family has discussed applying for Medicaid but she acknowledged [name] would have to "spend down" in order to qualify.   At this time, [name] has requested that  BSW  assist with gathering cost information for [facility] and [employer]  BSW will gather this information and follow up next week.     Ronn Melena, BSW Social Worker [phone]

## 2018-01-19 DIAGNOSIS — L89622 Pressure ulcer of left heel, stage 2: Secondary | ICD-10-CM | POA: Diagnosis not present

## 2018-01-20 ENCOUNTER — Other Ambulatory Visit: Payer: Self-pay | Admitting: *Deleted

## 2018-01-20 NOTE — Patient Outreach (Signed)
[facility]) Care Management  01/20/2018  [name] 08-13-1916 [id number]    Received a call from patient's niece [name]. Patient had given verbal permission to speak with either of her nieces regarding her care.  Niece stated she felt patient was not quite ready to go home because she could not independently get herself to the bathroom.  She stated her aunt really prefers to go home if at all possible. [facility] services to [facility] and made her aware Ms Pak had accepted services. [name] stated she and her cousin [name] participate in arranging their aunt's care and to please call one or the other if any needs arise.   For any questions please contact:  [name] Hanselman RN, [place] Acute Care Coordinator [phone]) Business Mobile [phone]) Toll free office

## 2018-01-21 ENCOUNTER — Ambulatory Visit: Payer: PPO | Admitting: Cardiology

## 2018-01-21 ENCOUNTER — Ambulatory Visit: Payer: PPO

## 2018-01-25 ENCOUNTER — Other Ambulatory Visit: Payer: Self-pay

## 2018-01-25 NOTE — Patient Outreach (Signed)
[facility]) Care Management  01/25/2018  [name] [birth year] [id number]   Follow up call to patient's niece, Jiles Garter.  [name] reported that she filed another appeal regarding payment for continued stay at [facility] and an extension was granted.  No end date for this extension was provided to her.  She and other family members feels that long-term placement is the best option for [name] unless she starts to show "significant improvement".  There is concern for her safety if she returns home because the family feels that she would need 24 hour care and there is no one that is able to provide this.  As requested, BSW provided her with cost information gathered for placement at [facility].  BSW informed her that a return call has not been received from [facility] regarding cost.  BSW informed [name] that a referral will be placed to an LCSW due to the family seeking long term placement.  Order has been placed for LCSW and BSW is signing off at this time.    Ronn Melena, BSW Social Worker [phone]

## 2018-01-26 DIAGNOSIS — L89622 Pressure ulcer of left heel, stage 2: Secondary | ICD-10-CM | POA: Diagnosis not present

## 2018-01-28 ENCOUNTER — Other Ambulatory Visit: Payer: Self-pay | Admitting: *Deleted

## 2018-01-28 ENCOUNTER — Ambulatory Visit: Payer: Self-pay | Admitting: *Deleted

## 2018-01-28 NOTE — Patient Outreach (Signed)
[facility]) Care Management  01/28/2018  [name] [birth year] [id number]   Attended discharge planning meeting at [facility] at [facility]. [facility] UM Members Manuela Schwartz and Jari Pigg present. Patient discharge plan discussed. Discharge planner reports [name] will be transitioning to [facility] assisted living or custodial care.   No further [facility] CM needs identified.   Patient has been closed to [facility] CM at this time.   Rutherford Limerick RN, BSN [place] Acute Care Coordinator [phone]) Business Mobile [phone]) Toll free office

## 2018-01-29 ENCOUNTER — Encounter: Payer: Self-pay | Admitting: Internal Medicine

## 2018-02-01 DIAGNOSIS — L89622 Pressure ulcer of left heel, stage 2: Secondary | ICD-10-CM | POA: Diagnosis not present

## 2018-02-09 DIAGNOSIS — L89622 Pressure ulcer of left heel, stage 2: Secondary | ICD-10-CM | POA: Diagnosis not present

## 2018-02-09 DIAGNOSIS — L89623 Pressure ulcer of left heel, stage 3: Secondary | ICD-10-CM | POA: Diagnosis not present

## 2018-02-16 DIAGNOSIS — L89622 Pressure ulcer of left heel, stage 2: Secondary | ICD-10-CM | POA: Diagnosis not present

## 2018-02-16 DIAGNOSIS — L89623 Pressure ulcer of left heel, stage 3: Secondary | ICD-10-CM | POA: Diagnosis not present

## 2018-02-23 DIAGNOSIS — I4821 Permanent atrial fibrillation: Secondary | ICD-10-CM | POA: Diagnosis not present

## 2018-02-23 DIAGNOSIS — T84031D Mechanical loosening of internal left hip prosthetic joint, subsequent encounter: Secondary | ICD-10-CM | POA: Diagnosis not present

## 2018-02-23 DIAGNOSIS — L89623 Pressure ulcer of left heel, stage 3: Secondary | ICD-10-CM | POA: Diagnosis not present

## 2018-02-23 DIAGNOSIS — I110 Hypertensive heart disease with heart failure: Secondary | ICD-10-CM | POA: Diagnosis not present

## 2018-02-23 DIAGNOSIS — I5032 Chronic diastolic (congestive) heart failure: Secondary | ICD-10-CM | POA: Diagnosis not present

## 2018-02-23 DIAGNOSIS — L89622 Pressure ulcer of left heel, stage 2: Secondary | ICD-10-CM | POA: Diagnosis not present

## 2018-02-24 DIAGNOSIS — R05 Cough: Secondary | ICD-10-CM | POA: Diagnosis not present

## 2018-02-24 DIAGNOSIS — E1165 Type 2 diabetes mellitus with hyperglycemia: Secondary | ICD-10-CM | POA: Diagnosis not present

## 2018-02-24 DIAGNOSIS — E039 Hypothyroidism, unspecified: Secondary | ICD-10-CM | POA: Diagnosis not present

## 2018-02-24 DIAGNOSIS — M810 Age-related osteoporosis without current pathological fracture: Secondary | ICD-10-CM | POA: Diagnosis not present

## 2018-02-24 DIAGNOSIS — I5032 Chronic diastolic (congestive) heart failure: Secondary | ICD-10-CM | POA: Diagnosis not present

## 2018-02-24 DIAGNOSIS — E785 Hyperlipidemia, unspecified: Secondary | ICD-10-CM | POA: Diagnosis not present

## 2018-02-24 DIAGNOSIS — I252 Old myocardial infarction: Secondary | ICD-10-CM | POA: Diagnosis not present

## 2018-02-24 DIAGNOSIS — D649 Anemia, unspecified: Secondary | ICD-10-CM | POA: Diagnosis not present

## 2018-02-24 DIAGNOSIS — E781 Pure hyperglyceridemia: Secondary | ICD-10-CM | POA: Diagnosis not present

## 2018-02-24 DIAGNOSIS — I10 Essential (primary) hypertension: Secondary | ICD-10-CM | POA: Diagnosis not present

## 2018-02-24 DIAGNOSIS — M1712 Unilateral primary osteoarthritis, left knee: Secondary | ICD-10-CM | POA: Diagnosis not present

## 2018-02-24 DIAGNOSIS — I4891 Unspecified atrial fibrillation: Secondary | ICD-10-CM | POA: Diagnosis not present

## 2018-02-24 DIAGNOSIS — F329 Major depressive disorder, single episode, unspecified: Secondary | ICD-10-CM | POA: Diagnosis not present

## 2018-03-01 DIAGNOSIS — I5032 Chronic diastolic (congestive) heart failure: Secondary | ICD-10-CM | POA: Diagnosis not present

## 2018-03-01 DIAGNOSIS — Z79899 Other long term (current) drug therapy: Secondary | ICD-10-CM | POA: Diagnosis not present

## 2018-03-01 DIAGNOSIS — I4821 Permanent atrial fibrillation: Secondary | ICD-10-CM | POA: Diagnosis not present

## 2018-03-01 DIAGNOSIS — T84031D Mechanical loosening of internal left hip prosthetic joint, subsequent encounter: Secondary | ICD-10-CM | POA: Diagnosis not present

## 2018-03-01 DIAGNOSIS — R0602 Shortness of breath: Secondary | ICD-10-CM | POA: Diagnosis not present

## 2018-03-01 DIAGNOSIS — R6889 Other general symptoms and signs: Secondary | ICD-10-CM | POA: Diagnosis not present

## 2018-03-01 DIAGNOSIS — L89623 Pressure ulcer of left heel, stage 3: Secondary | ICD-10-CM | POA: Diagnosis not present

## 2018-03-01 DIAGNOSIS — I110 Hypertensive heart disease with heart failure: Secondary | ICD-10-CM | POA: Diagnosis not present

## 2018-03-02 DIAGNOSIS — L89622 Pressure ulcer of left heel, stage 2: Secondary | ICD-10-CM | POA: Diagnosis not present

## 2018-03-02 DIAGNOSIS — L89623 Pressure ulcer of left heel, stage 3: Secondary | ICD-10-CM | POA: Diagnosis not present

## 2018-03-03 DIAGNOSIS — E1165 Type 2 diabetes mellitus with hyperglycemia: Secondary | ICD-10-CM | POA: Diagnosis not present

## 2018-03-03 DIAGNOSIS — J129 Viral pneumonia, unspecified: Secondary | ICD-10-CM | POA: Diagnosis not present

## 2018-03-08 DIAGNOSIS — L89623 Pressure ulcer of left heel, stage 3: Secondary | ICD-10-CM | POA: Diagnosis not present

## 2018-03-09 ENCOUNTER — Non-Acute Institutional Stay: Payer: PPO | Admitting: Primary Care

## 2018-03-09 DIAGNOSIS — M159 Polyosteoarthritis, unspecified: Secondary | ICD-10-CM

## 2018-03-09 DIAGNOSIS — Z515 Encounter for palliative care: Secondary | ICD-10-CM

## 2018-03-09 DIAGNOSIS — M150 Primary generalized (osteo)arthritis: Secondary | ICD-10-CM

## 2018-03-09 DIAGNOSIS — I5032 Chronic diastolic (congestive) heart failure: Secondary | ICD-10-CM

## 2018-03-09 DIAGNOSIS — I2510 Atherosclerotic heart disease of native coronary artery without angina pectoris: Secondary | ICD-10-CM

## 2018-03-09 DIAGNOSIS — L89623 Pressure ulcer of left heel, stage 3: Secondary | ICD-10-CM | POA: Diagnosis not present

## 2018-03-09 DIAGNOSIS — Z9861 Coronary angioplasty status: Secondary | ICD-10-CM | POA: Diagnosis not present

## 2018-03-09 DIAGNOSIS — T84031D Mechanical loosening of internal left hip prosthetic joint, subsequent encounter: Secondary | ICD-10-CM | POA: Diagnosis not present

## 2018-03-09 DIAGNOSIS — I4821 Permanent atrial fibrillation: Secondary | ICD-10-CM | POA: Diagnosis not present

## 2018-03-09 DIAGNOSIS — I110 Hypertensive heart disease with heart failure: Secondary | ICD-10-CM | POA: Diagnosis not present

## 2018-03-09 NOTE — Progress Notes (Signed)
PALLIATIVE CARE CONSULT VISIT   PATIENT NAME: [name] DOB: June 27, 1916 MRN: [id number]  PRIMARY CARE PROVIDER:   Leeroy Cha, MD  REFERRING PROVIDER:  Leeroy Cha, MD [address] [place], [place] [address]  RESPONSIBLE PARTYJiles Bell Niece [phone]  [phone]  [name] Niece   [phone]   Initial eval for palliative services, specifically  SNF placement due to cognitive decline. Upon exam however patient is seemingly close to her mental baseline and is participating in some adls and physical therapy.  ASSESSMENTS and  RECOMMENDATIONS 1. Mentation: On depakote bid for some agitation behaviors that seem well controlled with minimal side effects. Continue with this regimen.  2. Functional loss:   A.  Continue with home health PT POC to maximum goal attainment.   b.ALF seems best level of care at this time due to family caregiver availability PT is currently seeing patient and will work with her on goals of rehabilitation. Patient states she will comply.   3. Goal of Care/advance care planning:   a.DNR is in place.  b.POA is shared by 2 nieces.   c.D/c simvastatin to decrease pill burden, family in agrement  d. Niece to return MOST form after she discusses with her cousin.  Patient's own children are deceased. Patient will move to ALF permanently, previously lived independently in her own home prior to fall. Facilitated discussion with patient about this transition. Niece was present for meeting.Will follow for clarification of goals of care.  Return 1 month or prn.  I spent 30 minutes providing this consultation,  from 10:30 am  to 11:00 am. More than 50% of the time in this consultation was spent coordinating communication.   HISTORY OF PRESENT ILLNESS:  [name] is a 84 y.o. year old female with multiple medical problems including HTN, CAD, A fib, CHF, hypothyroid, fracture of left femoral head. Palliative  Care was asked to help address goals of care.   CODE STATUS:  DNR in place, family to consider MOST  PPS: 30% HOSPICE ELIGIBILITY/DIAGNOSIS: TBD  PAST MEDICAL HISTORY:  Past Medical History:  Diagnosis Date  . Atrial fibrillation ([place])   . CAD (coronary artery disease)   . Colitis   . Colon cancer ([place])   . Diverticulosis of colon   . DJD (degenerative joint disease)   . DM (diabetes mellitus) ([place])   . Hip fracture ([place])   . HLD (hyperlipidemia)   . HTN (hypertension)   . Hypothyroidism   . Mobitz (type) II atrioventricular block   . Ventral hernia     SOCIAL HX:  Social History   Tobacco Use  . Smoking status: Never Smoker  . Smokeless tobacco: Never Used  Substance Use Topics  . Alcohol use: No    ALLERGIES:  Allergies  Allergen Reactions  . Diclofenac Sodium Other (See Comments)    Noted to be "allergic," per MAR  . Duragesic-100 [Fentanyl] Other (See Comments)    Noted to be "allergic," per MAR  . Hytrin [Terazosin Hcl] Other (See Comments)    Noted to be "allergic," per MAR   . Ibuprofen Other (See Comments)    Noted to be "allergic," per MAR  . Norvasc [Amlodipine Besylate] Other (See Comments)    Noted to be "allergic," per MAR  . Plavix [Clopidogrel Bisulfate] Other (See Comments)    Noted to be "allergic," per [facility]      PERTINENT MEDICATIONS:  Outpatient Encounter Medications as of 03/09/2018  Medication  Sig  . acetaminophen (TYLENOL) 325 MG tablet Take 2 tablets (650 mg total) by mouth every 6 (six) hours as needed.  . cefdinir (OMNICEF) 300 MG capsule Take 1 capsule (300 mg total) by mouth every 12 (twelve) hours. 2 more days  . ELIQUIS 5 MG TABS tablet TAKE 1 TABLET BY MOUTH TWICE DAILY  . escitalopram (LEXAPRO) 5 MG tablet Take 5 mg by mouth daily.  . ferrous sulfate 325 (65 FE) MG tablet Take 1 tablet (325 mg total) by mouth every 12 (twelve) hours.  . furosemide (LASIX) 40 MG tablet Take 1 tablet (40 mg total) by mouth daily.  Marland Kitchen  levothyroxine (SYNTHROID, LEVOTHROID) 88 MCG tablet Take 88 mcg by mouth daily.  . polyethylene glycol (MIRALAX / GLYCOLAX) packet Take 17 g by mouth daily.  . potassium chloride SA (K-DUR,KLOR-CON) 20 MEQ tablet Take 1 tablet (20 mEq total) by mouth daily.  Marland Kitchen senna-docusate (SENEXON-S) 8.6-50 MG tablet Take 1 tablet by mouth daily.  . simvastatin (ZOCOR) 40 MG tablet Take 1 tablet (40 mg total) by mouth at bedtime.  . [DISCONTINUED] Probiotic Product (ALIGN PO) Take 1 capsule by mouth daily.     No facility-administered encounter medications on file as of 03/09/2018.     PHYSICAL EXAM:   VS 157/74  98.4-60-18  General: NAD, frail appearing,  Cardiovascular:irregular rate and rhythm, S1S2 Pulmonary: clear all lung fields denies doe Abdomen: soft, nontender, + bowel sounds. Denies constipation GU: no suprapubic tenderness. Incontinent of urine, recent UTI Extremities: 2+ pitting edema, no joint deformities. Eschar left heel per staff report. Skin: no rashes, L heel wrapped, eschar, unstageable pressure injury Neurological: Weakness but otherwise nonfocal, fair memory, sleeps well. [name] Skeeters DNP, AGPCNP-BC

## 2018-03-11 DIAGNOSIS — D649 Anemia, unspecified: Secondary | ICD-10-CM | POA: Diagnosis not present

## 2018-03-11 DIAGNOSIS — E039 Hypothyroidism, unspecified: Secondary | ICD-10-CM | POA: Diagnosis not present

## 2018-03-11 DIAGNOSIS — Z79899 Other long term (current) drug therapy: Secondary | ICD-10-CM | POA: Diagnosis not present

## 2018-03-11 DIAGNOSIS — D509 Iron deficiency anemia, unspecified: Secondary | ICD-10-CM | POA: Diagnosis not present

## 2018-03-11 DIAGNOSIS — E119 Type 2 diabetes mellitus without complications: Secondary | ICD-10-CM | POA: Diagnosis not present

## 2018-03-15 DIAGNOSIS — L89623 Pressure ulcer of left heel, stage 3: Secondary | ICD-10-CM | POA: Diagnosis not present

## 2018-03-15 DIAGNOSIS — I5032 Chronic diastolic (congestive) heart failure: Secondary | ICD-10-CM | POA: Diagnosis not present

## 2018-03-15 DIAGNOSIS — T84031D Mechanical loosening of internal left hip prosthetic joint, subsequent encounter: Secondary | ICD-10-CM | POA: Diagnosis not present

## 2018-03-15 DIAGNOSIS — I110 Hypertensive heart disease with heart failure: Secondary | ICD-10-CM | POA: Diagnosis not present

## 2018-03-15 DIAGNOSIS — I4821 Permanent atrial fibrillation: Secondary | ICD-10-CM | POA: Diagnosis not present

## 2018-03-16 DIAGNOSIS — L89623 Pressure ulcer of left heel, stage 3: Secondary | ICD-10-CM | POA: Diagnosis not present

## 2018-03-17 DIAGNOSIS — L89623 Pressure ulcer of left heel, stage 3: Secondary | ICD-10-CM | POA: Diagnosis not present

## 2018-03-23 DIAGNOSIS — L89623 Pressure ulcer of left heel, stage 3: Secondary | ICD-10-CM | POA: Diagnosis not present

## 2018-03-30 DIAGNOSIS — L89623 Pressure ulcer of left heel, stage 3: Secondary | ICD-10-CM | POA: Diagnosis not present

## 2018-03-31 DIAGNOSIS — I5032 Chronic diastolic (congestive) heart failure: Secondary | ICD-10-CM | POA: Diagnosis not present

## 2018-03-31 DIAGNOSIS — L89623 Pressure ulcer of left heel, stage 3: Secondary | ICD-10-CM | POA: Diagnosis not present

## 2018-03-31 DIAGNOSIS — I110 Hypertensive heart disease with heart failure: Secondary | ICD-10-CM | POA: Diagnosis not present

## 2018-03-31 DIAGNOSIS — T84031D Mechanical loosening of internal left hip prosthetic joint, subsequent encounter: Secondary | ICD-10-CM | POA: Diagnosis not present

## 2018-03-31 DIAGNOSIS — I4821 Permanent atrial fibrillation: Secondary | ICD-10-CM | POA: Diagnosis not present

## 2018-04-06 DIAGNOSIS — L89623 Pressure ulcer of left heel, stage 3: Secondary | ICD-10-CM | POA: Diagnosis not present

## 2018-04-09 NOTE — Progress Notes (Deleted)
[name] Date of Birth: 13-Sep-1916   History of Present Illness: [name] is seen for followup CAD and Afib. She is  84 years old. She has a history of coronary disease and is status post stenting of the mid right coronary in February 2010 with a bare-metal stent. She had moderate three-vessel disease at that time. She also has a history of AV block with presyncope and has had previous pacemaker implant. She has a history of chronic atrial fibrillation managed with rate control and anticoagulation. She has been on Eliquis.   She was admitted to the hospital in January 2017 with acute diastolic CHF. She was diuresed with good response. Echo showed EF 50-55% with moderate LVH and restrictive physiology. Moderate MR and TR and biatrial enlargement. Moderate pulmonary HTN.  Last pacer check on 09/24/17 was normal.   She was admitted in July with hip fracture following a fall. She had percutaneous hip pinning. Returned in August with continued hip pain and underwent ORIF. Post op course complicated by urinary retention.   On follow up today she is seen with her niece.  She thinks her memory is worse but her niece states it is fine. She can remember names fine and takes care of herself. She does have Meals on Wheels and a lady checks on her daily. She denies any chest pain, SOB, palpitations, edema, dizziness.   Current Outpatient Medications on File Prior to Visit  Medication Sig Dispense Refill  . acetaminophen (TYLENOL) 325 MG tablet Take 2 tablets (650 mg total) by mouth every 6 (six) hours as needed. 60 tablet 1  . cefdinir (OMNICEF) 300 MG capsule Take 1 capsule (300 mg total) by mouth every 12 (twelve) hours. 2 more days    . ELIQUIS 5 MG TABS tablet TAKE 1 TABLET BY MOUTH TWICE DAILY 180 tablet 1  . escitalopram (LEXAPRO) 5 MG tablet Take 5 mg by mouth daily.    . ferrous sulfate 325 (65 FE) MG tablet Take 1 tablet (325 mg total) by mouth every 12 (twelve) hours.  3  .  furosemide (LASIX) 40 MG tablet Take 1 tablet (40 mg total) by mouth daily. 90 tablet 3  . levothyroxine (SYNTHROID, LEVOTHROID) 88 MCG tablet Take 88 mcg by mouth daily.  6  . polyethylene glycol (MIRALAX / GLYCOLAX) packet Take 17 g by mouth daily. 14 each 0  . potassium chloride SA (K-DUR,KLOR-CON) 20 MEQ tablet Take 1 tablet (20 mEq total) by mouth daily. 1 tablet 0  . senna-docusate (SENEXON-S) 8.6-50 MG tablet Take 1 tablet by mouth daily.    . simvastatin (ZOCOR) 40 MG tablet Take 1 tablet (40 mg total) by mouth at bedtime. 90 tablet 3  . [DISCONTINUED] Probiotic Product (ALIGN PO) Take 1 capsule by mouth daily.       No current facility-administered medications on file prior to visit.     Allergies  Allergen Reactions  . Diclofenac Sodium Other (See Comments)    Noted to be "allergic," per MAR  . Duragesic-100 [Fentanyl] Other (See Comments)    Noted to be "allergic," per MAR  . Hytrin [Terazosin Hcl] Other (See Comments)    Noted to be "allergic," per MAR   . Ibuprofen Other (See Comments)    Noted to be "allergic," per MAR  . Norvasc [Amlodipine Besylate] Other (See Comments)    Noted to be "allergic," per MAR  . Plavix [Clopidogrel Bisulfate] Other (See Comments)    Noted to be "allergic," per [facility]  Past Medical History:  Diagnosis Date  . Atrial fibrillation ([place])   . CAD (coronary artery disease)   . Colitis   . Colon cancer ([place])   . Diverticulosis of colon   . DJD (degenerative joint disease)   . DM (diabetes mellitus) ([place])   . Hip fracture ([place])   . HLD (hyperlipidemia)   . HTN (hypertension)   . Hypothyroidism   . Mobitz (type) II atrioventricular block   . Ventral hernia     Past Surgical History:  Procedure Laterality Date  . APPENDECTOMY    . CARDIAC CATHETERIZATION  08/22/1997  . CORONARY ANGIOPLASTY  07/03/2008  . HEMICOLECTOMY  10/07   right  . HIP ARTHROPLASTY Left 12/24/2017   Procedure: LEFT ARTHROPLASTY BIPOLAR HIP (HEMIARTHROPLASTY);   Surgeon: [name], MD;  Location: [facility];  Service: Orthopedics;  Laterality: Left;  . HIP PINNING,CANNULATED Left 12/07/2017   Procedure: CANNULATED HIP PINNING;  Surgeon: [name], MD;  Location: [facility];  Service: Orthopedics;  Laterality: Left;  . PACEMAKER INSERTION  07/06/08   MDT [id number] implanted for Mobitz II heart block by Dr Rayann Heman  . ROTATOR CUFF REPAIR    . TRANSTHORACIC ECHOCARDIOGRAM  06/29/2008   EF 55-60%    Social History   Tobacco Use  Smoking Status Never Smoker  Smokeless Tobacco Never Used    Social History   Substance and Sexual Activity  Alcohol Use No    Family History  Problem Relation Age of Onset  . Heart attack Father     Review of Systems:  as noted in history of present illness.  All other systems were reviewed and are negative.  Physical Exam: There were no vitals taken for this visit. GENERAL:  Well appearing elderly WF in NAD HEENT:  PERRL, EOMI, sclera are clear. Oropharynx is clear. NECK:  No jugular venous distention, carotid upstroke brisk and symmetric, no bruits, no thyromegaly or adenopathy LUNGS:  Clear to auscultation bilaterally CHEST:  Unremarkable HEART:  IRRR,  PMI not displaced or sustained,S1 and S2 within normal limits, no S3, no S4: no clicks, no rubs, soft systolic murmur RUSB ABD:  Soft, nontender. BS +, no masses or bruits. No hepatomegaly, no splenomegaly EXT:  2 + pulses throughout, no edema, no cyanosis no clubbing SKIN:  Warm and dry.  No rashes NEURO:  Alert and oriented x 3. Cranial nerves II through XII intact. PSYCH:  Cognitively intact    LABORATORY DATA: Lab Results  Component Value Date   WBC 11.2 (H) 12/26/2017   HGB 9.4 (L) 12/26/2017   HCT 29.1 (L) 12/26/2017   PLT 325 12/26/2017   GLUCOSE 168 (H) 12/26/2017   CHOL  06/28/2008    156        ATP III CLASSIFICATION:  <200     mg/dL   Desirable  200-239  mg/dL   Borderline High  >=240    mg/dL   High          TRIG 160 (H) 06/28/2008    HDL 30 (L) 06/28/2008   LDLCALC  06/28/2008    94        Total Cholesterol/HDL:CHD Risk Coronary Heart Disease Risk Table                     Men   Women  1/2 Average Risk   3.4   3.3  Average Risk       5.0   4.4  2 X Average Risk  9.6   7.1  3 X Average Risk  23.4   11.0        Use the calculated Patient Ratio above and the CHD Risk Table to determine the patient's CHD Risk.        ATP III CLASSIFICATION (LDL):  <100     mg/dL   Optimal  100-129  mg/dL   Near or Above                    Optimal  130-159  mg/dL   Borderline  160-189  mg/dL   High  >190     mg/dL   Very High   ALT 14 12/23/2017   AST 20 12/23/2017   NA 132 (L) 12/26/2017   K 4.2 12/26/2017   CL 98 12/26/2017   CREATININE 1.11 (H) 12/26/2017   BUN 15 12/26/2017   CO2 23 12/26/2017   TSH 8.303 (H) 05/16/2015   INR 1.13 12/07/2017   HGBA1C 7.1 (H) 12/07/2017   Labs dated 01/30/15 cholesterol 142, triglycerides 136, HDL 40, LDL 71 Dated 06/17/16: cholesterol 191, triglycerides 442, HDL 40, LDL 85. A1c 9%. Hgb, chemistries, TSH normal.  Dated 07/13/17: cholesterol 170, triglycerides 182, HDL 45, LDL 88. A1c 7.1%. TSH 5.9. Chemistries and CBC are normal.  Assessment / Plan: 1. Coronary disease status post stenting of the mid RCA in February 2010 with a bare-metal stent. Has moderate three-vessel disease. She is asymptomatic. Not on ASA due to Eliquis. Continue statin Rx.  2. Complete heart block status post pacemaker implant. Now programmed to VVI mode due to chronic Afib. Pacemaker check normal in November 2018. Was recently checked in January but report not in computer.   3. Hypertension, controlled. Continue losartan.  4. Atrial fibrillation. Rate controlled. On Eliquis.   5. Chronic diastolic CHF. Appears to be well compensated. Continue to  watch salt. Continue current therapy.   I will follow up in 6 months.

## 2018-04-13 DIAGNOSIS — L89623 Pressure ulcer of left heel, stage 3: Secondary | ICD-10-CM | POA: Diagnosis not present

## 2018-04-14 ENCOUNTER — Ambulatory Visit: Payer: PPO | Admitting: Cardiology

## 2018-04-14 DIAGNOSIS — I2510 Atherosclerotic heart disease of native coronary artery without angina pectoris: Secondary | ICD-10-CM | POA: Diagnosis not present

## 2018-04-14 DIAGNOSIS — I110 Hypertensive heart disease with heart failure: Secondary | ICD-10-CM | POA: Diagnosis not present

## 2018-04-14 DIAGNOSIS — I4821 Permanent atrial fibrillation: Secondary | ICD-10-CM | POA: Diagnosis not present

## 2018-04-14 DIAGNOSIS — I5032 Chronic diastolic (congestive) heart failure: Secondary | ICD-10-CM | POA: Diagnosis not present

## 2018-04-14 DIAGNOSIS — M1991 Primary osteoarthritis, unspecified site: Secondary | ICD-10-CM | POA: Diagnosis not present

## 2018-04-20 ENCOUNTER — Non-Acute Institutional Stay: Payer: PPO | Admitting: Primary Care

## 2018-04-20 DIAGNOSIS — E119 Type 2 diabetes mellitus without complications: Secondary | ICD-10-CM

## 2018-04-20 DIAGNOSIS — I5032 Chronic diastolic (congestive) heart failure: Secondary | ICD-10-CM

## 2018-04-20 DIAGNOSIS — Z515 Encounter for palliative care: Secondary | ICD-10-CM | POA: Diagnosis not present

## 2018-04-20 DIAGNOSIS — L89620 Pressure ulcer of left heel, unstageable: Secondary | ICD-10-CM | POA: Diagnosis not present

## 2018-04-20 DIAGNOSIS — L89623 Pressure ulcer of left heel, stage 3: Secondary | ICD-10-CM | POA: Diagnosis not present

## 2018-04-20 NOTE — Progress Notes (Signed)
[facility] Telephone: [phone] Fax: [phone]  PATIENT NAME: Lari Linson DOB: 1916-12-14 MRN: [id number]  PRIMARY CARE PROVIDER:   Leeroy Cha, MD  REFERRING PROVIDER:  Leeroy Cha, MD [address] [place], [place] [address]  RESPONSIBLE PARTY:   Extended Emergency Contact Information Primary Emergency Contact: Aheron,Helen Address: [address]          [place], [place] [address] [place] Phone: [phone] Mobile Phone: [phone] Relation: Niece Secondary Emergency Contact: [place] Mobile Phone: [phone] Relation: Niece  ASSESSMENT and RECOMMENDATIONS:   Nephew available during interview. States patient is at her cognitive baseline. Patient sitting up in chair, pleasant and comfortable. Anticipates 89th birthday next week.   1. Mentation: Agitation improved with depakote, no further combative behavior. Staff states patient is usually calm and comfortable. FAST score = 6c-d.  2. Functional Loss: Would not recommend further PT at this time due to debility and dementia. Activity by staff and ROM would be acceptable alternative. PT finished from previously. No falls reported. Pivot transfers with 1-2 person assist, has improved from 1 person assist per staff report. Staff use a sit to stand lift for toileting.   3. Goals of Care: DNR on chart. MOST left for family to discuss. Nephew present but nieces are POA. Left phone messages with nieces to examine MOST form and make any additions or updates needed. Currently eating well and wound is healing; hospice not yet appropriate for patient at her functional level.   Continue to follow for palliative care, goals of care discussions. Return 1 month.  I spent 25 minutes providing this consultation,  from 1030 to 1055. More than 50% of the time in this consultation was spent coordinating communication.   HISTORY OF PRESENT ILLNESS:  Evangelyne Loja is a 84 y.o. year old female with multiple medical problems including HTN, CAD, DM2, hypothyroid. Palliative Care was asked to help address goals of care.   CODE STATUS: DNR  PPS: 40% (weak) HOSPICE ELIGIBILITY/DIAGNOSIS: TBD  PAST MEDICAL HISTORY:  Past Medical History:  Diagnosis Date  . Atrial fibrillation ([place])   . CAD (coronary artery disease)   . Colitis   . Colon cancer ([place])   . Diverticulosis of colon   . DJD (degenerative joint disease)   . DM (diabetes mellitus) ([place])   . Hip fracture ([place])   . HLD (hyperlipidemia)   . HTN (hypertension)   . Hypothyroidism   . Mobitz (type) II atrioventricular block   . Ventral hernia     SOCIAL HX:  Social History   Tobacco Use  . Smoking status: Never Smoker  . Smokeless tobacco: Never Used  Substance Use Topics  . Alcohol use: No    ALLERGIES:  Allergies  Allergen Reactions  . Diclofenac Sodium Other (See Comments)    Noted to be "allergic," per MAR  . Duragesic-100 [Fentanyl] Other (See Comments)    Noted to be "allergic," per MAR  . Hytrin [Terazosin Hcl] Other (See Comments)    Noted to be "allergic," per MAR   . Ibuprofen Other (See Comments)    Noted to be "allergic," per MAR  . Norvasc [Amlodipine Besylate] Other (See Comments)    Noted to be "allergic," per MAR  . Plavix [Clopidogrel Bisulfate] Other (See Comments)    Noted to be "allergic," per [facility]      PERTINENT MEDICATIONS:  Outpatient Encounter Medications as of 04/20/2018  Medication Sig  . acetaminophen (TYLENOL) 325 MG  tablet Take 2 tablets (650 mg total) by mouth every 6 (six) hours as needed.  . cefdinir (OMNICEF) 300 MG capsule Take 1 capsule (300 mg total) by mouth every 12 (twelve) hours. 2 more days  . ELIQUIS 5 MG TABS tablet TAKE 1 TABLET BY MOUTH TWICE DAILY  . escitalopram (LEXAPRO) 5 MG tablet Take 5 mg by mouth daily.  . ferrous sulfate 325 (65 FE) MG tablet Take 1 tablet (325 mg total) by mouth every 12 (twelve) hours.  .  furosemide (LASIX) 40 MG tablet Take 1 tablet (40 mg total) by mouth daily.  Marland Kitchen levothyroxine (SYNTHROID, LEVOTHROID) 88 MCG tablet Take 88 mcg by mouth daily.  . polyethylene glycol (MIRALAX / GLYCOLAX) packet Take 17 g by mouth daily.  . potassium chloride SA (K-DUR,KLOR-CON) 20 MEQ tablet Take 1 tablet (20 mEq total) by mouth daily.  Marland Kitchen senna-docusate (SENEXON-S) 8.6-50 MG tablet Take 1 tablet by mouth daily.  . simvastatin (ZOCOR) 40 MG tablet Take 1 tablet (40 mg total) by mouth at bedtime.  . [DISCONTINUED] Probiotic Product (ALIGN PO) Take 1 capsule by mouth daily.     No facility-administered encounter medications on file as of 04/20/2018.     PHYSICAL EXAM:   VS 98.6-  62-18 134/94, weight: 165.4 lbs. General: NAD, frail appearing, WNWD, limited conversation Cardiovascular: regular rate and rhythm, S1S2 Pulmonary: clear ant fields, left lower lobe with rales, right lower lobe clear,  no cough Abdomen: soft, nontender, + bowel sounds, no constipation, eats well. Extremities:LE  L 3+, R 2+ edema, no joint deformities, 1 person assistance to  pivot transfer, uses sit to stand lift for toileting.  Skin: no rashes, wound on left heel, staff states is healing with debridment and dressing. Neurological: Weakness, memory loss, at cognitive baseline, feeds self  Cyndia Skeeters DNP, AGPCNP-BC

## 2018-04-22 DIAGNOSIS — Z79899 Other long term (current) drug therapy: Secondary | ICD-10-CM | POA: Diagnosis not present

## 2018-04-22 DIAGNOSIS — E039 Hypothyroidism, unspecified: Secondary | ICD-10-CM | POA: Diagnosis not present

## 2018-04-27 DIAGNOSIS — L89620 Pressure ulcer of left heel, unstageable: Secondary | ICD-10-CM | POA: Diagnosis not present

## 2018-04-29 ENCOUNTER — Encounter (HOSPITAL_COMMUNITY): Payer: Self-pay

## 2018-04-29 ENCOUNTER — Other Ambulatory Visit: Payer: Self-pay

## 2018-04-29 ENCOUNTER — Emergency Department (HOSPITAL_COMMUNITY): Payer: PPO

## 2018-04-29 ENCOUNTER — Emergency Department (HOSPITAL_COMMUNITY)
Admission: EM | Admit: 2018-04-29 | Discharge: 2018-04-30 | Disposition: A | Discharge status: Home / Self Care | Payer: PPO | Attending: Emergency Medicine | Admitting: Emergency Medicine

## 2018-04-29 DIAGNOSIS — Z950 Presence of cardiac pacemaker: Secondary | ICD-10-CM | POA: Diagnosis not present

## 2018-04-29 DIAGNOSIS — F0390 Unspecified dementia without behavioral disturbance: Secondary | ICD-10-CM | POA: Diagnosis not present

## 2018-04-29 DIAGNOSIS — I4821 Permanent atrial fibrillation: Secondary | ICD-10-CM | POA: Diagnosis not present

## 2018-04-29 DIAGNOSIS — Z7901 Long term (current) use of anticoagulants: Secondary | ICD-10-CM | POA: Diagnosis not present

## 2018-04-29 DIAGNOSIS — E119 Type 2 diabetes mellitus without complications: Secondary | ICD-10-CM | POA: Insufficient documentation

## 2018-04-29 DIAGNOSIS — S99922A Unspecified injury of left foot, initial encounter: Secondary | ICD-10-CM | POA: Diagnosis not present

## 2018-04-29 DIAGNOSIS — I110 Hypertensive heart disease with heart failure: Secondary | ICD-10-CM | POA: Diagnosis not present

## 2018-04-29 DIAGNOSIS — Z79899 Other long term (current) drug therapy: Secondary | ICD-10-CM | POA: Diagnosis not present

## 2018-04-29 DIAGNOSIS — W19XXXA Unspecified fall, initial encounter: Secondary | ICD-10-CM | POA: Diagnosis not present

## 2018-04-29 DIAGNOSIS — I5032 Chronic diastolic (congestive) heart failure: Secondary | ICD-10-CM | POA: Insufficient documentation

## 2018-04-29 DIAGNOSIS — E785 Hyperlipidemia, unspecified: Secondary | ICD-10-CM | POA: Diagnosis not present

## 2018-04-29 DIAGNOSIS — S79911A Unspecified injury of right hip, initial encounter: Secondary | ICD-10-CM | POA: Diagnosis not present

## 2018-04-29 DIAGNOSIS — S0990XA Unspecified injury of head, initial encounter: Secondary | ICD-10-CM | POA: Diagnosis not present

## 2018-04-29 DIAGNOSIS — I2510 Atherosclerotic heart disease of native coronary artery without angina pectoris: Secondary | ICD-10-CM | POA: Diagnosis not present

## 2018-04-29 DIAGNOSIS — L97422 Non-pressure chronic ulcer of left heel and midfoot with fat layer exposed: Secondary | ICD-10-CM | POA: Insufficient documentation

## 2018-04-29 DIAGNOSIS — E039 Hypothyroidism, unspecified: Secondary | ICD-10-CM | POA: Diagnosis not present

## 2018-04-29 DIAGNOSIS — R52 Pain, unspecified: Secondary | ICD-10-CM | POA: Diagnosis not present

## 2018-04-29 DIAGNOSIS — M25552 Pain in left hip: Secondary | ICD-10-CM | POA: Diagnosis not present

## 2018-04-29 DIAGNOSIS — Z794 Long term (current) use of insulin: Secondary | ICD-10-CM | POA: Insufficient documentation

## 2018-04-29 DIAGNOSIS — I10 Essential (primary) hypertension: Secondary | ICD-10-CM | POA: Diagnosis not present

## 2018-04-29 DIAGNOSIS — S79912A Unspecified injury of left hip, initial encounter: Secondary | ICD-10-CM | POA: Diagnosis not present

## 2018-04-29 LAB — CBC WITH DIFFERENTIAL/PLATELET
ABS IMMATURE GRANULOCYTES: 0.03 10*3/uL (ref 0.00–0.07)
BASOS ABS: 0 10*3/uL (ref 0.0–0.1)
Basophils Relative: 1 %
Eosinophils Absolute: 0.6 10*3/uL — ABNORMAL HIGH (ref 0.0–0.5)
Eosinophils Relative: 9 %
HEMATOCRIT: 39.6 % (ref 36.0–46.0)
Hemoglobin: 12.2 g/dL (ref 12.0–15.0)
Immature Granulocytes: 1 %
LYMPHS ABS: 1.3 10*3/uL (ref 0.7–4.0)
LYMPHS PCT: 20 %
MCH: 29.1 pg (ref 26.0–34.0)
MCHC: 30.8 g/dL (ref 30.0–36.0)
MCV: 94.5 fL (ref 80.0–100.0)
MONO ABS: 0.6 10*3/uL (ref 0.1–1.0)
Monocytes Relative: 10 %
NEUTROS ABS: 3.9 10*3/uL (ref 1.7–7.7)
NRBC: 0 % (ref 0.0–0.2)
Neutrophils Relative %: 59 %
Platelets: 271 10*3/uL (ref 150–400)
RBC: 4.19 MIL/uL (ref 3.87–5.11)
RDW: 16.1 % — ABNORMAL HIGH (ref 11.5–15.5)
WBC: 6.4 10*3/uL (ref 4.0–10.5)

## 2018-04-29 LAB — BASIC METABOLIC PANEL
Anion gap: 12 (ref 5–15)
BUN: 10 mg/dL (ref 8–23)
CO2: 24 mmol/L (ref 22–32)
Calcium: 8.9 mg/dL (ref 8.9–10.3)
Chloride: 105 mmol/L (ref 98–111)
Creatinine, Ser: 0.69 mg/dL (ref 0.44–1.00)
GFR calc non Af Amer: >60 mL/min (ref >60)
Glucose, Bld: 114 mg/dL — ABNORMAL HIGH (ref 70–99)
POTASSIUM: 3.5 mmol/L (ref 3.5–5.1)
SODIUM: 141 mmol/L (ref 135–145)

## 2018-04-29 NOTE — ED Provider Notes (Signed)
[facility] EMERGENCY DEPARTMENT Provider Note   CSN: [id number] Arrival date & time: 04/29/18  1632     History   Chief Complaint Chief Complaint  Patient presents with  . Fall    HPI [name] is a 84 y.o. female.  Patient is hard of hearing but she is accompanied by family members.  Patient was brought in by EMS from collapse after unwitnessed fall.  Patient has dementia she has had a left hip replacement she has diabetes.  She has proximal atrial fibrillation on Eliquis.  Has a pacemaker.  Currently complaining of left hip pain and left heel pain.  No obvious deformities or trauma.  Patient's left foot is bandaged.  [name] has a chronic ulcer on the heel.  Patient's blood sugar for EMS was 201.  Patient here without any other complaints.  Patient does not ambulate.  But she spends a lot of time in wheelchair.     Past Medical History:  Diagnosis Date  . Atrial fibrillation ([place])   . CAD (coronary artery disease)   . Colitis   . Colon cancer ([place])   . Diverticulosis of colon   . DJD (degenerative joint disease)   . DM (diabetes mellitus) ([place])   . Hip fracture ([place])   . HLD (hyperlipidemia)   . HTN (hypertension)   . Hypothyroidism   . Mobitz (type) II atrioventricular block   . Ventral hernia     Patient Active Problem List   Diagnosis Date Noted  . UTI (urinary tract infection) 12/07/2017  . Fracture of femoral neck, left ([place])   . Mobitz type II atrioventricular block   . Chronic anticoagulation 05/24/2015  . Permanent atrial fibrillation 05/14/2015  . Chronic diastolic CHF (congestive heart failure) ([place]) 05/14/2015  . Acute respiratory failure ([place]) 05/14/2015  . Elevated troponin 05/14/2015  . Type II diabetes mellitus, well controlled ([place]) 05/14/2015  . Hypothyroid 05/14/2015  . Pacemaker 02/12/2012  . History of colon cancer 03/25/2011  . Edema 02/19/2011  . COLITIS 12/06/2008  . DIARRHEA 11/23/2008  . CAD S/P percutaneous  coronary angioplasty 09/28/2008  . VENTRAL HERNIA 09/28/2008  . SYNCOPE 09/28/2008  . Dyslipidemia 08/10/2007  . Essential hypertension 08/10/2007  . INTERNAL HEMORRHOIDS 08/10/2007  . Osteoarthritis 08/10/2007  . DIVERTICULOSIS OF COLON 02/17/2007    Past Surgical History:  Procedure Laterality Date  . APPENDECTOMY    . CARDIAC CATHETERIZATION  08/22/1997  . CORONARY ANGIOPLASTY  07/03/2008  . HEMICOLECTOMY  10/07   right  . HIP ARTHROPLASTY Left 12/24/2017   Procedure: LEFT ARTHROPLASTY BIPOLAR HIP (HEMIARTHROPLASTY);  Surgeon: [name], MD;  Location: [facility];  Service: Orthopedics;  Laterality: Left;  . HIP PINNING,CANNULATED Left 12/07/2017   Procedure: CANNULATED HIP PINNING;  Surgeon: [name], MD;  Location: [facility];  Service: Orthopedics;  Laterality: Left;  . PACEMAKER INSERTION  07/06/08   MDT [id number] implanted for Mobitz II heart block by Dr Rayann Heman  . ROTATOR CUFF REPAIR    . TRANSTHORACIC ECHOCARDIOGRAM  06/29/2008   EF 55-60%     OB History   No obstetric history on file.      Home Medications    Prior to Admission medications   Medication Sig Start Date End Date Taking? Authorizing Provider  acetaminophen (TYLENOL) 325 MG tablet Take 2 tablets (650 mg total) by mouth every 6 (six) hours as needed. 12/24/17  Yes [name], MD  divalproex (DEPAKOTE SPRINKLE) 125 MG capsule Take 125 mg by mouth 2 (  two) times daily.   Yes [provider]  ELIQUIS 5 MG TABS tablet TAKE 1 TABLET BY MOUTH TWICE DAILY Patient taking differently: Take 5 mg by mouth 2 (two) times daily.  10/27/17  Yes Martinique, Peter M, MD  escitalopram (LEXAPRO) 5 MG tablet Take 5 mg by mouth daily.   Yes [provider]  ferrous sulfate 325 (65 FE) MG tablet Take 1 tablet (325 mg total) by mouth every 12 (twelve) hours. Patient taking differently: Take 325 mg by mouth 2 (two) times daily.  12/26/17  Yes Vasireddy, Grier Mitts, MD  furosemide (LASIX) 40 MG tablet Take 1 tablet (40 mg  total) by mouth daily. 11/10/12  Yes Martinique, Peter M, MD  gabapentin (NEURONTIN) 300 MG capsule Take 300 mg by mouth at bedtime.   Yes [provider]  insulin aspart (NOVOLOG) 100 UNIT/ML injection Inject 4 Units into the skin See admin instructions. Every day before lunch for 2 weeks 04/21/18  Yes [provider]  levothyroxine (SYNTHROID, LEVOTHROID) 100 MCG tablet Take 100 mcg by mouth daily.  11/30/17  Yes [provider]  magnesium hydroxide (MILK OF MAGNESIA) 400 MG/5ML suspension Take 30 mLs by mouth daily as needed for mild constipation.   Yes [provider]  Multiple Vitamins-Minerals (MULTIVITAMIN PO) Take 1 tablet by mouth daily.   Yes [provider]  polyethylene glycol (MIRALAX / GLYCOLAX) packet Take 17 g by mouth daily. 12/09/17  Yes Ghimire, Henreitta Leber, MD  potassium chloride SA (K-DUR,KLOR-CON) 20 MEQ tablet Take 1 tablet (20 mEq total) by mouth daily. 12/09/17  Yes Ghimire, Henreitta Leber, MD  senna-docusate (SENEXON-S) 8.6-50 MG tablet Take 1 tablet by mouth daily.   Yes [provider]  UNABLE TO FIND mentholatrm ; Apply to toes at bedtime (8pm)  for 12 weeks   Yes [provider]  cefdinir (OMNICEF) 300 MG capsule Take 1 capsule (300 mg total) by mouth every 12 (twelve) hours. 2 more days Patient not taking: Reported on 04/29/2018 12/26/17   Monica Becton, MD  simvastatin (ZOCOR) 40 MG tablet Take 1 tablet (40 mg total) by mouth at bedtime. Patient not taking: Reported on 04/29/2018 11/10/12   Martinique, Peter M, MD  Probiotic Product (ALIGN PO) Take 1 capsule by mouth daily.    02/19/11  [provider]    Family History Family History  Problem Relation Age of Onset  . Heart attack Father     Social History Social History   Tobacco Use  . Smoking status: Never Smoker  . Smokeless tobacco: Never Used  Substance Use Topics  . Alcohol use: No  . Drug use: No     Allergies   Diclofenac sodium;  Duragesic-100 [fentanyl]; Hytrin [terazosin hcl]; Ibuprofen; Norvasc [amlodipine besylate]; and Plavix [clopidogrel bisulfate]   Review of Systems Review of Systems  Unable to perform ROS: Dementia     Physical Exam Updated Vital Signs BP (!) 183/76   Pulse (!) 58   Temp (!) 97.5 F (36.4 C) (Oral)   Resp 11   Ht 1.626 m (5\' 4" )   Wt 72.6 kg   SpO2 100%   BMI 27.46 kg/m   Physical Exam Vitals signs and nursing note reviewed.  Constitutional:      General: She is not in acute distress.    Appearance: Normal appearance.  HENT:     Head: Normocephalic and atraumatic.     Mouth/Throat:     Mouth: Mucous membranes are moist.  Eyes:  Extraocular Movements: Extraocular movements intact.     Pupils: Pupils are equal, round, and reactive to light.  Neck:     Musculoskeletal: Neck supple.  Cardiovascular:     Rate and Rhythm: Normal rate and regular rhythm.  Pulmonary:     Effort: Pulmonary effort is normal.     Breath sounds: Normal breath sounds.  Chest:     Chest wall: No tenderness.  Abdominal:     General: Bowel sounds are normal.     Palpations: Abdomen is soft.     Tenderness: There is no abdominal tenderness.  Musculoskeletal:     Right lower leg: Edema present.     Left lower leg: Edema present.     Comments: Posterior aspect of the left heel with then open ulcer.  No significant erythema.  Ulcers measuring about 5 cm x 2 cm.  No crepitance.  Good dorsalis pedis pulse good cap refill.  Some fat is exposed on the ulcer no tendons exposed.  Skin:    General: Skin is warm.  Neurological:     General: No focal deficit present.     Mental Status: She is alert. Mental status is at baseline.      ED Treatments / Results  Labs (all labs ordered are listed, but only abnormal results are displayed) Labs Reviewed  CBC WITH DIFFERENTIAL/PLATELET - Abnormal; Notable for the following components:      Result Value   RDW 16.1 (*)    Eosinophils Absolute 0.6 (*)      All other components within normal limits  BASIC METABOLIC PANEL - Abnormal; Notable for the following components:   Glucose, Bld 114 (*)    All other components within normal limits    EKG None  Radiology Ct Head Wo Contrast  Result Date: 04/29/2018 CLINICAL DATA:  Unwitnessed fall. Dementia. EXAM: CT HEAD WITHOUT CONTRAST TECHNIQUE: Contiguous axial images were obtained from the base of the skull through the vertex without intravenous contrast. COMPARISON:  12/07/2017. FINDINGS: Brain: Stable moderately enlarged ventricles and subarachnoid spaces. Stable moderate patchy white matter low density in both cerebral hemispheres. No intracranial hemorrhage, mass lesion or CT evidence of acute infarction. Vascular: No hyperdense vessel or unexpected calcification. Skull: Stable small right parietal skull defect. No acute fracture. Sinuses/Orbits: The left sphenoid sinus remains completely opacified with resolved mucosal thickening in the sphenoid sinus on the right. Status post bilateral ethmoidectomy with residual mucosal thickening in the ethmoid regions bilaterally. The maxillary sinuses are small with diffuse bony thickening and anterior wall defects. Status post bilateral cataract extraction. Other: None. IMPRESSION: 1. No acute abnormality. 2. Stable atrophy and chronic small vessel white matter ischemic changes. 3. Chronic sinusitis with some improvement. Electronically Signed   By: Claudie Revering M.D.   On: 04/29/2018 17:35   Dg Foot Complete Left  Result Date: 04/29/2018 CLINICAL DATA:  84 y/o  F; unwitnessed fall. Wound of the left heel. EXAM: LEFT FOOT - COMPLETE 3+ VIEW COMPARISON:  None. FINDINGS: No acute fracture or dislocation identified. Intertarsal osteoarthrosis with sclerosis of the articular surfaces and osteophytosis. Dorsal and plantar calcaneal bone spurs. No radiopaque foreign body of soft tissues identified. Vascular calcifications. IMPRESSION: No acute fracture or dislocation  identified. Electronically Signed   By: Kristine Garbe M.D.   On: 04/29/2018 18:55   Dg Hips Bilat W Or Wo Pelvis 3-4 Views  Result Date: 04/29/2018 CLINICAL DATA:  Unwitnessed fall. EXAM: DG HIP (WITH OR WITHOUT PELVIS) 3-4V BILAT COMPARISON:  None. FINDINGS:  The patient is status post left hip replacement. Hardware is in good position. No acute hip fracture. IMPRESSION: No acute fracture or dislocation identified. Electronically Signed   By: Dorise Bullion III M.D   On: 04/29/2018 18:56    Procedures Procedures (including critical care time)  Medications Ordered in ED Medications - No data to display   Initial Impression / Assessment and Plan / ED Course  I have reviewed the triage vital signs and the nursing notes.  Pertinent labs & imaging results that were available during my care of the patient were reviewed by me and considered in my medical decision making (see chart for details).     Work-up for the fall CT head x-ray of pelvis and both hips without any bony abnormalities.  X-ray of the left foot shows no osteomyelitis or any injury or fracture to the left foot.  No soft tissue gas.  That wound seems to be a chronic heel ulcer.  Recommend follow-up with wound care clinic from the nursing facility.  Patient without evidence of any acute injuries from the unwitnessed fall earlier today.  Patient stable to return back to nursing facility.  Final Clinical Impressions(s) / ED Diagnoses   Final diagnoses:  Fall, initial encounter  Chronic ulcer of left heel with fat layer exposed [facility])    ED Discharge Orders    None       Fredia Sorrow, MD 04/29/18 2147

## 2018-04-29 NOTE — ED Triage Notes (Signed)
Pt brought in via [facility] from [facility] after unwitnessed fall. Hx: dementia, left hip replacement, DM, PAF (eliquis), pacemaker (medtronic) Currently c/o left hip and left heel pain.  No obvious deformities/trauma noted. CBG on [facility] arrival 201, VSS

## 2018-04-29 NOTE — Discharge Instructions (Signed)
Work-up for the fall without any acute findings.  No evidence of any fracture to the foot no evidence of osteomyelitis to the left foot.  There is still the chronic ulcer there would consider recommending consultation with wound care clinic.  Head CT was negative x-rays of both hips and pelvis was also negative.  They are chronic ulcer to the left foot without any evidence of significant cellulitis no involvement with the bone.

## 2018-04-30 NOTE — Consult Note (Signed)
[facility] Nurse wound consult note The ED Secretary informed me that the patient left and "[facility] took her home".  Val Riles, RN, MSN, CWOCN, CNS-BC, pager [phone]

## 2018-05-04 DIAGNOSIS — L89620 Pressure ulcer of left heel, unstageable: Secondary | ICD-10-CM | POA: Diagnosis not present

## 2018-05-11 DIAGNOSIS — L89620 Pressure ulcer of left heel, unstageable: Secondary | ICD-10-CM | POA: Diagnosis not present

## 2018-05-18 DIAGNOSIS — L89623 Pressure ulcer of left heel, stage 3: Secondary | ICD-10-CM | POA: Diagnosis not present

## 2018-05-23 DIAGNOSIS — G309 Alzheimer's disease, unspecified: Secondary | ICD-10-CM | POA: Diagnosis not present

## 2018-05-23 DIAGNOSIS — I7389 Other specified peripheral vascular diseases: Secondary | ICD-10-CM | POA: Diagnosis not present

## 2018-05-23 DIAGNOSIS — L89620 Pressure ulcer of left heel, unstageable: Secondary | ICD-10-CM | POA: Diagnosis not present

## 2018-05-23 DIAGNOSIS — R400 Somnolence: Secondary | ICD-10-CM | POA: Diagnosis not present

## 2018-05-23 DIAGNOSIS — Z7901 Long term (current) use of anticoagulants: Secondary | ICD-10-CM | POA: Diagnosis not present

## 2018-05-23 DIAGNOSIS — F0281 Dementia in other diseases classified elsewhere with behavioral disturbance: Secondary | ICD-10-CM | POA: Diagnosis not present

## 2018-05-23 DIAGNOSIS — I480 Paroxysmal atrial fibrillation: Secondary | ICD-10-CM | POA: Diagnosis not present

## 2018-05-25 ENCOUNTER — Non-Acute Institutional Stay: Payer: PPO | Admitting: Primary Care

## 2018-05-25 DIAGNOSIS — Z515 Encounter for palliative care: Secondary | ICD-10-CM | POA: Diagnosis not present

## 2018-05-25 DIAGNOSIS — E119 Type 2 diabetes mellitus without complications: Secondary | ICD-10-CM

## 2018-05-25 DIAGNOSIS — I5032 Chronic diastolic (congestive) heart failure: Secondary | ICD-10-CM

## 2018-05-25 DIAGNOSIS — L89623 Pressure ulcer of left heel, stage 3: Secondary | ICD-10-CM | POA: Diagnosis not present

## 2018-05-25 NOTE — Progress Notes (Signed)
[facility] Telephone: [phone] Fax: [phone]  PATIENT NAME: [name] DOB: [birth year] MRN: [id number]  PRIMARY CARE PROVIDER:   Bevelyn Buckles, MD  REFERRING PROVIDER: Leanna Battles, MD [place] [address]  RESPONSIBLE PARTY:    Extended Emergency Contact Information Primary Emergency Contact: Aheron,Helen Address: [address]          [place], [place] [address] [place] Phone: [phone] Mobile Phone: [phone] Relation: Niece Secondary Emergency Contact: [place] Mobile Phone: [phone] Relation: Niece  ASSESSMENT and RECOMMENDATIONS:   1. Wound care: Heel needs offloading of pressure to heal. Has a boot but can't be secured, needs new one ordered.  Care to left heel wound ongoing. Instructed nursing staff to offload pressure to heel.   2. Memory Loss/ Dementia: Progressing, less interactive. Increasing decline. May benefit from Increased interaction with activities offered in facility. Has to be fed at meals.   3. Goals of care : Has DNR. Hospice appropriate at this time due to continued loss of function and poor intake if family would like service. Recently moved from [facility] to SNF due to decline and debility. Call put in to [place], [place]. VM left. Message also left with SNF to apprise.   I spent 25 minutes providing this consultation,  from 1300 to 1325. More than 50% of the time in this consultation was spent coordinating communication.   HISTORY OF PRESENT ILLNESS:  Keylah Darwish is a 85 y.o. year old female with multiple medical problems including HTN, CAD, DM2, Hypothyroid, dementia, non healing wound of foot.  Palliative Care was asked to help address goals of care.   CODE STATUS: DNR  PPS: 30% HOSPICE ELIGIBILITY/DIAGNOSIS: yes/dementia PAST MEDICAL HISTORY:  Past Medical History:  Diagnosis Date  . Atrial fibrillation ([place])   . CAD (coronary artery disease)   . Colitis    . Colon cancer ([place])   . Diverticulosis of colon   . DJD (degenerative joint disease)   . DM (diabetes mellitus) ([place])   . Hip fracture ([place])   . HLD (hyperlipidemia)   . HTN (hypertension)   . Hypothyroidism   . Mobitz (type) II atrioventricular block   . Ventral hernia     SOCIAL HX:  Social History   Tobacco Use  . Smoking status: Never Smoker  . Smokeless tobacco: Never Used  Substance Use Topics  . Alcohol use: No    ALLERGIES:  Allergies  Allergen Reactions  . Diclofenac Sodium Other (See Comments)    Noted to be "allergic," per MAR  . Duragesic-100 [Fentanyl] Other (See Comments)    Noted to be "allergic," per MAR  . Hytrin [Terazosin Hcl] Other (See Comments)    Noted to be "allergic," per MAR   . Ibuprofen Other (See Comments)    Noted to be "allergic," per MAR  . Norvasc [Amlodipine Besylate] Other (See Comments)    Noted to be "allergic," per MAR  . Plavix [Clopidogrel Bisulfate] Other (See Comments)    Noted to be "allergic," per [facility]      PERTINENT MEDICATIONS:  Outpatient Encounter Medications as of 05/25/2018  Medication Sig  . acetaminophen (TYLENOL) 325 MG tablet Take 2 tablets (650 mg total) by mouth every 6 (six) hours as needed.  . cefdinir (OMNICEF) 300 MG capsule Take 1 capsule (300 mg total) by mouth every 12 (twelve) hours. 2 more days (Patient not taking: Reported on 04/29/2018)  . divalproex (DEPAKOTE SPRINKLE) 125 MG capsule Take 125  mg by mouth 2 (two) times daily.  Marland Kitchen ELIQUIS 5 MG TABS tablet TAKE 1 TABLET BY MOUTH TWICE DAILY (Patient taking differently: Take 5 mg by mouth 2 (two) times daily. )  . escitalopram (LEXAPRO) 5 MG tablet Take 5 mg by mouth daily.  . ferrous sulfate 325 (65 FE) MG tablet Take 1 tablet (325 mg total) by mouth every 12 (twelve) hours. (Patient taking differently: Take 325 mg by mouth 2 (two) times daily. )  . furosemide (LASIX) 40 MG tablet Take 1 tablet (40 mg total) by mouth daily.  Marland Kitchen gabapentin (NEURONTIN) 300  MG capsule Take 300 mg by mouth at bedtime.  . insulin aspart (NOVOLOG) 100 UNIT/ML injection Inject 4 Units into the skin See admin instructions. Every day before lunch for 2 weeks  . levothyroxine (SYNTHROID, LEVOTHROID) 100 MCG tablet Take 100 mcg by mouth daily.   . magnesium hydroxide (MILK OF MAGNESIA) 400 MG/5ML suspension Take 30 mLs by mouth daily as needed for mild constipation.  . Multiple Vitamins-Minerals (MULTIVITAMIN PO) Take 1 tablet by mouth daily.  . polyethylene glycol (MIRALAX / GLYCOLAX) packet Take 17 g by mouth daily.  . potassium chloride SA (K-DUR,KLOR-CON) 20 MEQ tablet Take 1 tablet (20 mEq total) by mouth daily.  Marland Kitchen senna-docusate (SENEXON-S) 8.6-50 MG tablet Take 1 tablet by mouth daily.  . simvastatin (ZOCOR) 40 MG tablet Take 1 tablet (40 mg total) by mouth at bedtime. (Patient not taking: Reported on 04/29/2018)  . UNABLE TO FIND mentholatrm ; Apply to toes at bedtime (8pm)  for 12 weeks  . [DISCONTINUED] Probiotic Product (ALIGN PO) Take 1 capsule by mouth daily.     No facility-administered encounter medications on file as of 05/25/2018.     PHYSICAL EXAM:  Vs 176/88 98.6-70-18 95%  30 in =circ. Arm   General: NAD, frail appearing, thin, minimally interactive, oriented x 1-2. Cardiovascular: regular rate and rhythm, S1S2 Pulmonary: clear all fields Abdomen: soft, nontender, + bowel sounds, denies constipation GU: no suprapubic tenderness, incontinence Extremities: 2+ edema, wound left heel, legs painful Skin: no rashes, wound as above. Neurological: Weakness, flat affect, memory loss, FAST 7a  Cyndia Skeeters DNP, AGPCNP-BC

## 2018-06-01 DIAGNOSIS — L89623 Pressure ulcer of left heel, stage 3: Secondary | ICD-10-CM | POA: Diagnosis not present

## 2018-06-08 DIAGNOSIS — L89623 Pressure ulcer of left heel, stage 3: Secondary | ICD-10-CM | POA: Diagnosis not present

## 2018-06-11 DIAGNOSIS — R319 Hematuria, unspecified: Secondary | ICD-10-CM | POA: Diagnosis not present

## 2018-06-11 DIAGNOSIS — N390 Urinary tract infection, site not specified: Secondary | ICD-10-CM | POA: Diagnosis not present

## 2018-06-11 DIAGNOSIS — Z79899 Other long term (current) drug therapy: Secondary | ICD-10-CM | POA: Diagnosis not present

## 2018-06-11 DIAGNOSIS — R4182 Altered mental status, unspecified: Secondary | ICD-10-CM | POA: Diagnosis not present

## 2018-06-15 DIAGNOSIS — L89623 Pressure ulcer of left heel, stage 3: Secondary | ICD-10-CM | POA: Diagnosis not present

## 2018-06-22 DIAGNOSIS — L89623 Pressure ulcer of left heel, stage 3: Secondary | ICD-10-CM | POA: Diagnosis not present

## 2018-06-29 DIAGNOSIS — L89623 Pressure ulcer of left heel, stage 3: Secondary | ICD-10-CM | POA: Diagnosis not present

## 2018-07-06 DIAGNOSIS — L89623 Pressure ulcer of left heel, stage 3: Secondary | ICD-10-CM | POA: Diagnosis not present

## 2018-07-06 DIAGNOSIS — I480 Paroxysmal atrial fibrillation: Secondary | ICD-10-CM | POA: Diagnosis not present

## 2018-07-06 DIAGNOSIS — G309 Alzheimer's disease, unspecified: Secondary | ICD-10-CM | POA: Diagnosis not present

## 2018-07-06 DIAGNOSIS — M79605 Pain in left leg: Secondary | ICD-10-CM | POA: Diagnosis not present

## 2018-07-06 DIAGNOSIS — R293 Abnormal posture: Secondary | ICD-10-CM | POA: Diagnosis not present

## 2018-07-12 DIAGNOSIS — G309 Alzheimer's disease, unspecified: Secondary | ICD-10-CM | POA: Diagnosis not present

## 2018-07-12 DIAGNOSIS — F0281 Dementia in other diseases classified elsewhere with behavioral disturbance: Secondary | ICD-10-CM | POA: Diagnosis not present

## 2018-07-12 DIAGNOSIS — I5032 Chronic diastolic (congestive) heart failure: Secondary | ICD-10-CM | POA: Diagnosis not present

## 2018-07-12 DIAGNOSIS — M79605 Pain in left leg: Secondary | ICD-10-CM | POA: Diagnosis not present

## 2018-07-12 DIAGNOSIS — M6281 Muscle weakness (generalized): Secondary | ICD-10-CM | POA: Diagnosis not present

## 2018-07-12 DIAGNOSIS — R2681 Unsteadiness on feet: Secondary | ICD-10-CM | POA: Diagnosis not present

## 2018-07-12 DIAGNOSIS — R278 Other lack of coordination: Secondary | ICD-10-CM | POA: Diagnosis not present

## 2018-07-12 DIAGNOSIS — R293 Abnormal posture: Secondary | ICD-10-CM | POA: Diagnosis not present

## 2018-07-13 DIAGNOSIS — L89623 Pressure ulcer of left heel, stage 3: Secondary | ICD-10-CM | POA: Diagnosis not present

## 2018-07-20 DIAGNOSIS — L89623 Pressure ulcer of left heel, stage 3: Secondary | ICD-10-CM | POA: Diagnosis not present

## 2018-07-21 ENCOUNTER — Other Ambulatory Visit: Payer: Self-pay

## 2018-07-21 ENCOUNTER — Non-Acute Institutional Stay: Payer: PPO | Admitting: Adult Health Nurse Practitioner

## 2018-07-21 DIAGNOSIS — Z515 Encounter for palliative care: Secondary | ICD-10-CM | POA: Diagnosis not present

## 2018-07-21 NOTE — Progress Notes (Signed)
[facility] Consult Note Telephone: [phone]  Fax: [phone]  PATIENT NAME: [name] DOB: 1916-09-16 MRN: [id number]  PRIMARY CARE PROVIDER:   Leanna Battles, MD  REFERRING PROVIDER:  Leanna Battles, MD [place], [place] [address]  RESPONSIBLE PARTY:   Extended Emergency Contact Information Primary Emergency Contact: Aheron,Helen Address: [address]          [place], [place] [address] [place] Phone: [phone] Mobile Phone: [phone] Relation: Niece Secondary Emergency Contact: [place] Mobile Phone: [phone] Relation: Niece        RECOMMENDATIONS and PLAN:  1.  Wound care: Wound to left heel is almost resolved.  Staff reports that they have discontinued wound care and are continuing to apply skin prep to keep it protected.  Will need continued precautions such as floating heels as well as the skin prep to keep it from reopening  2. Memory Loss/ Dementia: Progressing, less interactive. though less interactive, staff does not have any new concerns indicating any further decline at this time.  Today she is waiting in line at hairdresser to have her hair done.  Patient is wheelchair/bed bound.  Incontinent of B&B.  3. Weight loss.  Patient has had a 18 pound weight loss since January. In January 2020 she weighed 172 as of 07/20/2018 she weights 155.  Staff does report that she has been eating more nutritional supplements and does not eat very much at meals.  This is not new for the patient.  She is getting enough nutrition to heal her wound.  Labs in July 2019 showed an albumin of 3.5 and protein of 7.3 and on 05/20/2018 albumin 3.2, protien 6.8, and prealbumin 13  3. Goals of care : Has DNR. Hospice appropriate at this time due to continued loss of function and poor intake if family would like service. Despite wound healing, patient still hospice eligible with continued weight  loss and decline.  Left message with POA, [name].  Spoke with DON at facility, who is going to gather more information and get back with provider.  Confirmed contact info for POA.  I spent 30 minutes providing this consultation,  from 9:30 to 10:00. More than 50% of the time in this consultation was spent coordinating communication.   HISTORY OF PRESENT ILLNESS:  [name] is a 85 y.o. year old female with multiple medical problems including HTN, CAD, DM2, Hypothyroid, dementia. Palliative Care was asked to help address goals of care.   CODE STATUS: DNR  PPS: 30% HOSPICE ELIGIBILITY/DIAGNOSIS: yes/dementia  PHYSICAL EXAM:   General: NAD, frail appearing, thin Cardiovascular: regular rate and rhythm Pulmonary:lung sounds clear, normal respiratory effort Abdomen: soft, nontender, + bowel sounds GU: no suprapubic tenderness, incontinent Extremities: trace edema Neurological: Weakness, patient nonverbal today, FAST 7c   PAST MEDICAL HISTORY:  Past Medical History:  Diagnosis Date  . Atrial fibrillation ([place])   . CAD (coronary artery disease)   . Colitis   . Colon cancer ([place])   . Diverticulosis of colon   . DJD (degenerative joint disease)   . DM (diabetes mellitus) ([place])   . Hip fracture ([place])   . HLD (hyperlipidemia)   . HTN (hypertension)   . Hypothyroidism   . Mobitz (type) II atrioventricular block   . Ventral hernia     SOCIAL HX:  Social History   Tobacco Use  . Smoking status: Never Smoker  . Smokeless tobacco: Never Used  Substance Use Topics  .  Alcohol use: No    ALLERGIES:  Allergies  Allergen Reactions  . Diclofenac Sodium Other (See Comments)    Noted to be "allergic," per MAR  . Duragesic-100 [Fentanyl] Other (See Comments)    Noted to be "allergic," per MAR  . Hytrin [Terazosin Hcl] Other (See Comments)    Noted to be "allergic," per MAR   . Ibuprofen Other (See Comments)    Noted to be "allergic," per MAR  . Norvasc [Amlodipine Besylate]  Other (See Comments)    Noted to be "allergic," per MAR  . Plavix [Clopidogrel Bisulfate] Other (See Comments)    Noted to be "allergic," per [facility]      PERTINENT MEDICATIONS:  Outpatient Encounter Medications as of 07/21/2018  Medication Sig  . acetaminophen (TYLENOL) 325 MG tablet Take 2 tablets (650 mg total) by mouth every 6 (six) hours as needed.  . cefdinir (OMNICEF) 300 MG capsule Take 1 capsule (300 mg total) by mouth every 12 (twelve) hours. 2 more days (Patient not taking: Reported on 04/29/2018)  . divalproex (DEPAKOTE SPRINKLE) 125 MG capsule Take 125 mg by mouth 2 (two) times daily.  Marland Kitchen ELIQUIS 5 MG TABS tablet TAKE 1 TABLET BY MOUTH TWICE DAILY (Patient taking differently: Take 5 mg by mouth 2 (two) times daily. )  . escitalopram (LEXAPRO) 5 MG tablet Take 5 mg by mouth daily.  . ferrous sulfate 325 (65 FE) MG tablet Take 1 tablet (325 mg total) by mouth every 12 (twelve) hours. (Patient taking differently: Take 325 mg by mouth 2 (two) times daily. )  . furosemide (LASIX) 40 MG tablet Take 1 tablet (40 mg total) by mouth daily.  Marland Kitchen gabapentin (NEURONTIN) 300 MG capsule Take 300 mg by mouth at bedtime.  . insulin aspart (NOVOLOG) 100 UNIT/ML injection Inject 4 Units into the skin See admin instructions. Every day before lunch for 2 weeks  . levothyroxine (SYNTHROID, LEVOTHROID) 100 MCG tablet Take 100 mcg by mouth daily.   . magnesium hydroxide (MILK OF MAGNESIA) 400 MG/5ML suspension Take 30 mLs by mouth daily as needed for mild constipation.  . Multiple Vitamins-Minerals (MULTIVITAMIN PO) Take 1 tablet by mouth daily.  . polyethylene glycol (MIRALAX / GLYCOLAX) packet Take 17 g by mouth daily.  . potassium chloride SA (K-DUR,KLOR-CON) 20 MEQ tablet Take 1 tablet (20 mEq total) by mouth daily.  Marland Kitchen senna-docusate (SENEXON-S) 8.6-50 MG tablet Take 1 tablet by mouth daily.  . simvastatin (ZOCOR) 40 MG tablet Take 1 tablet (40 mg total) by mouth at bedtime. (Patient not taking: Reported  on 04/29/2018)  . UNABLE TO FIND mentholatrm ; Apply to toes at bedtime (8pm)  for 12 weeks  . [DISCONTINUED] Probiotic Product (ALIGN PO) Take 1 capsule by mouth daily.     No facility-administered encounter medications on file as of 07/21/2018.      Shantell Belongia Jenetta Downer, NP

## 2018-07-27 DIAGNOSIS — L89623 Pressure ulcer of left heel, stage 3: Secondary | ICD-10-CM | POA: Diagnosis not present

## 2018-08-02 ENCOUNTER — Other Ambulatory Visit: Payer: Self-pay

## 2018-08-02 ENCOUNTER — Non-Acute Institutional Stay: Payer: PPO | Admitting: Adult Health Nurse Practitioner

## 2018-08-02 DIAGNOSIS — Z515 Encounter for palliative care: Secondary | ICD-10-CM

## 2018-08-02 NOTE — Progress Notes (Signed)
[name] Consult Note Telephone: [phone]  Fax: [phone]  PATIENT NAME: [name] DOB: [birth year] MRN: [id number]  PRIMARY CARE PROVIDER:   Leanna Battles, MD  REFERRING PROVIDER:  Leanna Battles, MD [name], [name] [address]  RESPONSIBLE PARTY:   Extended Emergency Contact Information Primary Emergency Contact: [name] Address: [address] [place], [place] [address] [name] of [place] Phone: [phone] Mobile Phone: [phone] Relation: Niece Secondary Emergency Contact: [name] Mobile Phone: [phone] Relation: Niece     RECOMMENDATIONS and PLAN:  1. Memory Loss/Dementia: Progressing, less interactive.Today patient is conversing with provider. Staff does report that she was tearful this morning but by lunch time she was smiling and talkative.  Staff does report that she will have periods of a few days at a time in which she is more withdrawn and not talking.  Patient is wheelchair/bed bound.  Incontinent of B&B.  3. Weight loss.  Staff reports that she has been eating fine as long as staff feeds her. No reported weight loss over the past 2 weeks. (Labs in July 2019 showed an albumin of 3.5 and protein of 7.3 and on 05/20/2018 albumin 3.2, protien 6.8, and prealbumin 13)  3. Goals of care : Has DNR.Hospice appropriate at this time due to continued loss of function and poor intakeif family would like service. Patient appears to be doing well at this time.  Left message with niece, [name]. Continue to monitor for any more weight loss or physical decline.  I spent 20 minutes providing this consultation,  from 2:30 to 2:50. More than 50% of the time in this consultation was spent coordinating communication.   HISTORY OF PRESENT ILLNESS:  [name] is a 85 y.o. year old female with multiple medical problems including HTN, CAD, DM2, Hypothyroid, dementia.  Palliative Care was asked to help address goals of care.   CODE STATUS: DNR  PPS: 30% HOSPICE ELIGIBILITY/DIAGNOSIS: yes/dementia  PHYSICAL EXAM:   General: NAD, frail appearing, thin Cardiovascular: regular rate and rhythm Pulmonary:lung sounds clear, normal respiratory effort Abdomen: soft, nontender, + bowel sounds GU: no suprapubic tenderness, incontinent Extremities: trace edema Neurological: Weakness, patient nonverbal today, FAST 7c  Though her speech today was with few word replies to questions that she appropriately answered  PAST MEDICAL HISTORY:  Past Medical History:  Diagnosis Date   Atrial fibrillation (HCC)    CAD (coronary artery disease)    Colitis    Colon cancer (HCC)    Diverticulosis of colon    DJD (degenerative joint disease)    DM (diabetes mellitus) ([place])    Hip fracture (HCC)    HLD (hyperlipidemia)    HTN (hypertension)    Hypothyroidism    Mobitz (type) II atrioventricular block    Ventral hernia     SOCIAL HX:  Social History   Tobacco Use   Smoking status: Never Smoker   Smokeless tobacco: Never Used  Substance Use Topics   Alcohol use: No    ALLERGIES:  Allergies  Allergen Reactions   Diclofenac Sodium Other (See Comments)    Noted to be "allergic," per [facility]   Duragesic-100 [Fentanyl] Other (See Comments)    Noted to be "allergic," per [facility]   Hytrin [Terazosin Hcl] Other (See Comments)    Noted to be "allergic," per [facility]    Ibuprofen Other (See Comments)    Noted to be "allergic," per [facility]   Norvasc [Amlodipine Besylate] Other (See Comments)  Noted to be "allergic," per [facility]   Plavix [Clopidogrel Bisulfate] Other (See Comments)    Noted to be "allergic," per [facility]      PERTINENT MEDICATIONS:  Outpatient Encounter Medications as of 08/02/2018  Medication Sig   acetaminophen (TYLENOL) 325 MG tablet Take 2 tablets (650 mg total) by mouth every 6 (six) hours as needed.   cefdinir (OMNICEF) 300 MG capsule  Take 1 capsule (300 mg total) by mouth every 12 (twelve) hours. 2 more days (Patient not taking: Reported on 04/29/2018)   divalproex (DEPAKOTE SPRINKLE) 125 MG capsule Take 125 mg by mouth 2 (two) times daily.   ELIQUIS 5 MG TABS tablet TAKE 1 TABLET BY MOUTH TWICE DAILY (Patient taking differently: Take 5 mg by mouth 2 (two) times daily. )   escitalopram (LEXAPRO) 5 MG tablet Take 5 mg by mouth daily.   ferrous sulfate 325 (65 FE) MG tablet Take 1 tablet (325 mg total) by mouth every 12 (twelve) hours. (Patient taking differently: Take 325 mg by mouth 2 (two) times daily. )   furosemide (LASIX) 40 MG tablet Take 1 tablet (40 mg total) by mouth daily.   gabapentin (NEURONTIN) 300 MG capsule Take 300 mg by mouth at bedtime.   insulin aspart (NOVOLOG) 100 UNIT/ML injection Inject 4 Units into the skin See admin instructions. Every day before lunch for 2 weeks   levothyroxine (SYNTHROID, LEVOTHROID) 100 MCG tablet Take 100 mcg by mouth daily.    magnesium hydroxide (MILK OF MAGNESIA) 400 MG/5ML suspension Take 30 mLs by mouth daily as needed for mild constipation.   Multiple Vitamins-Minerals (MULTIVITAMIN PO) Take 1 tablet by mouth daily.   polyethylene glycol (MIRALAX / GLYCOLAX) packet Take 17 g by mouth daily.   potassium chloride SA (K-DUR,KLOR-CON) 20 MEQ tablet Take 1 tablet (20 mEq total) by mouth daily.   senna-docusate (SENEXON-S) 8.6-50 MG tablet Take 1 tablet by mouth daily.   simvastatin (ZOCOR) 40 MG tablet Take 1 tablet (40 mg total) by mouth at bedtime. (Patient not taking: Reported on 04/29/2018)   UNABLE TO FIND mentholatrm ; Apply to toes at bedtime (8pm)  for 12 weeks   [DISCONTINUED] Probiotic Product (ALIGN PO) Take 1 capsule by mouth daily.     No facility-administered encounter medications on file as of 08/02/2018.      [name] Jenetta Downer, NP

## 2018-08-06 DIAGNOSIS — R05 Cough: Secondary | ICD-10-CM | POA: Diagnosis not present

## 2018-08-08 DIAGNOSIS — E1151 Type 2 diabetes mellitus with diabetic peripheral angiopathy without gangrene: Secondary | ICD-10-CM | POA: Diagnosis not present

## 2018-08-08 DIAGNOSIS — R05 Cough: Secondary | ICD-10-CM | POA: Diagnosis not present

## 2018-08-08 DIAGNOSIS — G309 Alzheimer's disease, unspecified: Secondary | ICD-10-CM | POA: Diagnosis not present

## 2018-08-08 DIAGNOSIS — I480 Paroxysmal atrial fibrillation: Secondary | ICD-10-CM | POA: Diagnosis not present

## 2018-08-09 DIAGNOSIS — D649 Anemia, unspecified: Secondary | ICD-10-CM | POA: Diagnosis not present

## 2018-08-09 DIAGNOSIS — E119 Type 2 diabetes mellitus without complications: Secondary | ICD-10-CM | POA: Diagnosis not present

## 2018-08-09 DIAGNOSIS — J189 Pneumonia, unspecified organism: Secondary | ICD-10-CM | POA: Diagnosis not present

## 2018-08-09 DIAGNOSIS — R4182 Altered mental status, unspecified: Secondary | ICD-10-CM | POA: Diagnosis not present

## 2018-08-09 DIAGNOSIS — E039 Hypothyroidism, unspecified: Secondary | ICD-10-CM | POA: Diagnosis not present

## 2018-08-11 DIAGNOSIS — M79605 Pain in left leg: Secondary | ICD-10-CM | POA: Diagnosis not present

## 2018-08-11 DIAGNOSIS — F0281 Dementia in other diseases classified elsewhere with behavioral disturbance: Secondary | ICD-10-CM | POA: Diagnosis not present

## 2018-08-11 DIAGNOSIS — I5032 Chronic diastolic (congestive) heart failure: Secondary | ICD-10-CM | POA: Diagnosis not present

## 2018-08-11 DIAGNOSIS — R2681 Unsteadiness on feet: Secondary | ICD-10-CM | POA: Diagnosis not present

## 2018-08-11 DIAGNOSIS — L89623 Pressure ulcer of left heel, stage 3: Secondary | ICD-10-CM | POA: Diagnosis not present

## 2018-08-11 DIAGNOSIS — M6281 Muscle weakness (generalized): Secondary | ICD-10-CM | POA: Diagnosis not present

## 2018-08-11 DIAGNOSIS — R278 Other lack of coordination: Secondary | ICD-10-CM | POA: Diagnosis not present

## 2018-08-29 DIAGNOSIS — Z7689 Persons encountering health services in other specified circumstances: Secondary | ICD-10-CM | POA: Diagnosis not present

## 2018-09-22 ENCOUNTER — Telehealth: Payer: Self-pay | Admitting: Cardiology

## 2018-09-22 DIAGNOSIS — R293 Abnormal posture: Secondary | ICD-10-CM | POA: Diagnosis not present

## 2018-09-22 DIAGNOSIS — G309 Alzheimer's disease, unspecified: Secondary | ICD-10-CM | POA: Diagnosis not present

## 2018-09-22 DIAGNOSIS — Z9181 History of falling: Secondary | ICD-10-CM | POA: Diagnosis not present

## 2018-09-22 DIAGNOSIS — R2681 Unsteadiness on feet: Secondary | ICD-10-CM | POA: Diagnosis not present

## 2018-09-22 DIAGNOSIS — R278 Other lack of coordination: Secondary | ICD-10-CM | POA: Diagnosis not present

## 2018-09-22 DIAGNOSIS — M79605 Pain in left leg: Secondary | ICD-10-CM | POA: Diagnosis not present

## 2018-09-22 DIAGNOSIS — R1312 Dysphagia, oropharyngeal phase: Secondary | ICD-10-CM | POA: Diagnosis not present

## 2018-09-22 DIAGNOSIS — M6281 Muscle weakness (generalized): Secondary | ICD-10-CM | POA: Diagnosis not present

## 2018-09-22 NOTE — Telephone Encounter (Signed)
Spoke w/ pt niece and she isn't sure if pt still has home monitor. She is going to contact the nursing facility and call me back with an updated.

## 2018-09-23 DIAGNOSIS — Z20828 Contact with and (suspected) exposure to other viral communicable diseases: Secondary | ICD-10-CM | POA: Diagnosis not present

## 2018-09-23 NOTE — Telephone Encounter (Signed)
Pt niece called back with questions. I was able to answer her questions. She is going to call the nursing facility to see if the pt monitor is there.

## 2018-09-28 DIAGNOSIS — Z20828 Contact with and (suspected) exposure to other viral communicable diseases: Secondary | ICD-10-CM | POA: Diagnosis not present

## 2018-09-28 NOTE — Telephone Encounter (Signed)
Attempted to call pt niece. No answer and unable to leave a message.

## 2018-10-03 DIAGNOSIS — E46 Unspecified protein-calorie malnutrition: Secondary | ICD-10-CM | POA: Diagnosis not present

## 2018-10-03 DIAGNOSIS — R197 Diarrhea, unspecified: Secondary | ICD-10-CM | POA: Diagnosis not present

## 2018-10-04 DIAGNOSIS — Z20828 Contact with and (suspected) exposure to other viral communicable diseases: Secondary | ICD-10-CM | POA: Diagnosis not present

## 2018-10-11 DIAGNOSIS — R2681 Unsteadiness on feet: Secondary | ICD-10-CM | POA: Diagnosis not present

## 2018-10-11 DIAGNOSIS — G309 Alzheimer's disease, unspecified: Secondary | ICD-10-CM | POA: Diagnosis not present

## 2018-10-11 DIAGNOSIS — M79605 Pain in left leg: Secondary | ICD-10-CM | POA: Diagnosis not present

## 2018-10-11 DIAGNOSIS — R278 Other lack of coordination: Secondary | ICD-10-CM | POA: Diagnosis not present

## 2018-10-11 DIAGNOSIS — R293 Abnormal posture: Secondary | ICD-10-CM | POA: Diagnosis not present

## 2018-10-11 DIAGNOSIS — R1312 Dysphagia, oropharyngeal phase: Secondary | ICD-10-CM | POA: Diagnosis not present

## 2018-10-11 DIAGNOSIS — Z9181 History of falling: Secondary | ICD-10-CM | POA: Diagnosis not present

## 2018-10-11 DIAGNOSIS — M6281 Muscle weakness (generalized): Secondary | ICD-10-CM | POA: Diagnosis not present

## 2018-10-11 NOTE — Telephone Encounter (Signed)
2nd attempt.   Attempted to call niece. No answer and unable to leave a message.

## 2018-10-13 NOTE — Telephone Encounter (Signed)
3rd attempt  No answer and unable to leave a message.

## 2018-10-15 ENCOUNTER — Encounter: Payer: Self-pay | Admitting: Cardiology

## 2018-10-15 NOTE — Telephone Encounter (Signed)
Letter mailed. Certified letter will need to be sent on 10/29/2018.

## 2018-10-29 ENCOUNTER — Encounter: Payer: Self-pay | Admitting: Cardiology

## 2018-10-29 NOTE — Telephone Encounter (Signed)
Certified letter mailed.

## 2018-11-14 DIAGNOSIS — R456 Violent behavior: Secondary | ICD-10-CM | POA: Diagnosis not present

## 2018-11-14 DIAGNOSIS — E46 Unspecified protein-calorie malnutrition: Secondary | ICD-10-CM | POA: Diagnosis not present

## 2018-11-14 DIAGNOSIS — R400 Somnolence: Secondary | ICD-10-CM | POA: Diagnosis not present

## 2018-11-22 ENCOUNTER — Other Ambulatory Visit: Payer: Self-pay

## 2018-11-22 ENCOUNTER — Non-Acute Institutional Stay: Payer: PPO | Admitting: Adult Health Nurse Practitioner

## 2018-11-22 DIAGNOSIS — Z515 Encounter for palliative care: Secondary | ICD-10-CM

## 2018-11-22 NOTE — Progress Notes (Signed)
[facility] Consult Note Telephone: [phone]  Fax: [phone]  PATIENT NAME: [name] DOB: [birth year] MRN: [id number]  PRIMARY CARE PROVIDER:   Leanna Battles, MD  REFERRING PROVIDER:  Leanna Battles, MD [place],  [place] [address]  RESPONSIBLE PARTY: Extended Emergency Contact Information Primary Emergency Contact: Aheron,Helen Address: [address] [place], [place] [address] [place] of [place] Phone: [phone] Mobile Phone: [phone] Relation: Niece Secondary Emergency Contact: [place] Mobile Phone: [phone] Relation: Niece     Due to the COVID-19 crisis, this visit was done via telemedicine and it was initiated and consent by this patient and or family.  Visit via Zoom with facility SW, Madelin Rear.  Patient with dementia and unable to contribute to assessment.   RECOMMENDATIONS and PLAN:  1.  Dementia. FAST 6d.  Patient requires extensive assistance with care.  Requires assistance with feeding.  She was COVID positive earlier this year but is now COVID negative upon retest.  She had a weight loss while being treated for COVID but staff reports that her appetite has picked up since late June.  She eats 26-75% of meals. LBM 11/21/2018. She does not converse much but is able to communicate needs.  Incontinent of B&B.  Has history of combativeness but is well controlled with lexapro and seroquel.  Is being seen by psych services.  She has wounds and is being taken care of by wound nurse.  She does take Pro-Stat, medpass, and Magic Cup for supplemental nutrition.    2. Goals of care.  Patient is DNR.  Staff reports that she is pretty much back to baseline and her appetite is improving after COVID infection.  Continue supplementation until back to baseline and wounds heal.  Continue recommendations by wound nurse for wound care.      I spent 20 minutes providing this  consultation,  from 2:20 to 2:40. More than 50% of the time in this consultation was spent coordinating communication.   HISTORY OF PRESENT ILLNESS:  [name] is a 85 y.o. year old female with multiple medical problems including HTN, CAD, DM2, Hypothyroid, dementia. Palliative Care was asked to help address goals of care.   CODE STATUS: DNR  PPS: 30% HOSPICE ELIGIBILITY/DIAGNOSIS: TBD  PAST MEDICAL HISTORY:  Past Medical History:  Diagnosis Date  . Atrial fibrillation ([place])   . CAD (coronary artery disease)   . Colitis   . Colon cancer ([place])   . Diverticulosis of colon   . DJD (degenerative joint disease)   . DM (diabetes mellitus) ([place])   . Hip fracture ([place])   . HLD (hyperlipidemia)   . HTN (hypertension)   . Hypothyroidism   . Mobitz (type) II atrioventricular block   . Ventral hernia     SOCIAL HX:  Social History   Tobacco Use  . Smoking status: Never Smoker  . Smokeless tobacco: Never Used  Substance Use Topics  . Alcohol use: No    ALLERGIES:  Allergies  Allergen Reactions  . Diclofenac Sodium Other (See Comments)    Noted to be "allergic," per [facility]  . Duragesic-100 [Fentanyl] Other (See Comments)    Noted to be "allergic," per [facility]  . Hytrin [Terazosin Hcl] Other (See Comments)    Noted to be "allergic," per [facility]   . Ibuprofen Other (See Comments)    Noted to be "allergic," per [facility]  . Norvasc [Amlodipine Besylate] Other (See Comments)    Noted to be "allergic,"  per [facility]  . Plavix [Clopidogrel Bisulfate] Other (See Comments)    Noted to be "allergic," per [facility]      PERTINENT MEDICATIONS:  Outpatient Encounter Medications as of 11/22/2018  Medication Sig  . acetaminophen (TYLENOL) 325 MG tablet Take 2 tablets (650 mg total) by mouth every 6 (six) hours as needed.  . cefdinir (OMNICEF) 300 MG capsule Take 1 capsule (300 mg total) by mouth every 12 (twelve) hours. 2 more days (Patient not taking: Reported on 04/29/2018)  . divalproex (DEPAKOTE SPRINKLE)  125 MG capsule Take 125 mg by mouth 2 (two) times daily.  Marland Kitchen ELIQUIS 5 MG TABS tablet TAKE 1 TABLET BY MOUTH TWICE DAILY (Patient taking differently: Take 5 mg by mouth 2 (two) times daily. )  . escitalopram (LEXAPRO) 5 MG tablet Take 5 mg by mouth daily.  . ferrous sulfate 325 (65 FE) MG tablet Take 1 tablet (325 mg total) by mouth every 12 (twelve) hours. (Patient taking differently: Take 325 mg by mouth 2 (two) times daily. )  . furosemide (LASIX) 40 MG tablet Take 1 tablet (40 mg total) by mouth daily.  Marland Kitchen gabapentin (NEURONTIN) 300 MG capsule Take 300 mg by mouth at bedtime.  . insulin aspart (NOVOLOG) 100 UNIT/ML injection Inject 4 Units into the skin See admin instructions. Every day before lunch for 2 weeks  . levothyroxine (SYNTHROID, LEVOTHROID) 100 MCG tablet Take 100 mcg by mouth daily.   . magnesium hydroxide (MILK OF MAGNESIA) 400 MG/5ML suspension Take 30 mLs by mouth daily as needed for mild constipation.  . Multiple Vitamins-Minerals (MULTIVITAMIN PO) Take 1 tablet by mouth daily.  . polyethylene glycol (MIRALAX / GLYCOLAX) packet Take 17 g by mouth daily.  . potassium chloride SA (K-DUR,KLOR-CON) 20 MEQ tablet Take 1 tablet (20 mEq total) by mouth daily.  Marland Kitchen senna-docusate (SENEXON-S) 8.6-50 MG tablet Take 1 tablet by mouth daily.  . simvastatin (ZOCOR) 40 MG tablet Take 1 tablet (40 mg total) by mouth at bedtime. (Patient not taking: Reported on 04/29/2018)  . UNABLE TO FIND mentholatrm ; Apply to toes at bedtime (8pm)  for 12 weeks  . [DISCONTINUED] Probiotic Product (ALIGN PO) Take 1 capsule by mouth daily.     No facility-administered encounter medications on file as of 11/22/2018.       Zaiyden Strozier Jenetta Downer, NP

## 2018-12-27 ENCOUNTER — Non-Acute Institutional Stay: Payer: PPO | Admitting: Adult Health Nurse Practitioner

## 2018-12-27 DIAGNOSIS — Z515 Encounter for palliative care: Secondary | ICD-10-CM

## 2018-12-27 NOTE — Progress Notes (Addendum)
[facility] Consult Note Telephone: [phone]  Fax: [phone]  PATIENT NAME: [name] DOB: 26-Feb-1917 MRN: [id number]  PRIMARY CARE PROVIDER:   Leanna Battles, MD  REFERRING PROVIDER:  Leanna Battles, MD [place],  [place] [address]  RESPONSIBLE Extended Emergency Contact Information Primary Emergency Contact: [name] Address: [address] [place], [place] [address] Johnnette Litter of [place] Phone: [phone] Mobile Phone: [phone] Relation: Niece Secondary Emergency Contact: [place] Mobile Phone: [phone] Relation: Niece       Due to the COVID-19 crisis, this visit was done via telemedicine and it was initiated and consent by this patient and or family.Visit via Zoom with facility SW, Madelin Rear. Patient with dementia and unable to contribute to assessment.     RECOMMENDATIONS and PLAN:  1.  Advanced care planning.  Patient is a DNR.  Patient is stable at baseline.  No reported infections or hospitalizations since last visit.  Patient stable.  Schedule another appointment in 1-2 months  2.  Dementia. FAST 6d.  Patient requires extensive assistance with care.  Requires assistance with feeding. She eats 25-75% of meals. She does not converse much but is able to communicate needs.  Incontinent of B&B.  Has history of combativeness but is well controlled with lexapro and seroquel.  Is being seen by psych services.  Her wounds have resolved.  She did have a fall on 7/27 with no injury.  Indicated that she was trying to go to the bathroom.  Continue supportive care.   VS reported by staff weight 167  Temp 97.4  BP 152/76  I spent 20 minutes providing this consultation,  from 2:30 to 2:50. More than 50% of the time in this consultation was spent coordinating communication.   HISTORY OF PRESENT ILLNESS:  [name] is a 85 y.o. year old female with multiple medical  problems including HTN, CAD, DM2, Hypothyroid, dementia. Palliative Care was asked to help address goals of care.   CODE STATUS: DNR  PPS: 30% HOSPICE ELIGIBILITY/DIAGNOSIS: TBD  PAST MEDICAL HISTORY:  Past Medical History:  Diagnosis Date  . Atrial fibrillation ([place])   . CAD (coronary artery disease)   . Colitis   . Colon cancer ([place])   . Diverticulosis of colon   . DJD (degenerative joint disease)   . DM (diabetes mellitus) ([place])   . Hip fracture ([place])   . HLD (hyperlipidemia)   . HTN (hypertension)   . Hypothyroidism   . Mobitz (type) II atrioventricular block   . Ventral hernia     SOCIAL HX:  Social History   Tobacco Use  . Smoking status: Never Smoker  . Smokeless tobacco: Never Used  Substance Use Topics  . Alcohol use: No    ALLERGIES:  Allergies  Allergen Reactions  . Diclofenac Sodium Other (See Comments)    Noted to be "allergic," per [facility]  . Duragesic-100 [Fentanyl] Other (See Comments)    Noted to be "allergic," per [facility]  . Hytrin [Terazosin Hcl] Other (See Comments)    Noted to be "allergic," per [facility]   . Ibuprofen Other (See Comments)    Noted to be "allergic," per [facility]  . Norvasc [Amlodipine Besylate] Other (See Comments)    Noted to be "allergic," per [facility]  . Plavix [Clopidogrel Bisulfate] Other (See Comments)    Noted to be "allergic," per [facility]      PERTINENT MEDICATIONS:  Outpatient Encounter Medications as of 12/27/2018  Medication Sig  .  acetaminophen (TYLENOL) 325 MG tablet Take 2 tablets (650 mg total) by mouth every 6 (six) hours as needed.  . cefdinir (OMNICEF) 300 MG capsule Take 1 capsule (300 mg total) by mouth every 12 (twelve) hours. 2 more days (Patient not taking: Reported on 04/29/2018)  . divalproex (DEPAKOTE SPRINKLE) 125 MG capsule Take 125 mg by mouth 2 (two) times daily.  Marland Kitchen ELIQUIS 5 MG TABS tablet TAKE 1 TABLET BY MOUTH TWICE DAILY (Patient taking differently: Take 5 mg by mouth 2 (two) times daily. )  . escitalopram (LEXAPRO)  5 MG tablet Take 5 mg by mouth daily.  . ferrous sulfate 325 (65 FE) MG tablet Take 1 tablet (325 mg total) by mouth every 12 (twelve) hours. (Patient taking differently: Take 325 mg by mouth 2 (two) times daily. )  . furosemide (LASIX) 40 MG tablet Take 1 tablet (40 mg total) by mouth daily.  Marland Kitchen gabapentin (NEURONTIN) 300 MG capsule Take 300 mg by mouth at bedtime.  . insulin aspart (NOVOLOG) 100 UNIT/ML injection Inject 4 Units into the skin See admin instructions. Every day before lunch for 2 weeks  . levothyroxine (SYNTHROID, LEVOTHROID) 100 MCG tablet Take 100 mcg by mouth daily.   . magnesium hydroxide (MILK OF MAGNESIA) 400 MG/5ML suspension Take 30 mLs by mouth daily as needed for mild constipation.  . Multiple Vitamins-Minerals (MULTIVITAMIN PO) Take 1 tablet by mouth daily.  . polyethylene glycol (MIRALAX / GLYCOLAX) packet Take 17 g by mouth daily.  . potassium chloride SA (K-DUR,KLOR-CON) 20 MEQ tablet Take 1 tablet (20 mEq total) by mouth daily.  Marland Kitchen senna-docusate (SENEXON-S) 8.6-50 MG tablet Take 1 tablet by mouth daily.  . simvastatin (ZOCOR) 40 MG tablet Take 1 tablet (40 mg total) by mouth at bedtime. (Patient not taking: Reported on 04/29/2018)  . UNABLE TO FIND mentholatrm ; Apply to toes at bedtime (8pm)  for 12 weeks  . [DISCONTINUED] Probiotic Product (ALIGN PO) Take 1 capsule by mouth daily.     No facility-administered encounter medications on file as of 12/27/2018.         Jenetta Downer, NP

## 2018-12-28 ENCOUNTER — Other Ambulatory Visit: Payer: Self-pay

## 2018-12-30 DIAGNOSIS — E785 Hyperlipidemia, unspecified: Secondary | ICD-10-CM | POA: Diagnosis not present

## 2018-12-30 DIAGNOSIS — D649 Anemia, unspecified: Secondary | ICD-10-CM | POA: Diagnosis not present

## 2018-12-30 DIAGNOSIS — R319 Hematuria, unspecified: Secondary | ICD-10-CM | POA: Diagnosis not present

## 2018-12-30 DIAGNOSIS — N390 Urinary tract infection, site not specified: Secondary | ICD-10-CM | POA: Diagnosis not present

## 2018-12-30 DIAGNOSIS — R0602 Shortness of breath: Secondary | ICD-10-CM | POA: Diagnosis not present

## 2018-12-30 DIAGNOSIS — Z79899 Other long term (current) drug therapy: Secondary | ICD-10-CM | POA: Diagnosis not present

## 2019-01-19 DIAGNOSIS — Z20828 Contact with and (suspected) exposure to other viral communicable diseases: Secondary | ICD-10-CM | POA: Diagnosis not present

## 2019-02-06 DIAGNOSIS — R05 Cough: Secondary | ICD-10-CM | POA: Diagnosis not present

## 2019-02-10 DIAGNOSIS — Z794 Long term (current) use of insulin: Secondary | ICD-10-CM | POA: Diagnosis not present

## 2019-02-10 DIAGNOSIS — B351 Tinea unguium: Secondary | ICD-10-CM | POA: Diagnosis not present

## 2019-02-10 DIAGNOSIS — E1151 Type 2 diabetes mellitus with diabetic peripheral angiopathy without gangrene: Secondary | ICD-10-CM | POA: Diagnosis not present

## 2019-02-23 DIAGNOSIS — Z20828 Contact with and (suspected) exposure to other viral communicable diseases: Secondary | ICD-10-CM | POA: Diagnosis not present

## 2019-03-01 DIAGNOSIS — Z20828 Contact with and (suspected) exposure to other viral communicable diseases: Secondary | ICD-10-CM | POA: Diagnosis not present

## 2019-03-09 DIAGNOSIS — G309 Alzheimer's disease, unspecified: Secondary | ICD-10-CM | POA: Diagnosis not present

## 2019-03-09 DIAGNOSIS — F419 Anxiety disorder, unspecified: Secondary | ICD-10-CM | POA: Diagnosis not present

## 2019-03-09 DIAGNOSIS — F0391 Unspecified dementia with behavioral disturbance: Secondary | ICD-10-CM | POA: Diagnosis not present

## 2019-03-17 DIAGNOSIS — Z794 Long term (current) use of insulin: Secondary | ICD-10-CM | POA: Diagnosis not present

## 2019-03-17 DIAGNOSIS — E119 Type 2 diabetes mellitus without complications: Secondary | ICD-10-CM | POA: Diagnosis not present

## 2019-03-17 DIAGNOSIS — H01005 Unspecified blepharitis left lower eyelid: Secondary | ICD-10-CM | POA: Diagnosis not present

## 2019-03-17 DIAGNOSIS — H01002 Unspecified blepharitis right lower eyelid: Secondary | ICD-10-CM | POA: Diagnosis not present

## 2019-03-21 DIAGNOSIS — D649 Anemia, unspecified: Secondary | ICD-10-CM | POA: Diagnosis not present

## 2019-03-21 DIAGNOSIS — A498 Other bacterial infections of unspecified site: Secondary | ICD-10-CM | POA: Diagnosis not present

## 2019-03-21 DIAGNOSIS — E559 Vitamin D deficiency, unspecified: Secondary | ICD-10-CM | POA: Diagnosis not present

## 2019-03-21 DIAGNOSIS — E785 Hyperlipidemia, unspecified: Secondary | ICD-10-CM | POA: Diagnosis not present

## 2019-03-21 DIAGNOSIS — E119 Type 2 diabetes mellitus without complications: Secondary | ICD-10-CM | POA: Diagnosis not present

## 2019-03-21 DIAGNOSIS — J189 Pneumonia, unspecified organism: Secondary | ICD-10-CM | POA: Diagnosis not present

## 2019-03-21 DIAGNOSIS — E039 Hypothyroidism, unspecified: Secondary | ICD-10-CM | POA: Diagnosis not present

## 2019-03-23 DIAGNOSIS — F0391 Unspecified dementia with behavioral disturbance: Secondary | ICD-10-CM | POA: Diagnosis not present

## 2019-03-23 DIAGNOSIS — F419 Anxiety disorder, unspecified: Secondary | ICD-10-CM | POA: Diagnosis not present

## 2019-03-23 DIAGNOSIS — G309 Alzheimer's disease, unspecified: Secondary | ICD-10-CM | POA: Diagnosis not present

## 2019-03-25 ENCOUNTER — Other Ambulatory Visit: Payer: Self-pay | Admitting: *Deleted

## 2019-04-11 DIAGNOSIS — Z20828 Contact with and (suspected) exposure to other viral communicable diseases: Secondary | ICD-10-CM | POA: Diagnosis not present

## 2019-05-17 ENCOUNTER — Non-Acute Institutional Stay: Payer: Medicare Other | Admitting: Hospice

## 2019-05-17 ENCOUNTER — Other Ambulatory Visit: Payer: Self-pay

## 2019-05-17 DIAGNOSIS — Z515 Encounter for palliative care: Secondary | ICD-10-CM

## 2019-05-17 DIAGNOSIS — F0281 Dementia in other diseases classified elsewhere with behavioral disturbance: Secondary | ICD-10-CM

## 2019-05-17 NOTE — Progress Notes (Addendum)
[facility] Consult Note Telephone: [phone]  Fax: [phone]  PATIENT NAME: [name] DOB: Mar 09, 1917 MRN: [id number]  PRIMARY CARE PROVIDER:   Leanna Battles, MD REFERRING PROVIDER:  Leanna Battles, MD [place],  [place] [address]  RESPONSIBLE Extended Emergency Contact Information Primary Emergency Contact: Aheron,Helen Address: [address] [place], [place] [address] Johnnette Litter of [place] Phone: [phone] Mobile Phone: [phone] Relation: Niece Secondary Emergency Contact: [place] Mobile Phone: [phone] Relation: Niece   RECOMMENDATIONS/PLAN:   Advance Care Planning/Goals of Care:  Visit consisted of building trust and follow p on palliative care. Patient is a DNR. Goals of care include to maximize quality of life and symptom management. DNR form uploaded in [facility] today. Symptom management: Ongoing memory loss related to Dementia. FAST 6d incontinent of bowel and bladder. Behavior controlled with Deparkote and Lexapro. Patient requires extensive assistance with care; feeds self after set up. Hoyer lift for transfers. Type 2 DM managed with Lantus and sliding scale Novolog, no concentrated sweets, last A1C in Nov '20 was 7.2. Patient sleeping more; Synthroid recently increased to 162.12mcg due to increased TSH, to recheck TSH level in 5 weeks. Education provided to nursing on need to give medication first early morning before food. Patient denied pain/discomfort. Nursing with no concerns. Encouraged on going supportive care.  Follow up: Palliative care will continue to follow patient for goals of care clarification and symptom management. I spent   minutes providing this consultation, from  More than 50% of the time in this consultation was spent on coordinating communication  I spent 30 minutes providing this consultation,  from 10.30am to 11am. More than 50% of the time in this  consultation was spent coordinating communication.   HISTORY OF PRESENT ILLNESS:  [name] is a 86 y.o. year old female with multiple medical problems including HTN, CAD, DM2, Hypothyroid, dementia. Palliative Care was asked to help address goals of care.    CODE STATUS: DNR  PPS: 30% HOSPICE ELIGIBILITY/DIAGNOSIS: TBD  PAST MEDICAL HISTORY:  Past Medical History:  Diagnosis Date  . Atrial fibrillation ([place])   . CAD (coronary artery disease)   . Colitis   . Colon cancer ([place])   . Diverticulosis of colon   . DJD (degenerative joint disease)   . DM (diabetes mellitus) ([place])   . Hip fracture ([place])   . HLD (hyperlipidemia)   . HTN (hypertension)   . Hypothyroidism   . Mobitz (type) II atrioventricular block   . Ventral hernia     SOCIAL HX:  Social History   Tobacco Use  . Smoking status: Never Smoker  . Smokeless tobacco: Never Used  Substance Use Topics  . Alcohol use: No    ALLERGIES:  Allergies  Allergen Reactions  . Diclofenac Sodium Other (See Comments)    Noted to be "allergic," per [facility]  . Duragesic-100 [Fentanyl] Other (See Comments)    Noted to be "allergic," per [facility]  . Hytrin [Terazosin Hcl] Other (See Comments)    Noted to be "allergic," per [facility]   . Ibuprofen Other (See Comments)    Noted to be "allergic," per [facility]  . Norvasc [Amlodipine Besylate] Other (See Comments)    Noted to be "allergic," per [facility]  . Plavix [Clopidogrel Bisulfate] Other (See Comments)    Noted to be "allergic," per [facility]      PERTINENT MEDICATIONS:  Outpatient Encounter Medications as of 05/17/2019  Medication Sig  . acetaminophen (TYLENOL) 325 MG  tablet Take 2 tablets (650 mg total) by mouth every 6 (six) hours as needed.  . cefdinir (OMNICEF) 300 MG capsule Take 1 capsule (300 mg total) by mouth every 12 (twelve) hours. 2 more days (Patient not taking: Reported on 04/29/2018)  . divalproex (DEPAKOTE SPRINKLE) 125 MG capsule Take 125 mg by mouth 2 (two) times daily.  Marland Kitchen  ELIQUIS 5 MG TABS tablet TAKE 1 TABLET BY MOUTH TWICE DAILY (Patient taking differently: Take 5 mg by mouth 2 (two) times daily. )  . escitalopram (LEXAPRO) 5 MG tablet Take 5 mg by mouth daily.  . ferrous sulfate 325 (65 FE) MG tablet Take 1 tablet (325 mg total) by mouth every 12 (twelve) hours. (Patient taking differently: Take 325 mg by mouth 2 (two) times daily. )  . furosemide (LASIX) 40 MG tablet Take 1 tablet (40 mg total) by mouth daily.  Marland Kitchen gabapentin (NEURONTIN) 300 MG capsule Take 300 mg by mouth at bedtime.  . insulin aspart (NOVOLOG) 100 UNIT/ML injection Inject 4 Units into the skin See admin instructions. Every day before lunch for 2 weeks  . levothyroxine (SYNTHROID, LEVOTHROID) 100 MCG tablet Take 100 mcg by mouth daily.   . magnesium hydroxide (MILK OF MAGNESIA) 400 MG/5ML suspension Take 30 mLs by mouth daily as needed for mild constipation.  . Multiple Vitamins-Minerals (MULTIVITAMIN PO) Take 1 tablet by mouth daily.  . polyethylene glycol (MIRALAX / GLYCOLAX) packet Take 17 g by mouth daily.  . potassium chloride SA (K-DUR,KLOR-CON) 20 MEQ tablet Take 1 tablet (20 mEq total) by mouth daily.  Marland Kitchen senna-docusate (SENEXON-S) 8.6-50 MG tablet Take 1 tablet by mouth daily.  . simvastatin (ZOCOR) 40 MG tablet Take 1 tablet (40 mg total) by mouth at bedtime. (Patient not taking: Reported on 04/29/2018)  . UNABLE TO FIND mentholatrm ; Apply to toes at bedtime (8pm)  for 12 weeks  . [DISCONTINUED] Probiotic Product (ALIGN PO) Take 1 capsule by mouth daily.     No facility-administered encounter medications on file as of 05/17/2019.    PHYSICAL EXAM/ROS:   General: in no acute distress Cardiovascular: regular rate and rhythm, denied pain/discomfort Pulmonary: clear ant fields, normal respiratory effort Abdomen: soft, nontender, + bowel sounds GU: no suprapubic tenderness Extremities: no edema Skin: no rashes to exposed skin Neurological: Weakness but otherwise nonfocal  Teodoro Spray, NP

## 2019-09-02 ENCOUNTER — Non-Acute Institutional Stay: Payer: Medicare Other | Admitting: Hospice

## 2019-09-02 ENCOUNTER — Other Ambulatory Visit: Payer: Self-pay

## 2019-09-02 DIAGNOSIS — Z515 Encounter for palliative care: Secondary | ICD-10-CM

## 2019-09-02 DIAGNOSIS — G309 Alzheimer's disease, unspecified: Secondary | ICD-10-CM

## 2019-09-02 DIAGNOSIS — F0281 Dementia in other diseases classified elsewhere with behavioral disturbance: Secondary | ICD-10-CM

## 2019-09-02 NOTE — Progress Notes (Signed)
[facility] Consult Note Telephone: [phone]  Fax: [phone]  PATIENT NAME: [name] DOB: January 14, 1917 MRN: [id number]  PRIMARY CARE PROVIDER:   Leanna Battles, MD  REFERRING PROVIDER:  Leanna Battles, MD [place],  [place] [address]  RESPONSIBLEExtended Emergency Contact Information Primary Emergency Contact: Bell,[name] Address: [address] [place], [place] [address] [name] of [place] Phone: [phone] Mobile Phone: [phone] Relation: Niece Secondary Emergency Contact: [place] Mobile Phone: [phone] Relation: Niece   RECOMMENDATIONS/PLAN:   Advance Care Planning/Goals of Care:  Visit consisted of building trust and follow up on palliative care. Patient is a DNR. Goals of care include to maximize quality of life and symptom management. DNR form uploaded in [facility] today. Symptom management: Ongoing memory loss related to Dementia no longer able to ambulate, incontinent of bowel and bladder FAST 7b, a decline from last visit where she was FAST6d. She is hoyer lift for all transfers, total care. Weight stable since last assessment, no reported falls or hospitalization.  She continues on Lexapro for Depression. Patient requires extensive assistance with care; feeds self after set up. Hoyer lift for transfers. Type 2 DM managed with Lantus and sliding scale Novolog, no concentrated sweets, last A1C in 07/11/2019 was 7.4. She continues on Synthroid 162.52mcg; recent TSH level within normal range.  Patient denied pain/discomfort. Nursing with no concerns. Encouraged on going supportive care.  Follow up: Palliative care will continue to follow patient for goals of care clarification and symptom management. I spent 50  minutes providing this consultation; time includes chart review and documentation. More than 50% of the time in this consultation was spent on coordinating  communication.  HISTORY OF PRESENT ILLNESS:[name] Mae Cooperis a 86 y.o.year oldfemalewith multiple medical problems including HTN, CAD, DM2, Hypothyroid, dementia, Depression. Palliative Care was asked to help address goals of care.   CODE STATUS: DNR  PPS: 30% HOSPICE ELIGIBILITY/DIAGNOSIS: TBD  PAST MEDICAL HISTORY:  Past Medical History:  Diagnosis Date  . Atrial fibrillation ([place])   . CAD (coronary artery disease)   . Colitis   . Colon cancer ([place])   . Diverticulosis of colon   . DJD (degenerative joint disease)   . DM (diabetes mellitus) ([place])   . Hip fracture ([place])   . HLD (hyperlipidemia)   . HTN (hypertension)   . Hypothyroidism   . Mobitz (type) II atrioventricular block   . Ventral hernia     SOCIAL HX:  Social History   Tobacco Use  . Smoking status: Never Smoker  . Smokeless tobacco: Never Used  Substance Use Topics  . Alcohol use: No    ALLERGIES:  Allergies  Allergen Reactions  . Diclofenac Sodium Other (See Comments)    Noted to be "allergic," per MAR  . Duragesic-100 [Fentanyl] Other (See Comments)    Noted to be "allergic," per MAR  . Hytrin [Terazosin Hcl] Other (See Comments)    Noted to be "allergic," per MAR   . Ibuprofen Other (See Comments)    Noted to be "allergic," per MAR  . Norvasc [Amlodipine Besylate] Other (See Comments)    Noted to be "allergic," per MAR  . Plavix [Clopidogrel Bisulfate] Other (See Comments)    Noted to be "allergic," per [facility]      PERTINENT MEDICATIONS:  Outpatient Encounter Medications as of 09/02/2019  Medication Sig  . acetaminophen (TYLENOL) 325 MG tablet Take 2 tablets (650 mg total) by mouth every 6 (six) hours as needed.  Marland Kitchen  cefdinir (OMNICEF) 300 MG capsule Take 1 capsule (300 mg total) by mouth every 12 (twelve) hours. 2 more days (Patient not taking: Reported on 04/29/2018)  . divalproex (DEPAKOTE SPRINKLE) 125 MG capsule Take 125 mg by mouth 2 (two) times daily.  Marland Kitchen ELIQUIS 5 MG TABS tablet TAKE  1 TABLET BY MOUTH TWICE DAILY (Patient taking differently: Take 5 mg by mouth 2 (two) times daily. )  . escitalopram (LEXAPRO) 5 MG tablet Take 5 mg by mouth daily.  . ferrous sulfate 325 (65 FE) MG tablet Take 1 tablet (325 mg total) by mouth every 12 (twelve) hours. (Patient taking differently: Take 325 mg by mouth 2 (two) times daily. )  . furosemide (LASIX) 40 MG tablet Take 1 tablet (40 mg total) by mouth daily.  Marland Kitchen gabapentin (NEURONTIN) 300 MG capsule Take 300 mg by mouth at bedtime.  . insulin aspart (NOVOLOG) 100 UNIT/ML injection Inject 4 Units into the skin See admin instructions. Every day before lunch for 2 weeks  . levothyroxine (SYNTHROID, LEVOTHROID) 100 MCG tablet Take 100 mcg by mouth daily.   . magnesium hydroxide (MILK OF MAGNESIA) 400 MG/5ML suspension Take 30 mLs by mouth daily as needed for mild constipation.  . Multiple Vitamins-Minerals (MULTIVITAMIN PO) Take 1 tablet by mouth daily.  . polyethylene glycol (MIRALAX / GLYCOLAX) packet Take 17 g by mouth daily.  . potassium chloride SA (K-DUR,KLOR-CON) 20 MEQ tablet Take 1 tablet (20 mEq total) by mouth daily.  Marland Kitchen senna-docusate (SENEXON-S) 8.6-50 MG tablet Take 1 tablet by mouth daily.  . simvastatin (ZOCOR) 40 MG tablet Take 1 tablet (40 mg total) by mouth at bedtime. (Patient not taking: Reported on 04/29/2018)  . UNABLE TO FIND mentholatrm ; Apply to toes at bedtime (8pm)  for 12 weeks  . [DISCONTINUED] Probiotic Product (ALIGN PO) Take 1 capsule by mouth daily.     No facility-administered encounter medications on file as of 09/02/2019.    PHYSICAL EXAM/ROS:   General: in no acute distress Cardiovascular: regular rate and rhythm, denied pain/discomfort Pulmonary: clear ant fields, normal respiratory effort Abdomen: soft, nontender, + bowel sounds GU: no suprapubic tenderness Extremities: no edema Skin: no rashes to exposed skin Neurological: Weakness but otherwise nonfocal  Teodoro Spray, NP

## 2019-10-21 ENCOUNTER — Other Ambulatory Visit: Payer: Self-pay

## 2019-10-21 ENCOUNTER — Non-Acute Institutional Stay: Payer: Medicare Other | Admitting: Hospice

## 2019-10-21 DIAGNOSIS — G309 Alzheimer's disease, unspecified: Secondary | ICD-10-CM

## 2019-10-21 DIAGNOSIS — Z515 Encounter for palliative care: Secondary | ICD-10-CM

## 2019-10-21 NOTE — Progress Notes (Signed)
[facility] Consult Note Telephone: [phone]  PATIENT NAME: [name] DOB: 06-23-1916 MRN: [id number]  PRIMARY CARE PROVIDER:   Leanna Battles, MD  REFERRING PROVIDER:  Leanna Battles, MD [place],  [place] [address]  RESPONSIBLEExtended Emergency Contact Information Primary Emergency Contact: [name] Address: [address] [place], [place] [address] [name] of [place] Phone: [phone] Mobile Phone: [phone] Relation: Niece Secondary Emergency Contact: [name] Mobile Phone: [phone] Relation: Niece   RECOMMENDATIONS/PLAN:  Advance Care Planning/Goals of Care: Visit consisted of building trust and follow up on palliative care. Patient is a DNR. NP spoke with [name] updating her on visit. Discussion on dealing with the complex and emotionally intense issues of symptom management and palliative care in the setting of serious and potentially life-threatening illness. Goals of care include to maximize quality of life and symptom management. Palliative care team will continue to support patient, patient's family, and medical team.    Symptom management:Ongoing memory loss related to Dementia no longer able to ambulate, incontinent of bowel and bladder FAST 7b. She is hoyer lift for all transfers, total care; no reported falls or hospitalization.  She continues on Lexapro for Depression. Type 2 DM managed with Lantus and sliding scale Novolog, no concentrated sweets, last A1C in 07/11/2019 was 7.4.She continues on Synthroid, recent TSH level within normal range.  Patient denied pain/discomfort. Nursing with no concerns. Encouraged on going supportive care. Follow IO:[id number] care will continue to follow patient for goals of care clarification and symptom management. I spent 46 minutes providing this consultation; time includes chart review and documentation.More than 50% of  the time in this consultation was spent on coordinating communication.  HISTORY OF PRESENT ILLNESS:[name] Mae Cooperis a 86 y.o.year oldfemalewith multiple medical problems including HTN, CAD, DM2, Hypothyroid, dementia, Depression. Palliative Care was asked to help address goals of care.   CODE STATUS: DNR  PPS: 30% HOSPICE ELIGIBILITY/DIAGNOSIS: TBD  PAST MEDICAL HISTORY:  Past Medical History:  Diagnosis Date  . Atrial fibrillation ([place])   . CAD (coronary artery disease)   . Colitis   . Colon cancer ([place])   . Diverticulosis of colon   . DJD (degenerative joint disease)   . DM (diabetes mellitus) ([place])   . Hip fracture ([place])   . HLD (hyperlipidemia)   . HTN (hypertension)   . Hypothyroidism   . Mobitz (type) II atrioventricular block   . Ventral hernia     SOCIAL HX:  Social History   Tobacco Use  . Smoking status: Never Smoker  . Smokeless tobacco: Never Used  Substance Use Topics  . Alcohol use: No    ALLERGIES:  Allergies  Allergen Reactions  . Diclofenac Sodium Other (See Comments)    Noted to be "allergic," per MAR  . Duragesic-100 [Fentanyl] Other (See Comments)    Noted to be "allergic," per MAR  . Hytrin [Terazosin Hcl] Other (See Comments)    Noted to be "allergic," per MAR   . Ibuprofen Other (See Comments)    Noted to be "allergic," per MAR  . Norvasc [Amlodipine Besylate] Other (See Comments)    Noted to be "allergic," per MAR  . Plavix [Clopidogrel Bisulfate] Other (See Comments)    Noted to be "allergic," per [facility]      PERTINENT MEDICATIONS:  Outpatient Encounter Medications as of 10/21/2019  Medication Sig  . acetaminophen (TYLENOL) 325 MG tablet Take 2 tablets (650 mg total) by mouth every 6 (six) hours as needed.  Marland Kitchen  cefdinir (OMNICEF) 300 MG capsule Take 1 capsule (300 mg total) by mouth every 12 (twelve) hours. 2 more days (Patient not taking: Reported on 04/29/2018)  . divalproex (DEPAKOTE SPRINKLE) 125 MG capsule Take 125 mg by  mouth 2 (two) times daily.  Marland Kitchen ELIQUIS 5 MG TABS tablet TAKE 1 TABLET BY MOUTH TWICE DAILY (Patient taking differently: Take 5 mg by mouth 2 (two) times daily. )  . escitalopram (LEXAPRO) 5 MG tablet Take 5 mg by mouth daily.  . ferrous sulfate 325 (65 FE) MG tablet Take 1 tablet (325 mg total) by mouth every 12 (twelve) hours. (Patient taking differently: Take 325 mg by mouth 2 (two) times daily. )  . furosemide (LASIX) 40 MG tablet Take 1 tablet (40 mg total) by mouth daily.  Marland Kitchen gabapentin (NEURONTIN) 300 MG capsule Take 300 mg by mouth at bedtime.  . insulin aspart (NOVOLOG) 100 UNIT/ML injection Inject 4 Units into the skin See admin instructions. Every day before lunch for 2 weeks  . levothyroxine (SYNTHROID, LEVOTHROID) 100 MCG tablet Take 100 mcg by mouth daily.   . magnesium hydroxide (MILK OF MAGNESIA) 400 MG/5ML suspension Take 30 mLs by mouth daily as needed for mild constipation.  . Multiple Vitamins-Minerals (MULTIVITAMIN PO) Take 1 tablet by mouth daily.  . polyethylene glycol (MIRALAX / GLYCOLAX) packet Take 17 g by mouth daily.  . potassium chloride SA (K-DUR,KLOR-CON) 20 MEQ tablet Take 1 tablet (20 mEq total) by mouth daily.  Marland Kitchen senna-docusate (SENEXON-S) 8.6-50 MG tablet Take 1 tablet by mouth daily.  . simvastatin (ZOCOR) 40 MG tablet Take 1 tablet (40 mg total) by mouth at bedtime. (Patient not taking: Reported on 04/29/2018)  . UNABLE TO FIND mentholatrm ; Apply to toes at bedtime (8pm)  for 12 weeks  . [DISCONTINUED] Probiotic Product (ALIGN PO) Take 1 capsule by mouth daily.     No facility-administered encounter medications on file as of 10/21/2019.    PHYSICAL EXAM/ROS: General:in no acute distress, content Cardiovascular: regular rate and rhythm, denied pain/discomfort Pulmonary: clear ant fields, normal respiratory effort Abdomen: soft, nontender, + bowel sounds GU: no suprapubic tenderness Extremities: no edema Skin: no rashesto exposed skin Neurological:  Weakness but otherwise nonfocal  Teodoro Spray, NP

## 2019-10-30 IMAGING — DX DG PELVIS 1-2V
3 series · 3 of 3 positions shown · non-contrast
Comparison: None.

CLINICAL DATA: Pain due to a fall today.  Initial encounter.

EXAM:
PELVIS - 1-2 VIEW

[pelvis ap (1 of 3)]
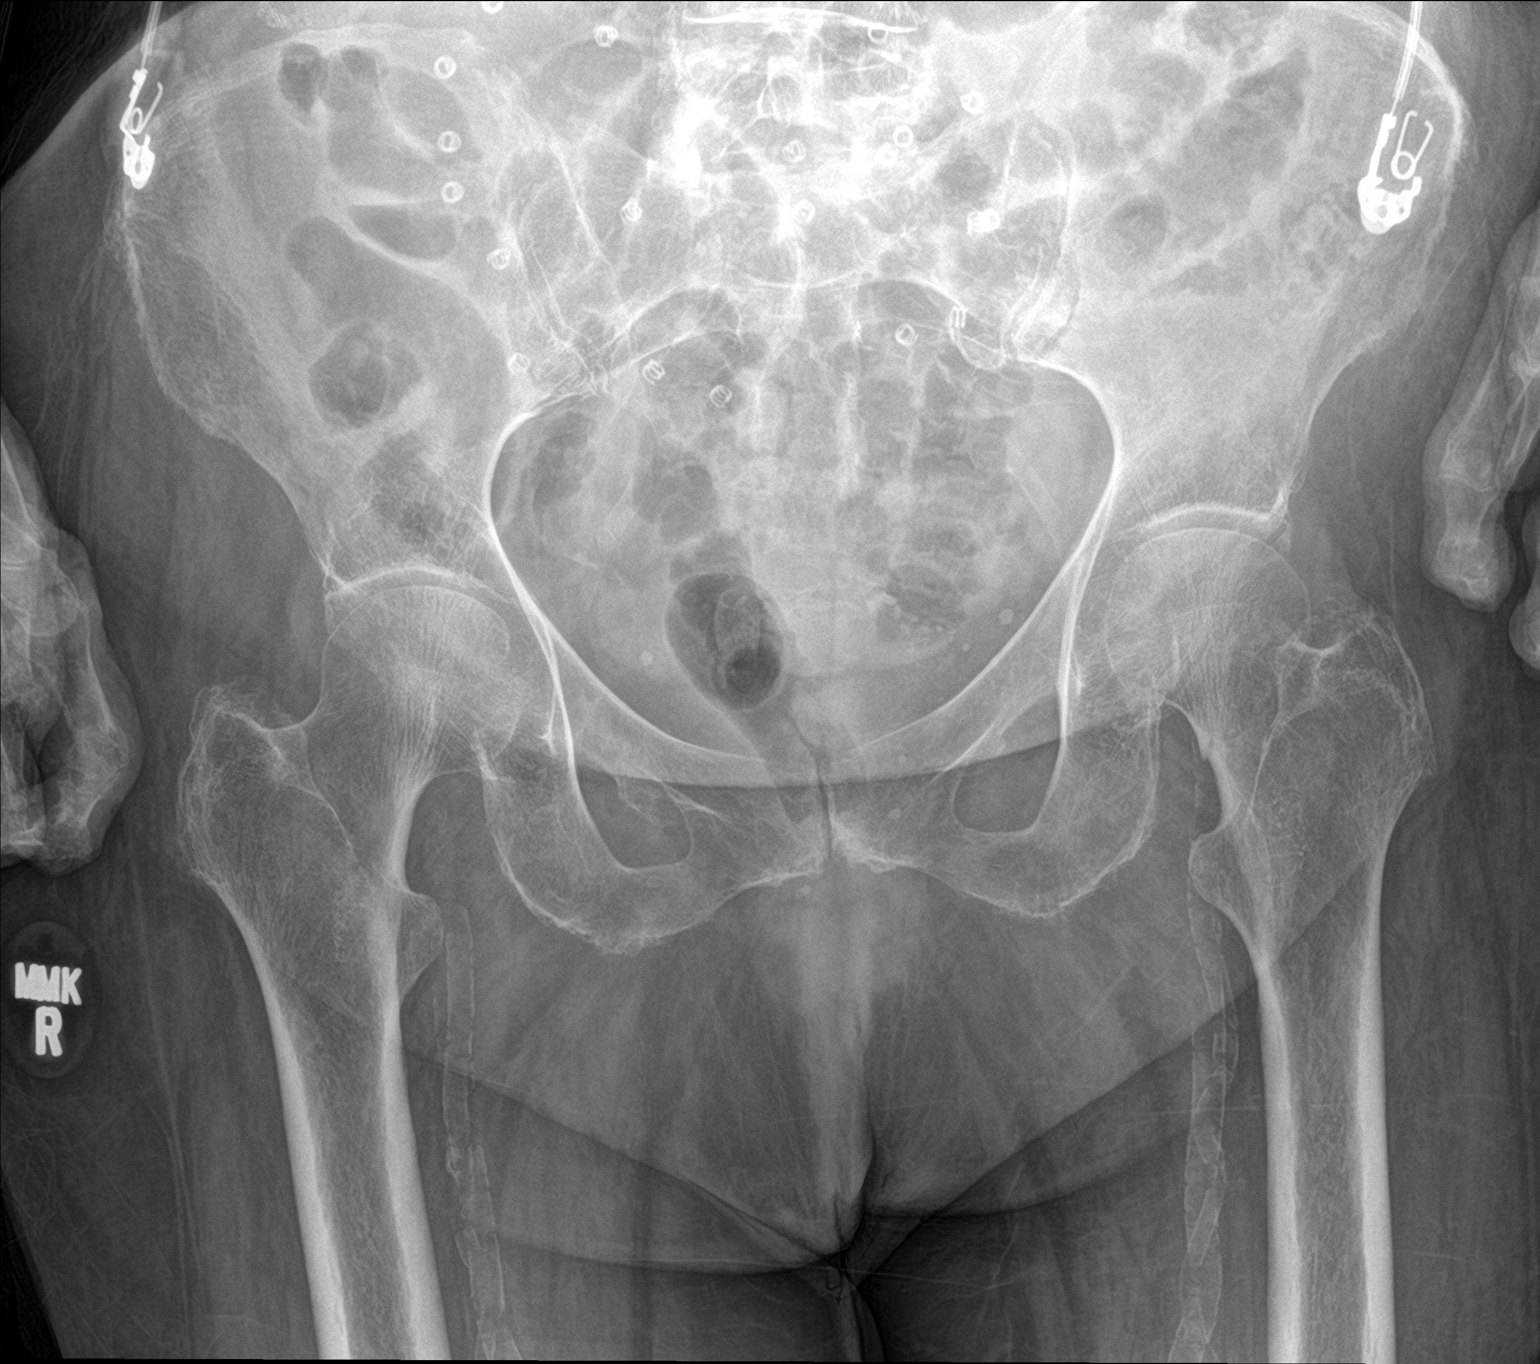

[pelvis ap (2 of 3)]
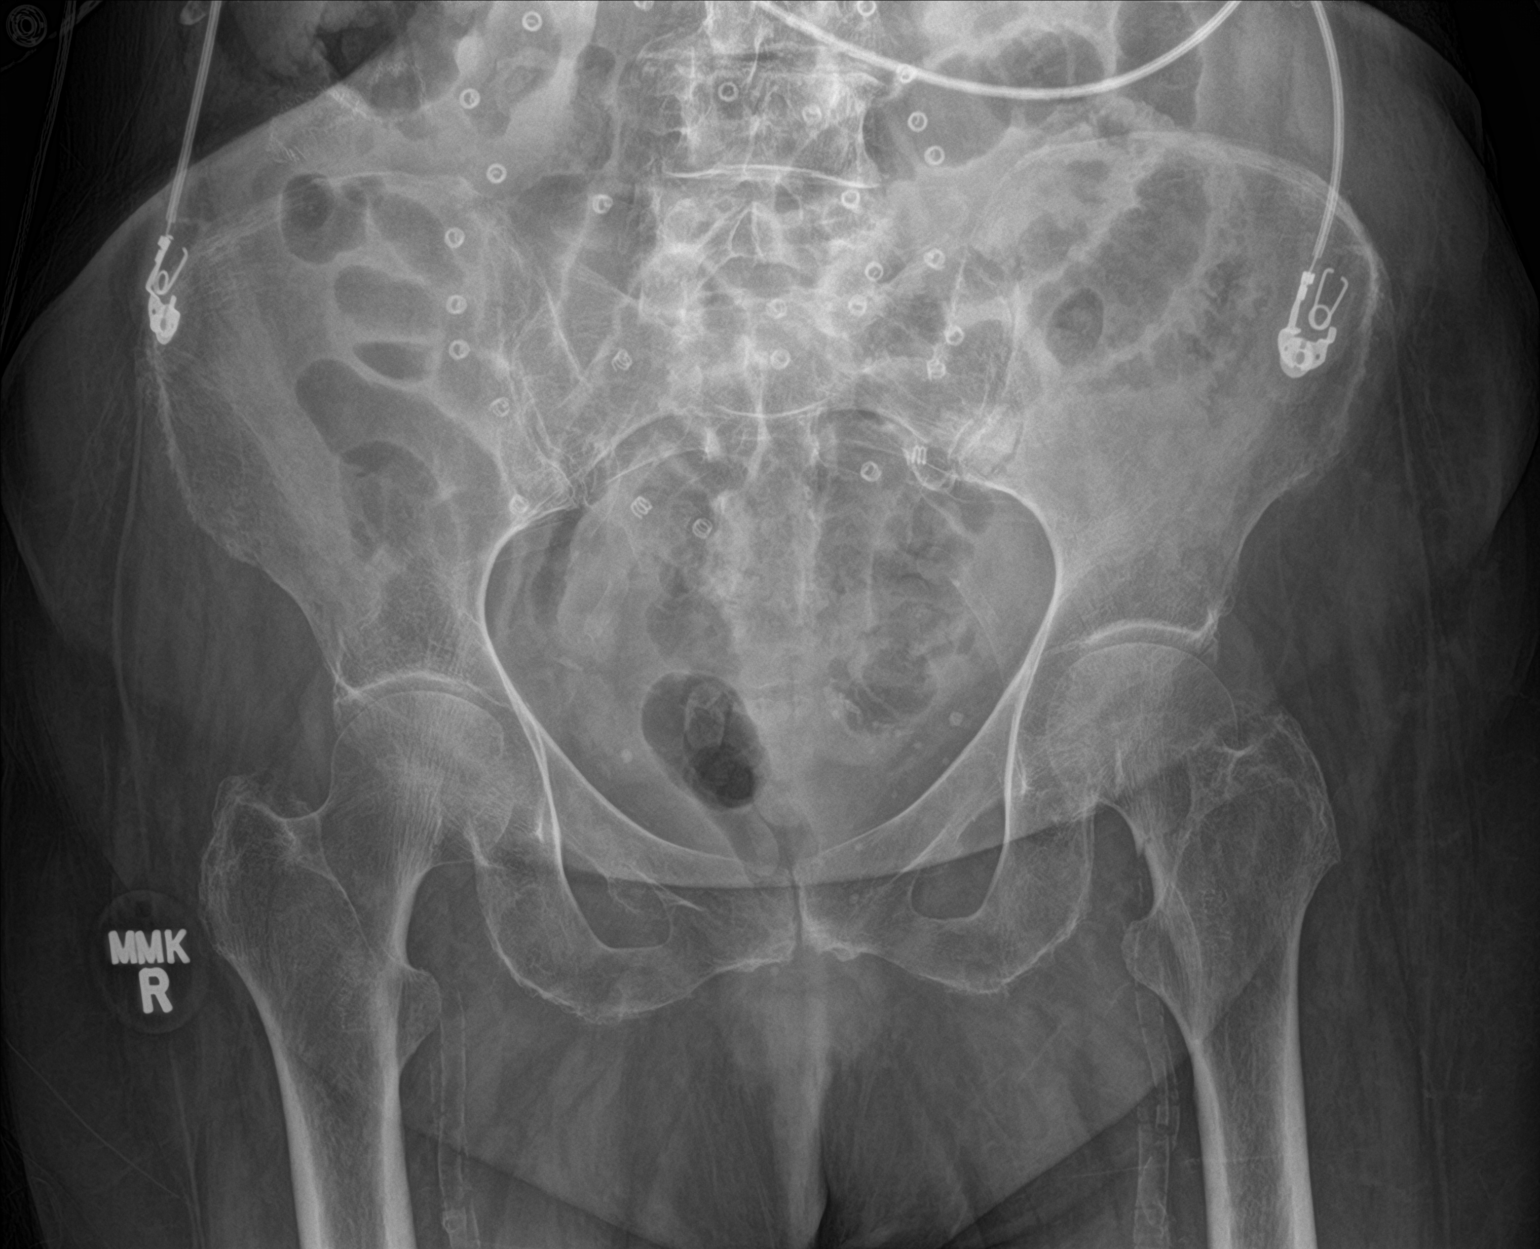

[pelvis ap (3 of 3)]
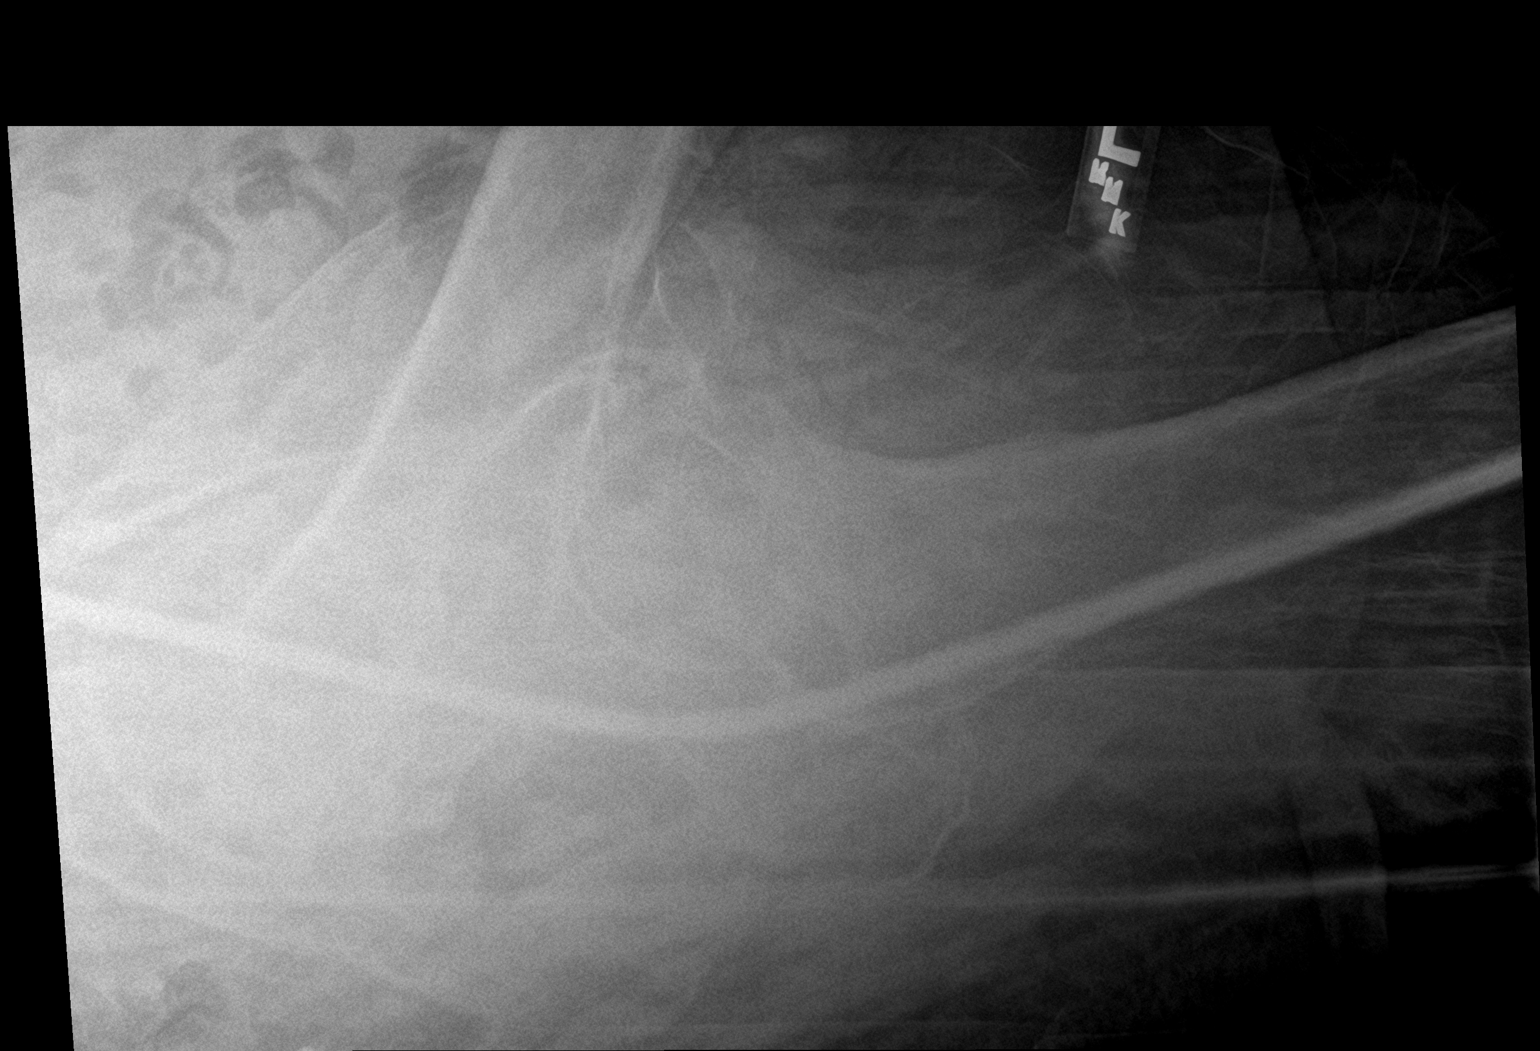

[3 of 3 positions shown; findings below may reference images not displayed]

FINDINGS: The patient has a nondisplaced fracture through approximately the
mid aspect of the left femoral neck. No other acute bony or joint
abnormality is identified. Mesh from hernia repair and
atherosclerotic vascular disease are noted.
IMPRESSION: Acute nondisplaced fracture mid aspect of the left femoral neck.

## 2019-10-30 IMAGING — DX DG KNEE COMPLETE 4+V*L*
4 series · 4 of 4 positions shown · non-contrast
Comparison: 06/13/2016

CLINICAL DATA: Left knee pain.  Closed left hip fracture

EXAM:
LEFT KNEE - COMPLETE 4+ VIEW

[knee ap]
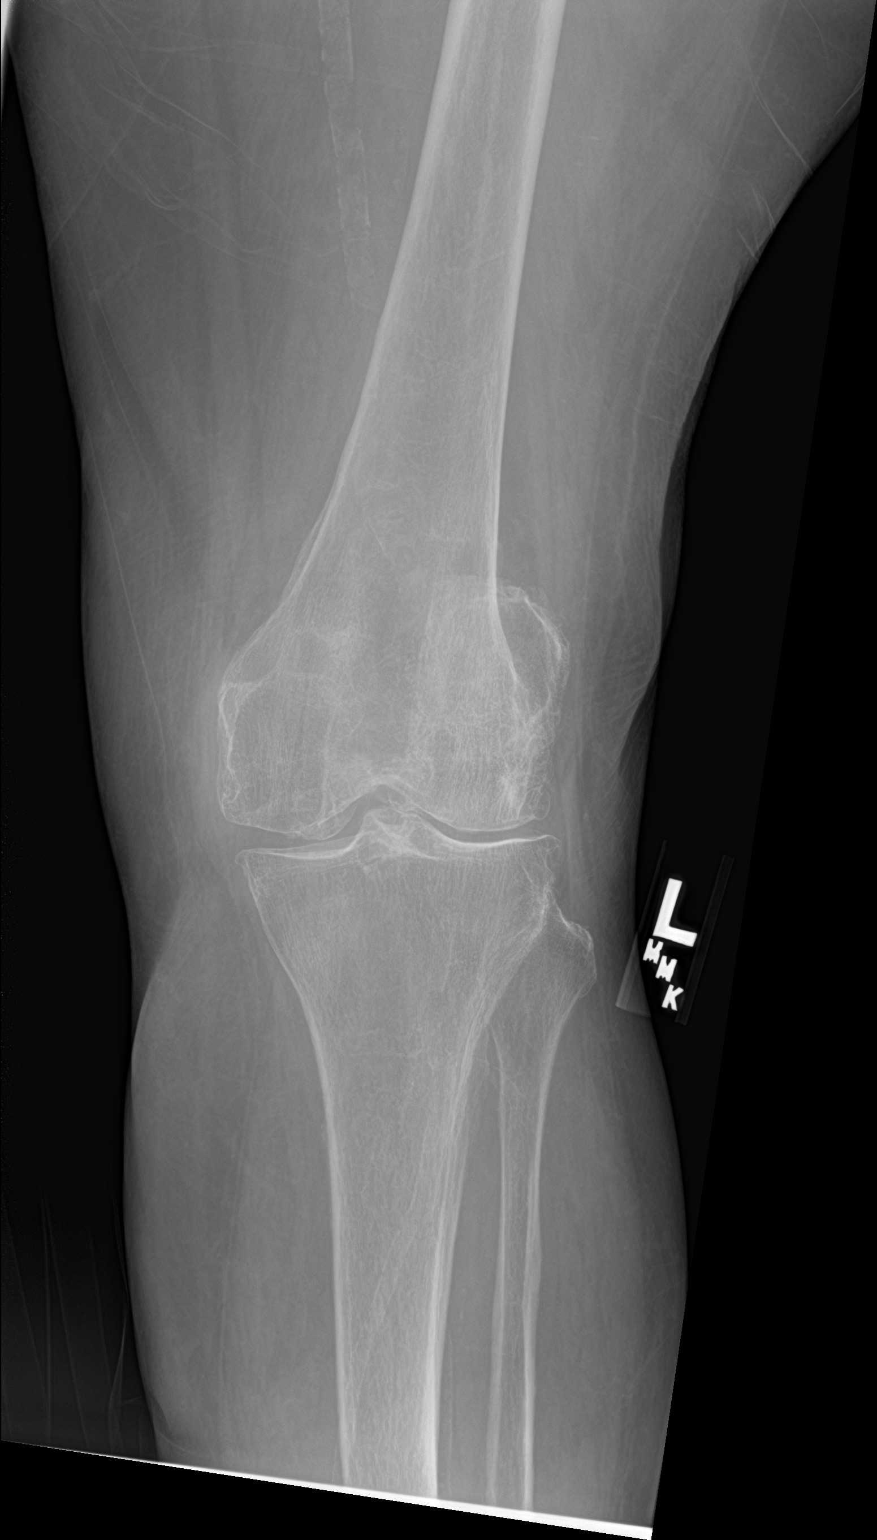

[knee lat]
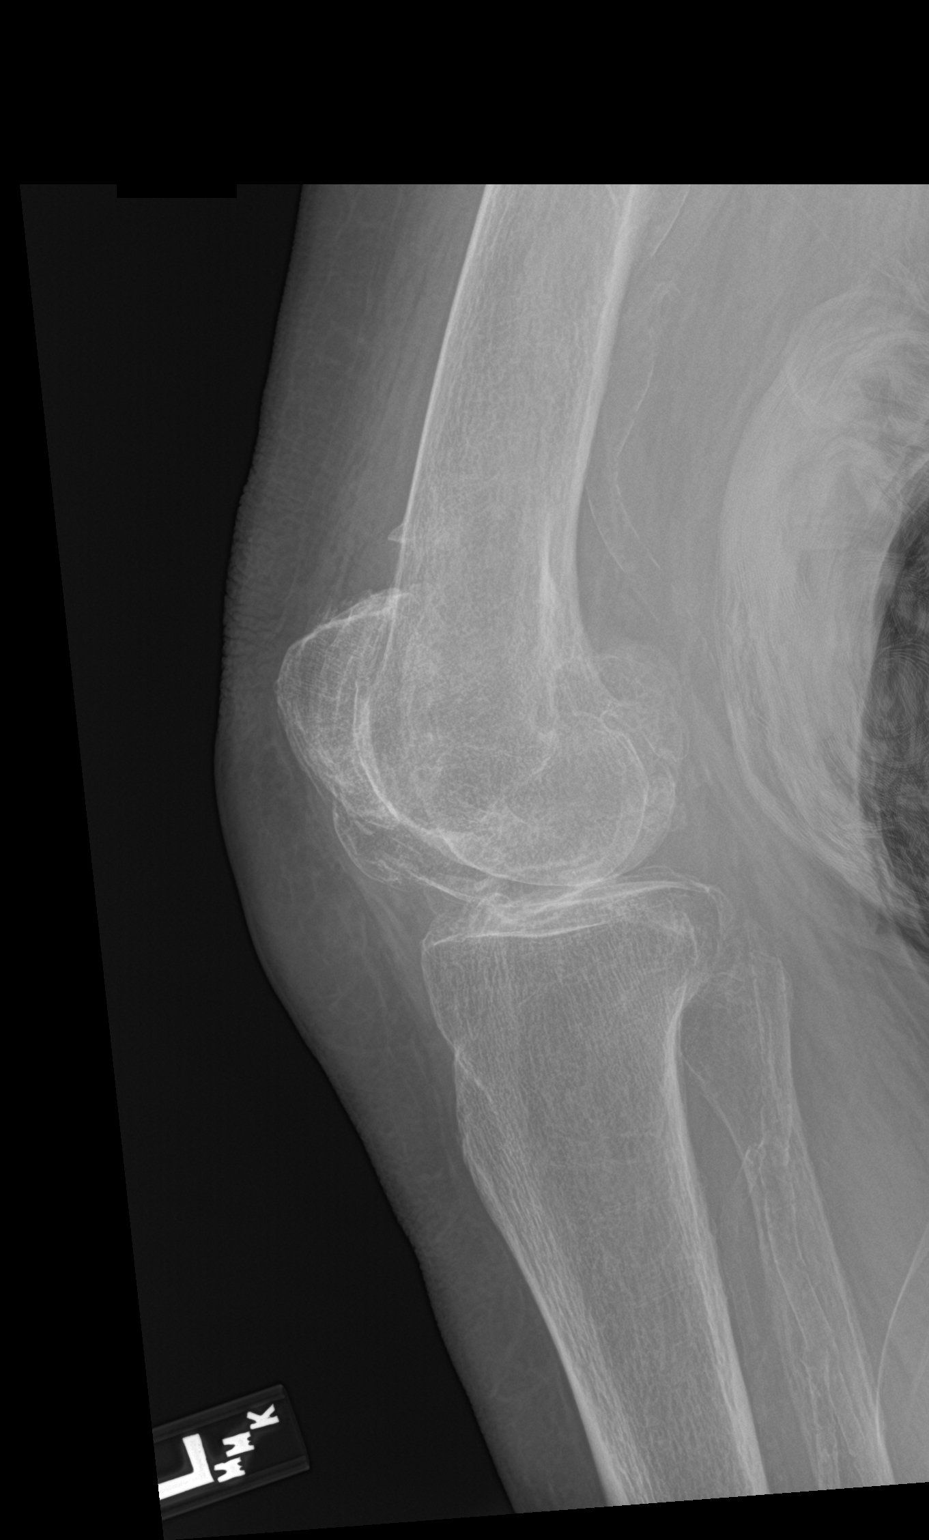

[knee obl (1 of 2)]
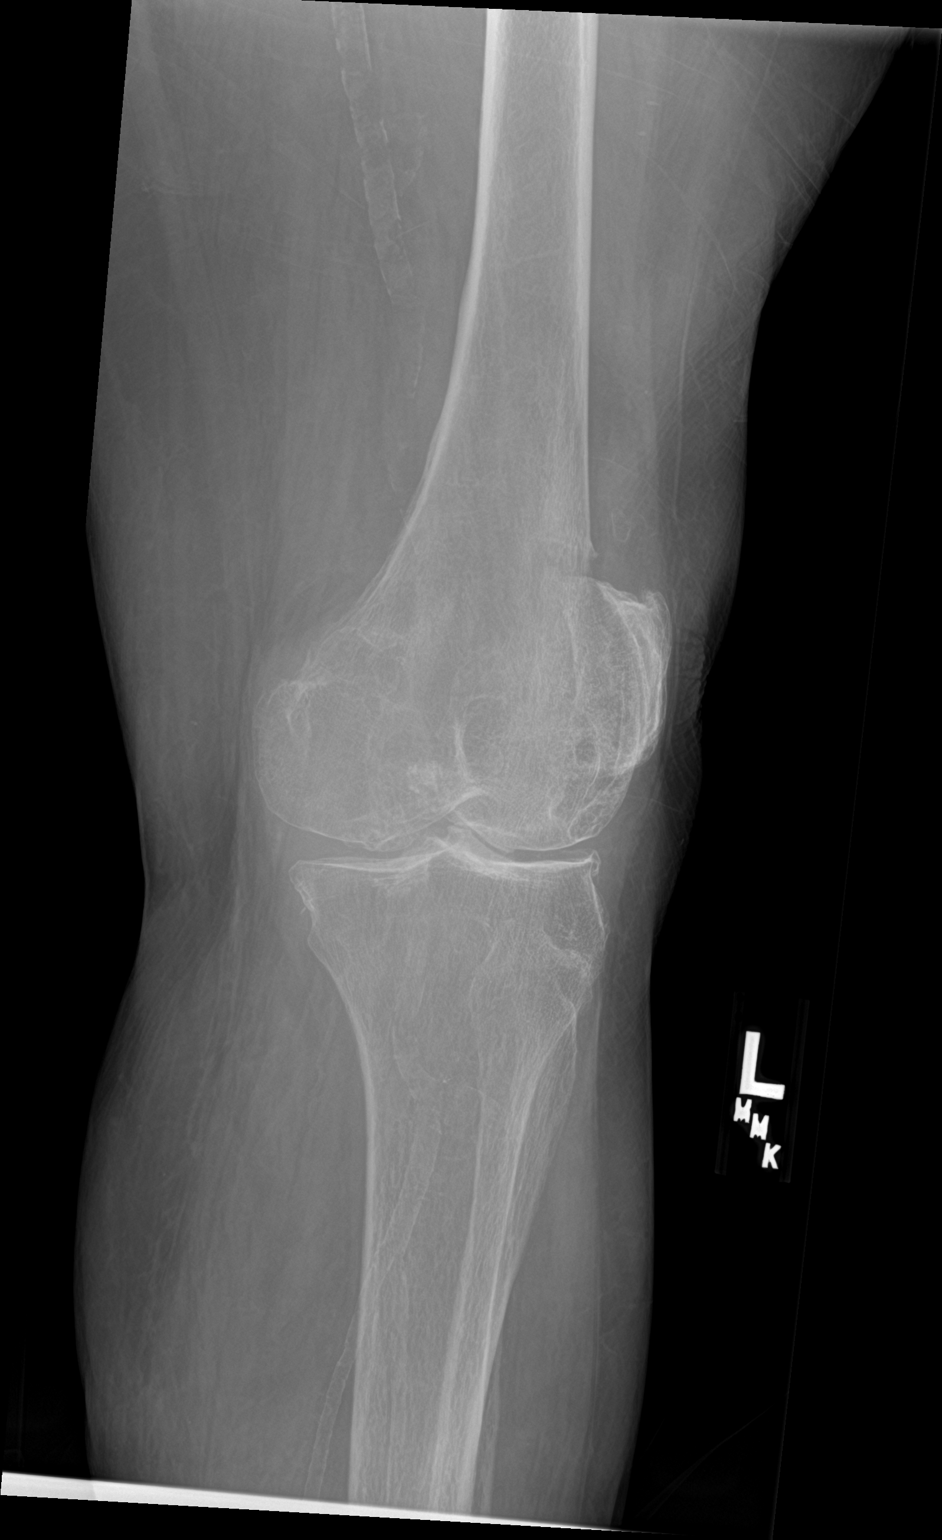

[knee obl (2 of 2)]
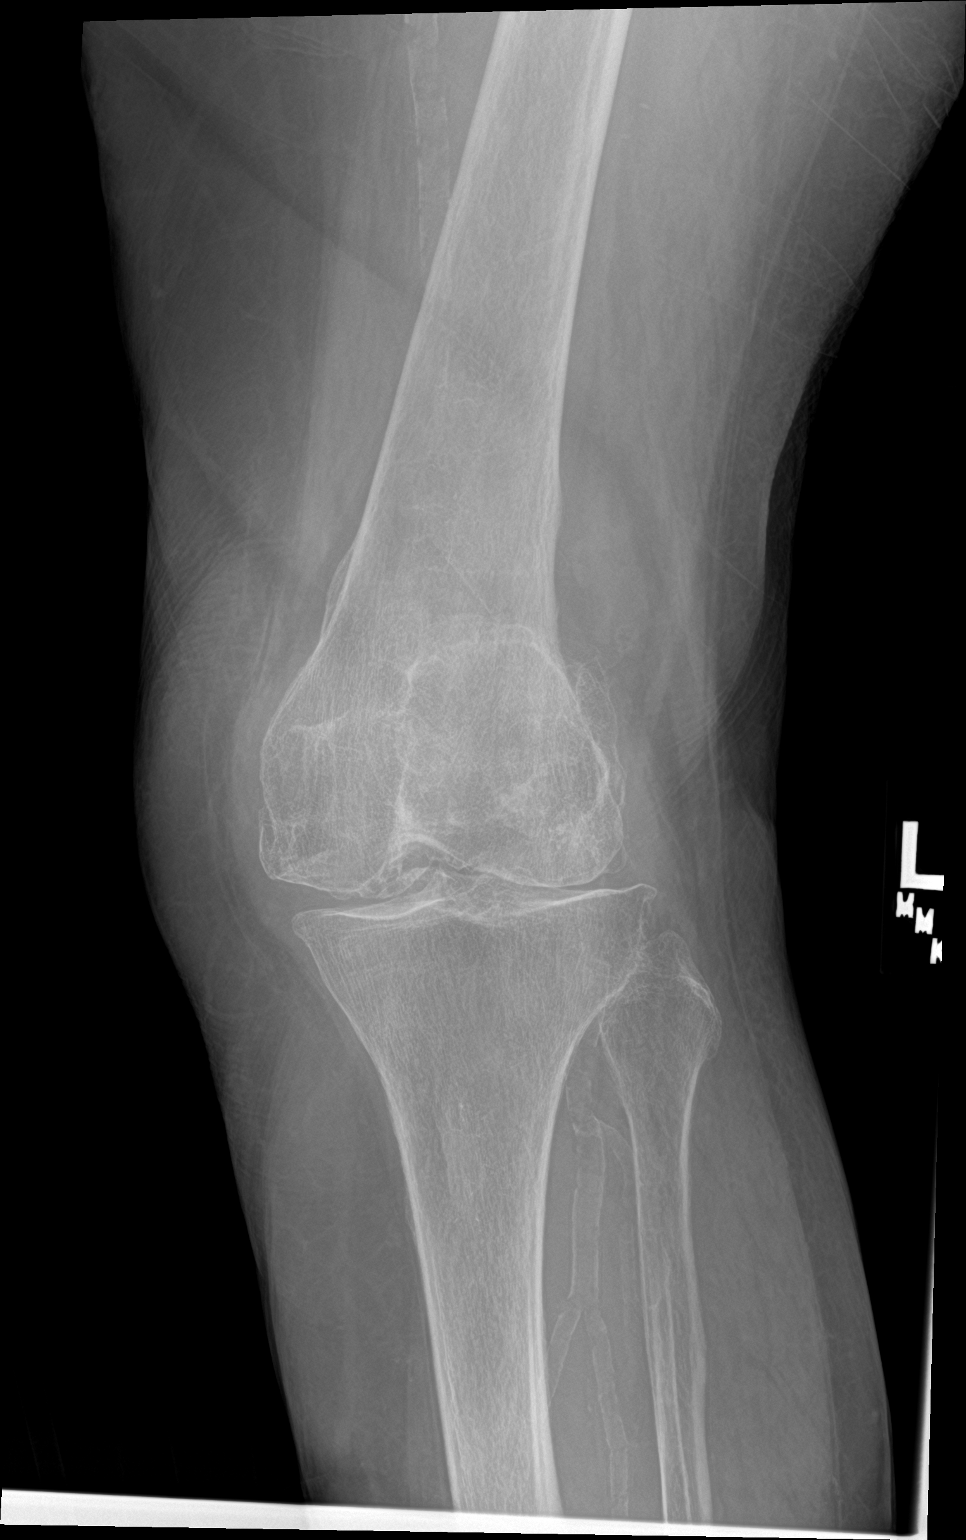

[4 of 4 positions shown; findings below may reference images not displayed]

FINDINGS: No evidence of fracture, dislocation, or joint effusion.
Tricompartmental osteoarthritis with marginal spurring and narrowing
that is generalized. Patellofemoral degeneration is the greatest.
Osteopenia and atherosclerosis.
IMPRESSION: 1. No acute finding.
2. Osteoarthritis and atherosclerosis.

## 2020-02-20 ENCOUNTER — Telehealth: Payer: Self-pay | Admitting: Internal Medicine

## 2020-02-20 NOTE — Telephone Encounter (Signed)
New message   STAT if HR is under 50 or over 120 (normal HR is 60-100 beats per minute)  1) What is your heart rate? Log every day today in the 30s--normal 60-80  2) Do you have a log of your heart rate readings (document readings)? Just happened today   3) Do you have any other symptoms? Not per Olen Cordial from [facility] called to report pt having a heart rate of 30 today. He called to see if pt could get appt but pt family does not want pt seen d/t her age. Pt does not have transmission box at the facility.

## 2020-02-22 NOTE — Telephone Encounter (Signed)
Called Marcie Bal and spoke with her. HR yesterday was in the 40's BP 220/80 manual readings. Today she has a heart rate of 93. Nurse Marcie Bal will recheck when she does her med pass. Will forward to device clinic to follow up because it appears she has a Pacemaker.  Marcie Bal should have current reading by 9 am.

## 2020-02-22 NOTE — Telephone Encounter (Signed)
We have not received a remote transmission from this patient since May 2019.  Per this message patient no longer has remote monitor.  In 2019, her battery had less than 2 years remaining on the battery and she was V pacing 99% of the time, device programmed VVIR 60 so she has likely reached EOS.  Certified letter was sent June 2020

## 2020-02-22 NOTE — Telephone Encounter (Signed)
Called Nurse Marcie Bal, patients HR is 44. States she doesn't think the 93 reading from earlier was true.  Advised that it sounded like the patient reached EOS with her pacemaker and I would reach out to the family for a plan.   Left message to call back [name])   Voice mail box not set up yet Letta Median)

## 2020-02-22 NOTE — Telephone Encounter (Signed)
Spoke to [place] at [facility] and let her know the family's wishes. She verbalized understanding.

## 2020-02-22 NOTE — Telephone Encounter (Signed)
Left message to call back  

## 2020-02-22 NOTE — Telephone Encounter (Signed)
[name] returned my phone call. We spoke about the patients battery in the pacemaker no longer working and we need to discuss what the plan should be.  At this time [name] let me know that they were informed about the low HR's and that she and other family members were discussing the pacemaker and if the battery was dead what the plan going forward should be. [name] advised that she was the [facility] for the patient. Also that [place] doesn't get up anymore and is bedridden, that she doesn't have her mind but that she is always smiling and is happy. She then let me know that the family feels like it is time for [facility] to rest and they don't won't her going for a procedure.   I advised her that I would let the [facility] know the plan.

## 2020-02-22 NOTE — Telephone Encounter (Signed)
    [name] is returning call

## 2020-03-07 ENCOUNTER — Other Ambulatory Visit: Payer: Self-pay

## 2020-03-07 ENCOUNTER — Non-Acute Institutional Stay: Payer: Medicare Other | Admitting: Hospice

## 2020-03-07 DIAGNOSIS — G309 Alzheimer's disease, unspecified: Secondary | ICD-10-CM

## 2020-03-07 DIAGNOSIS — Z515 Encounter for palliative care: Secondary | ICD-10-CM

## 2020-03-07 DIAGNOSIS — F0281 Dementia in other diseases classified elsewhere with behavioral disturbance: Secondary | ICD-10-CM

## 2020-03-07 NOTE — Progress Notes (Signed)
[facility] Consult Note Telephone: [phone]  Fax: [phone]  PATIENT NAME: [name] DOB: 12/28/1916 MRN: [id number]  PRIMARY CARE PROVIDER:   Leanna Battles, MD Leanna Battles, [place],  [place] [address]  REFERRING PROVIDER: Leanna Battles, MD Leanna Battles, [place],  [place] [address]  RESPONSIBLEExtended Emergency Contact Information Primary Emergency Contact: Aheron,Helen Address: [address] [place], [place] [address] Johnnette Litter of [place] Phone: [phone] Mobile Phone: [phone] Relation: Niece Secondary Emergency Contact: [place] Mobile Phone: [phone] Relation: Niece   RECOMMENDATIONS/PLAN:  Advance Care Planning/Goals of Care: Visit consisted of building trust and followup on palliative care.  CODE STATUS: Code status reviewed. Patient is a DO NOT RESUSCITATE. GOALS OF CARE: Goals of care include to maximize quality of life and symptom management. Family pursuing comfort care at this time. Called and spoke with helen who affirmed comfort care only. She said family is open to hospice care in the future when appropriate so long as patient is not moved away from the facility. NP assured her hospice service can be provided right in the facility where patient is. NP also discussed hospice prospect with SW Benjamine Mola and MDS coordinator. Palliative care team will continue to support patient, patient's family, and medical team.    Symptom management: Bradycardia: Patient with ongoing bradycardia related to malfunctioning pacemaker. Family decided not to change pacemaker/battery. Comfort care.  Ongoing memory loss related to [place] longer able to ambulate,sleeping more, up to 18 - 20 hours a day, incontinent of bowel and bladder FAST 7b. She is hoyer lift for all transfers, total care; no reported falls or hospitalization.  She is  followed by Psych for depression/anxiety. Psych NP reports she is weaning patient off Lexapro.  FLACC 0. Nursing with no concerns. Encouraged on going supportive care. Follow [id number] care will continue to follow patient for goals of care clarification and symptom management. I spent 46 minutes providing this consultation; time includes chart review and documentation.More than 50% of the time in this consultation was spent on coordinating communication.  HISTORY OF PRESENT ILLNESS:Barri Mae Cooperis a 86 y.o.year oldfemalewith multiple medical problems including HTN, CAD, DM2, Hypothyroid, dementia, Depression.Palliative Care was asked to help address goals of care.   CODE STATUS:DNR  PPS:30%  HOSPICE ELIGIBILITY/DIAGNOSIS: TBD  PAST MEDICAL HISTORY:  Past Medical History:  Diagnosis Date  . Atrial fibrillation ([place])   . CAD (coronary artery disease)   . Colitis   . Colon cancer ([place])   . Diverticulosis of colon   . DJD (degenerative joint disease)   . DM (diabetes mellitus) ([place])   . Hip fracture ([place])   . HLD (hyperlipidemia)   . HTN (hypertension)   . Hypothyroidism   . Mobitz (type) II atrioventricular block   . Ventral hernia     SOCIAL HX:  Social History   Tobacco Use  . Smoking status: Never Smoker  . Smokeless tobacco: Never Used  Substance Use Topics  . Alcohol use: No    ALLERGIES:  Allergies  Allergen Reactions  . Diclofenac Sodium Other (See Comments)    Noted to be "allergic," per MAR  . Duragesic-100 [Fentanyl] Other (See Comments)    Noted to be "allergic," per MAR  . Hytrin [Terazosin Hcl] Other (See Comments)    Noted to be "allergic," per MAR   . Ibuprofen Other (See Comments)    Noted to be "allergic," per MAR  . Norvasc [Amlodipine Besylate] Other (See  Comments)    Noted to be "allergic," per MAR  . Plavix [Clopidogrel Bisulfate] Other (See Comments)    Noted to be "allergic," per [facility]      PERTINENT MEDICATIONS:    Outpatient Encounter Medications as of 03/07/2020  Medication Sig  . acetaminophen (TYLENOL) 325 MG tablet Take 2 tablets (650 mg total) by mouth every 6 (six) hours as needed.  . cefdinir (OMNICEF) 300 MG capsule Take 1 capsule (300 mg total) by mouth every 12 (twelve) hours. 2 more days (Patient not taking: Reported on 04/29/2018)  . divalproex (DEPAKOTE SPRINKLE) 125 MG capsule Take 125 mg by mouth 2 (two) times daily.  Marland Kitchen ELIQUIS 5 MG TABS tablet TAKE 1 TABLET BY MOUTH TWICE DAILY (Patient taking differently: Take 5 mg by mouth 2 (two) times daily. )  . escitalopram (LEXAPRO) 5 MG tablet Take 5 mg by mouth daily.  . ferrous sulfate 325 (65 FE) MG tablet Take 1 tablet (325 mg total) by mouth every 12 (twelve) hours. (Patient taking differently: Take 325 mg by mouth 2 (two) times daily. )  . furosemide (LASIX) 40 MG tablet Take 1 tablet (40 mg total) by mouth daily.  Marland Kitchen gabapentin (NEURONTIN) 300 MG capsule Take 300 mg by mouth at bedtime.  . insulin aspart (NOVOLOG) 100 UNIT/ML injection Inject 4 Units into the skin See admin instructions. Every day before lunch for 2 weeks  . levothyroxine (SYNTHROID, LEVOTHROID) 100 MCG tablet Take 100 mcg by mouth daily.   . magnesium hydroxide (MILK OF MAGNESIA) 400 MG/5ML suspension Take 30 mLs by mouth daily as needed for mild constipation.  . Multiple Vitamins-Minerals (MULTIVITAMIN PO) Take 1 tablet by mouth daily.  . polyethylene glycol (MIRALAX / GLYCOLAX) packet Take 17 g by mouth daily.  . potassium chloride SA (K-DUR,KLOR-CON) 20 MEQ tablet Take 1 tablet (20 mEq total) by mouth daily.  Marland Kitchen senna-docusate (SENEXON-S) 8.6-50 MG tablet Take 1 tablet by mouth daily.  . simvastatin (ZOCOR) 40 MG tablet Take 1 tablet (40 mg total) by mouth at bedtime. (Patient not taking: Reported on 04/29/2018)  . UNABLE TO FIND mentholatrm ; Apply to toes at bedtime (8pm)  for 12 weeks  . [DISCONTINUED] Probiotic Product (ALIGN PO) Take 1 capsule by mouth daily.     No  facility-administered encounter medications on file as of 03/07/2020.    PHYSICAL EXAM/ROS Weight 168 Lbs Height 5 feet 3 inches General: NAD, frail appearing Cardiovascular: bradycardic Pulmonary: normal respiratory effort on room air Abdomen: soft, nontender, + bowel sounds GU: no suprapubic tenderness Extremities: no edema, no joint deformities Skin: no rashes to exposed skin Neurological: Weakness, somnolent  Teodoro Spray, NP

## 2020-05-18 ENCOUNTER — Other Ambulatory Visit: Payer: Self-pay

## 2020-05-18 ENCOUNTER — Non-Acute Institutional Stay: Payer: Medicare Other | Admitting: Hospice

## 2020-05-18 DIAGNOSIS — Z515 Encounter for palliative care: Secondary | ICD-10-CM

## 2020-05-18 DIAGNOSIS — F0281 Dementia in other diseases classified elsewhere with behavioral disturbance: Secondary | ICD-10-CM

## 2020-05-18 DIAGNOSIS — G309 Alzheimer's disease, unspecified: Secondary | ICD-10-CM

## 2020-05-18 NOTE — Progress Notes (Signed)
[facility] Consult Note Telephone: [phone]  Fax: [phone]  PATIENT NAME: [name] DOB: [birth year] MRN: [id number]  PRIMARY CARE PROVIDER:   Leanna Battles, MD Leanna Battles, [place],  [place] [address]  REFERRING PROVIDER: Leanna Battles, MD Leanna Battles, [place],  [place] [address]  RESPONSIBLEExtended Emergency Contact Information Primary Emergency Contact: Aheron,Helen Address: [address] [place],  [address] [place] Phone: [phone] Mobile Phone: [phone] Relation: Niece Secondary Emergency Contact: [place] Mobile Phone: [phone] Relation: Niece   RECOMMENDATIONS/PLAN:  Advance Care Planning/Goals of Care: Visit consisted of building trust and discussions on Palliative Medicine as specialized medical care for people living with serious illness, aimed at facilitating better quality of life through symptoms relief, assisting with advance care plan and establishing goals of care.  CODE STATUS: Code status reviewed. Patient is a DO NOT RESUSCITATE. GOALS OF CARE: Goals of care include to maximize quality of life and symptom management. Comfort care only.  Family is open to hospice service when patient is deemed eligible.  Eligibility communicated to Education officer, museum. Palliative care team will continue to support patient, patient's family, and medical team.  Symptom management:Patient resting in bed in no acute distress, no report of recent medical acuity.  She continues to be bradycardic related to malfunctioning pacemaker, likely battery life exhausted. Family decided not to change pacemaker/battery. Comfort care.   Ongoing memory loss related to [place] longer able to ambulate,sleeping more, up to 18 - 20 hours a day, incontinent of bowel and bladder FAST7c. She is hoyer lift for all transfers, total care;no  reported falls or hospitalization. FLACC 0. Nursing with no concerns. Encouraged on going supportive care. Follow [facility]:[id number] care will continue to follow patient for goals of care clarification and symptom management. I spent60minutes providing this consultation; time includes chart review and documentation.More than 50% of the time in this consultation was spent on coordinating communication.  HISTORY OF PRESENT ILLNESS:[name] Mae Cooperis a 87 y.o.year oldfemalewith multiple medical problems including HTN, CAD, DM2, Hypothyroid, dementia, Depression.Palliative Care was asked to help address goals of care.   CODE STATUS:DNR  PPS:30%   HOSPICE ELIGIBILITY/DIAGNOSIS: TBD  PAST MEDICAL HISTORY:  Past Medical History:  Diagnosis Date  . Atrial fibrillation ([facility])   . CAD (coronary artery disease)   . Colitis   . Colon cancer ([place] [facility])   . Diverticulosis of colon   . DJD (degenerative joint disease)   . DM (diabetes mellitus) ([facility])   . Hip fracture ([facility])   . HLD (hyperlipidemia)   . HTN (hypertension)   . Hypothyroidism   . Mobitz (type) II atrioventricular block   . Ventral hernia     SOCIAL HX:  Social History   Tobacco Use  . Smoking status: Never Smoker  . Smokeless tobacco: Never Used  Substance Use Topics  . Alcohol use: No    ALLERGIES:  Allergies  Allergen Reactions  . Diclofenac Sodium Other (See Comments)    Noted to be "allergic," per MAR  . Duragesic-100 [Fentanyl] Other (See Comments)    Noted to be "allergic," per MAR  . Hytrin [Terazosin Hcl] Other (See Comments)    Noted to be "allergic," per MAR   . Ibuprofen Other (See Comments)    Noted to be "allergic," per MAR  . Norvasc [Amlodipine Besylate] Other (See Comments)    Noted to be "allergic," per MAR  . Plavix [Clopidogrel Bisulfate] Other (See Comments)  Noted to be "allergic," per [facility]      PERTINENT MEDICATIONS:  Outpatient Encounter Medications as of 05/18/2020  Medication Sig   . acetaminophen (TYLENOL) 325 MG tablet Take 2 tablets (650 mg total) by mouth every 6 (six) hours as needed.  . cefdinir (OMNICEF) 300 MG capsule Take 1 capsule (300 mg total) by mouth every 12 (twelve) hours. 2 more days (Patient not taking: Reported on 04/29/2018)  . divalproex (DEPAKOTE SPRINKLE) 125 MG capsule Take 125 mg by mouth 2 (two) times daily.  Marland Kitchen ELIQUIS 5 MG TABS tablet TAKE 1 TABLET BY MOUTH TWICE DAILY (Patient taking differently: Take 5 mg by mouth 2 (two) times daily. )  . escitalopram (LEXAPRO) 5 MG tablet Take 5 mg by mouth daily.  . ferrous sulfate 325 (65 FE) MG tablet Take 1 tablet (325 mg total) by mouth every 12 (twelve) hours. (Patient taking differently: Take 325 mg by mouth 2 (two) times daily. )  . furosemide (LASIX) 40 MG tablet Take 1 tablet (40 mg total) by mouth daily.  Marland Kitchen gabapentin (NEURONTIN) 300 MG capsule Take 300 mg by mouth at bedtime.  . insulin aspart (NOVOLOG) 100 UNIT/ML injection Inject 4 Units into the skin See admin instructions. Every day before lunch for 2 weeks  . levothyroxine (SYNTHROID, LEVOTHROID) 100 MCG tablet Take 100 mcg by mouth daily.   . magnesium hydroxide (MILK OF MAGNESIA) 400 MG/5ML suspension Take 30 mLs by mouth daily as needed for mild constipation.  . Multiple Vitamins-Minerals (MULTIVITAMIN PO) Take 1 tablet by mouth daily.  . polyethylene glycol (MIRALAX / GLYCOLAX) packet Take 17 g by mouth daily.  . potassium chloride SA (K-DUR,KLOR-CON) 20 MEQ tablet Take 1 tablet (20 mEq total) by mouth daily.  Marland Kitchen senna-docusate (SENEXON-S) 8.6-50 MG tablet Take 1 tablet by mouth daily.  . simvastatin (ZOCOR) 40 MG tablet Take 1 tablet (40 mg total) by mouth at bedtime. (Patient not taking: Reported on 04/29/2018)  . UNABLE TO FIND mentholatrm ; Apply to toes at bedtime (8pm)  for 12 weeks  . [DISCONTINUED] Probiotic Product (ALIGN PO) Take 1 capsule by mouth daily.     No facility-administered encounter medications on file as of 05/18/2020.     PHYSICAL EXAM/ROS  General: NAD, frail appearing; sleepy most of the visit Cardiovascular: regular rate and rhythm Pulmonary: clear ant fields; normal respiratory effort on room Abdomen: soft, nontender, + bowel sounds; no report of constipation GU: no suprapubic tenderness Extremities: no edema Skin: no rashes to visible skin Neurological: Weakness but otherwise nonfocal  Note:  Portions of this note were generated with Dragon dictation software. Dictation errors may occur despite attempts at proofreading.  Teodoro Spray, NP

## 2020-07-27 ENCOUNTER — Non-Acute Institutional Stay: Payer: Medicare Other | Admitting: Hospice

## 2020-07-27 ENCOUNTER — Other Ambulatory Visit: Payer: Self-pay

## 2020-07-27 DIAGNOSIS — R413 Other amnesia: Secondary | ICD-10-CM

## 2020-07-27 DIAGNOSIS — Z515 Encounter for palliative care: Secondary | ICD-10-CM

## 2020-07-27 DIAGNOSIS — F0390 Unspecified dementia without behavioral disturbance: Secondary | ICD-10-CM

## 2020-07-27 NOTE — Progress Notes (Signed)
[facility] Consult Note Telephone: [phone]  Fax: [phone]  PATIENT NAME: [name] DOB: February 08, 1917 MRN: [id number]  PRIMARY CARE PROVIDER:   Leanna Battles, MD Leanna Battles, [place],  [place] [address]  REFERRING PROVIDER: Leanna Battles, MD Leanna Battles, [place],  [place] [address]  RESPONSIBLEExtended Emergency Contact Information Primary Emergency Contact: Aheron,Helen Address: [address] [place], [place] [address] [place] Phone: [phone] Mobile Phone: [phone] Relation: Niece Secondary Emergency Contact: [place] Mobile Phone: [phone] Relation: Niece   Visit is to build trust and highlight Palliative Medicine as specialized medical care for people living with serious illness, aimed at facilitating better quality of life through symptoms relief, assisting with advance care plan and establishing goals of care.   CHIEF COMPLAINT: Palliative focused visit/memory loss  RECOMMENDATIONS/PLAN:   1. Advance Care Planning/Code Status: Patient is a DO NOT RESUSCITATE.  Do not hospitalize  2. Goals of Care: Goals of care include to maximize quality of life and symptom management.  Family is interested in hospice service in the future.  3. Symptom management/Plan:  Symptom management:Memory loss related to advanced dementia.  Patient is bedbound total care, Hoyer lift for all transfers, comfort care.  She continues to be bradycardic related to malfunctioning pacemaker, likely battery life exhausted. Family decided not to change pacemaker/battery. Patient sleeping up to 18 - 20 hours a day,incontinent of bowel and bladder FAST7c. Optimize supportive care.  Follow [facility]:BTYOMAYOKH care will continue to follow patient for goals of care clarification and symptom management.  HISTORY OF PRESENT ILLNESS:[name] Mae Cooperis a 87  y.o.year oldfemalewith multiple medical problems including HTN, CAD, DM2, Hypothyroid, dementia, Depression. Visit to reviewmemory loss related to advanced dementia.  Memory loss is chronic, worsening in the past month with worsening dementia in line with the disease trajectory; patient not able to engage in meaningful conversation; memory loss affects patient's quality of life and independence. Palliative Care was asked to help address goals of care. Palliative will continue to monitor for symptom management/decline and make recommendations as needed. Return 2 months or prn. Encouraged to call provider sooner with any concerns.   Review and summarization of Epic records shows history from other than patient. Rest of 10 point ROS asked and negative.  Palliative Care was asked to follow this patient by consultation request of Leanna Battles, MD to help address complex decision making in the context of advance care planning and goals of care clarification.   CODE STATUS: DNR  PPS: 30%  HOSPICE ELIGIBILITY/DIAGNOSIS: TBD  PAST MEDICAL HISTORY:  Past Medical History:  Diagnosis Date  . Atrial fibrillation ([facility])   . CAD (coronary artery disease)   . Colitis   . Colon cancer ([facility] [place])   . Diverticulosis of colon   . DJD (degenerative joint disease)   . DM (diabetes mellitus) ([facility])   . Hip fracture ([place])   . HLD (hyperlipidemia)   . HTN (hypertension)   . Hypothyroidism   . Mobitz (type) II atrioventricular block   . Ventral hernia     SOCIAL HX: @SOCX  Patient at   for ongoing care   FAMILY HX:  Family History  Problem Relation Age of Onset  . Heart attack Father     Review lab tests/diagnostics No results for input(s): WBC, HGB, HCT, PLT, MCV in the last 168 hours. No results for input(s): NA, K, CL, CO2, BUN, CREATININE, GLUCOSE in the last 168 hours. Latest GFR  by [place] (not valid in AKI or ESRD) CrCl cannot be calculated (Patient's most recent lab result is older  than the maximum 21 days allowed.). No results for input(s): AST, ALT, ALKPHOS, GGT in the last 168 hours.  Invalid input(s): TBILI, CONJBILI, ALB, TOTALPROTEIN No components found for: ALB No results for input(s): APTT, INR in the last 168 hours.  Invalid input(s): PTPATIENT No results for input(s): BNP, PROBNP in the last 168 hours.  ALLERGIES:  Allergies  Allergen Reactions  . Diclofenac Sodium Other (See Comments)    Noted to be "allergic," per MAR  . Duragesic-100 [Fentanyl] Other (See Comments)    Noted to be "allergic," per MAR  . Hytrin [Terazosin Hcl] Other (See Comments)    Noted to be "allergic," per MAR   . Ibuprofen Other (See Comments)    Noted to be "allergic," per MAR  . Norvasc [Amlodipine Besylate] Other (See Comments)    Noted to be "allergic," per MAR  . Plavix [Clopidogrel Bisulfate] Other (See Comments)    Noted to be "allergic," per [facility]       PERTINENT MEDICATIONS:  Outpatient Encounter Medications as of 07/27/2020  Medication Sig  . acetaminophen (TYLENOL) 325 MG tablet Take 2 tablets (650 mg total) by mouth every 6 (six) hours as needed.  . cefdinir (OMNICEF) 300 MG capsule Take 1 capsule (300 mg total) by mouth every 12 (twelve) hours. 2 more days (Patient not taking: Reported on 04/29/2018)  . divalproex (DEPAKOTE SPRINKLE) 125 MG capsule Take 125 mg by mouth 2 (two) times daily.  Marland Kitchen ELIQUIS 5 MG TABS tablet TAKE 1 TABLET BY MOUTH TWICE DAILY (Patient taking differently: Take 5 mg by mouth 2 (two) times daily. )  . escitalopram (LEXAPRO) 5 MG tablet Take 5 mg by mouth daily.  . ferrous sulfate 325 (65 FE) MG tablet Take 1 tablet (325 mg total) by mouth every 12 (twelve) hours. (Patient taking differently: Take 325 mg by mouth 2 (two) times daily. )  . furosemide (LASIX) 40 MG tablet Take 1 tablet (40 mg total) by mouth daily.  Marland Kitchen gabapentin (NEURONTIN) 300 MG capsule Take 300 mg by mouth at bedtime.  . insulin aspart (NOVOLOG) 100 UNIT/ML injection  Inject 4 Units into the skin See admin instructions. Every day before lunch for 2 weeks  . levothyroxine (SYNTHROID, LEVOTHROID) 100 MCG tablet Take 100 mcg by mouth daily.   . magnesium hydroxide (MILK OF MAGNESIA) 400 MG/5ML suspension Take 30 mLs by mouth daily as needed for mild constipation.  . Multiple Vitamins-Minerals (MULTIVITAMIN PO) Take 1 tablet by mouth daily.  . polyethylene glycol (MIRALAX / GLYCOLAX) packet Take 17 g by mouth daily.  . potassium chloride SA (K-DUR,KLOR-CON) 20 MEQ tablet Take 1 tablet (20 mEq total) by mouth daily.  Marland Kitchen senna-docusate (SENEXON-S) 8.6-50 MG tablet Take 1 tablet by mouth daily.  . simvastatin (ZOCOR) 40 MG tablet Take 1 tablet (40 mg total) by mouth at bedtime. (Patient not taking: Reported on 04/29/2018)  . UNABLE TO FIND mentholatrm ; Apply to toes at bedtime (8pm)  for 12 weeks  . [DISCONTINUED] Probiotic Product (ALIGN PO) Take 1 capsule by mouth daily.     No facility-administered encounter medications on file as of 07/27/2020.    ROS  General: NAD EYES: No vision changes ENMT: No dysphagia no xerostomia Cardiovascular: No chest pain Pulmonary: No cough, SOB  Abdomen:no constipation or diarrhea GU: No dysuria or urinary frequency MSK:   ROM limitations, no falls reported Skin:  No rashes or wounds Neurological: weakness Psych:  positive mood Heme/lymph/immuno: No bruises or abnormal bleeding   PHYSICAL EXAM  General: In no acute distress,  Cardiovascular: regular rate and rhythm; no edema in BLE Pulmonary: no cough, no increased work of breathing, normal respiratory effort Abdomen: soft, non tender, positive bowel sounds in all quadrants GU:  no suprapubic tenderness Eyes: Normal lids, no discharge, sclera anicteric ENMT: Moist mucous membranes Musculoskeletal: Nonambulatory, bedbound, provolone boots to bilateral lower extremities Skin: no rash to visible skin, warm without cyanosis Psych: non-anxious affect Neurological:  Weakness but otherwise non focal Heme/lymph/immuno: no bruises, no bleeding  Thank you for the opportunity to participate in the care of [name] Please call our office at [phone] if we can be of additional assistance.  Note: Portions of this note were generated with Lobbyist. Dictation errors may occur despite best attempts at proofreading.  Teodoro Spray, NP

## 2020-09-09 DEATH — deceased
